# Patient Record
Sex: Male | Born: 1970 | Race: Black or African American | Hispanic: No | Marital: Married | Smoking: Current every day smoker
Health system: Southern US, Community
[De-identification: ages and names within clinical notes are randomized; demographics above are authoritative.]

## PROBLEM LIST (undated history)

## (undated) DIAGNOSIS — K289 Gastrojejunal ulcer, unspecified as acute or chronic, without hemorrhage or perforation: Secondary | ICD-10-CM

## (undated) DIAGNOSIS — M5430 Sciatica, unspecified side: Secondary | ICD-10-CM

## (undated) DIAGNOSIS — C801 Malignant (primary) neoplasm, unspecified: Secondary | ICD-10-CM

## (undated) DIAGNOSIS — F419 Anxiety disorder, unspecified: Secondary | ICD-10-CM

## (undated) HISTORY — PX: NO PAST SURGERIES: SHX2092

---

## 2016-03-27 ENCOUNTER — Encounter (HOSPITAL_COMMUNITY): Payer: Self-pay | Admitting: Emergency Medicine

## 2016-03-27 ENCOUNTER — Emergency Department (HOSPITAL_COMMUNITY)
Admission: EM | Admit: 2016-03-27 | Discharge: 2016-03-27 | Disposition: A | Discharge status: Home / Self Care | Payer: Self-pay | Attending: Emergency Medicine | Admitting: Emergency Medicine

## 2016-03-27 DIAGNOSIS — M5431 Sciatica, right side: Secondary | ICD-10-CM | POA: Insufficient documentation

## 2016-03-27 DIAGNOSIS — F17200 Nicotine dependence, unspecified, uncomplicated: Secondary | ICD-10-CM | POA: Insufficient documentation

## 2016-03-27 HISTORY — DX: Sciatica, unspecified side: M54.30

## 2016-03-27 HISTORY — DX: Gastrojejunal ulcer, unspecified as acute or chronic, without hemorrhage or perforation: K28.9

## 2016-03-27 MED ORDER — TRAMADOL HCL 50 MG PO TABS: 50.0000 mg | ORAL_TABLET | Freq: Four times a day (QID) | ORAL | 0 refills | Status: DC | PRN

## 2016-03-27 MED ORDER — PREDNISONE 20 MG PO TABS: 20.0000 mg | ORAL_TABLET | Freq: Every day | ORAL | 0 refills | Status: DC

## 2016-03-27 MED ORDER — PREDNISONE 20 MG PO TABS: 40.0000 mg | ORAL_TABLET | Freq: Every day | ORAL | 0 refills | Status: DC

## 2016-03-27 NOTE — ED Provider Notes (Signed)
[facility] DEPT Provider Note   CSN: [id number] Arrival date & time: 03/27/16  0930     History   Chief Complaint Chief Complaint  Patient presents with  . Back Pain    HPI Justin Randall is a 45 y.o. male.  HPI   45 year old male presents today with complaints of back pain. Patient reports a chronic history of lower back pain. Patient states he recently moved here from [place] approximately 3 weeks ago. He notes approximately one week ago started to have radiation of his low back pain into his right posterior leg. He reports symptoms radiate down into his posterior thigh just above his knee. He reports pain is significantly worse with flexing the hip, extending the leg. He notes this comes and goes, denies any focal neurological deficits, saddle anesthesia, changes in bowel or bladder habits, fever, IV drug use, weight loss, trauma, or any other back pain red flags. Patient reports he's been seen in the emergency room in [place], no follow-up with neurosurgery orthopedics or primary care for this. Patient has been trying ibuprofen and Tylenol with no significant relief symptoms.  Past Medical History:  Diagnosis Date  . Sciatica   . Ulcer of the stomach and intestine     There are no active problems to display for this patient.   History reviewed. No pertinent surgical history.    Home Medications    Prior to Admission medications   Medication Sig Start Date End Date Taking? Authorizing Provider  predniSONE (DELTASONE) 20 MG tablet Take 1 tablet (20 mg total) by mouth daily with breakfast. 03/27/16   Okey Regal, PA-C  predniSONE (DELTASONE) 20 MG tablet Take 2 tablets (40 mg total) by mouth daily with breakfast. 03/27/16   Okey Regal, PA-C  traMADol (ULTRAM) 50 MG tablet Take 1 tablet (50 mg total) by mouth every 6 (six) hours as needed. 03/27/16   Okey Regal, PA-C    Family History No family history on file.  Social History Social History  Substance Use Topics    . Smoking status: Current Every Day Smoker    Packs/day: 1.00  . Smokeless tobacco: Never Used  . Alcohol use No     Allergies   Review of patient's allergies indicates no known allergies.   Review of Systems Review of Systems  All other systems reviewed and are negative.  Physical Exam Updated Vital Signs BP 102/74 (BP Location: Left Arm)   Pulse 62   Temp 97.5 F (36.4 C) (Oral)   Resp 17   Ht '6\' 2"'$  (1.88 m)   Wt 79.4 kg   SpO2 97%   BMI 22.47 kg/m   Physical Exam  Constitutional: He is oriented to person, place, and time. He appears well-developed and well-nourished. No distress.  HENT:  Head: Normocephalic.  Neck: Normal range of motion. Neck supple.  Pulmonary/Chest: Effort normal.  Musculoskeletal: Normal range of motion. He exhibits tenderness. He exhibits no edema.  No C, T, or L spine tenderness to palpation. No obvious signs of trauma, deformity, infection, step-offs. Lung expansion normal. No scoliosis or kyphosis. Bilateral lower extremity strength 5 out of 5, sensation grossly intact, patellar reflexes 2+, pedal pulses 2+Straight leg positive  Ambulates with normal difficulty   Neurological: He is alert and oriented to person, place, and time.  Skin: Skin is warm and dry. He is not diaphoretic.  Psychiatric: He has a normal mood and affect. His behavior is normal. Judgment and thought content normal.  Nursing note and vitals reviewed.  ED Treatments / Results  Labs (all labs ordered are listed, but only abnormal results are displayed) Labs Reviewed - No data to display  EKG  EKG Interpretation None       Radiology No results found.  Procedures Procedures (including critical care time)  Medications Ordered in ED Medications - No data to display   Initial Impression / Assessment and Plan / ED Course  I have reviewed the triage vital signs and the nursing notes.  Pertinent labs & imaging results that were available during my care of  the patient were reviewed by me and considered in my medical decision making (see chart for details).  Clinical Course    Final Clinical Impressions(s) / ED Diagnoses   Final diagnoses:  Sciatica of right side   Labs:  Imaging:  Consults:  Therapeutics:  Discharge Meds:   Assessment/Plan:   45 year old male presents today with acute on chronic back pain; no red flags She does have sciatic symptoms, no neurological deficits. Patient will need neurosurgery follow-up. He'll be treated with above medications, he will be given strict return precautions. Patient verbalized understanding and agreement to today's plan had no further questions or concerns at time of discharge    New Prescriptions Discharge Medication List as of 03/27/2016 11:02 AM    START taking these medications   Details  !! predniSONE (DELTASONE) 20 MG tablet Take 1 tablet (20 mg total) by mouth daily with breakfast., Starting Wed 03/27/2016, Print    !! predniSONE (DELTASONE) 20 MG tablet Take 2 tablets (40 mg total) by mouth daily with breakfast., Starting Wed 03/27/2016, Print     !! - Potential duplicate medications found. Please discuss with provider.       Okey Regal, PA-C 03/27/16 1627    Tanna Furry, MD 04/02/16 [phone]

## 2016-03-27 NOTE — Discharge Instructions (Signed)
Please read attached information. If you experience any new or worsening signs or symptoms please return to the emergency room for evaluation. Please follow-up with your primary care provider or specialist as discussed. Please use medication prescribed only as directed and discontinue taking if you have any concerning signs or symptoms.   °

## 2016-03-27 NOTE — ED Triage Notes (Signed)
Pt c/o "sciatica pain" has hx of same-- radiates from right buttock to right knee. Pain with walking, and constant pain-- starts a new job Monday -- wants to be able to go to work.

## 2016-04-18 ENCOUNTER — Encounter (HOSPITAL_COMMUNITY): Payer: Self-pay | Admitting: *Deleted

## 2016-04-18 ENCOUNTER — Emergency Department (HOSPITAL_COMMUNITY)
Admission: EM | Admit: 2016-04-18 | Discharge: 2016-04-18 | Disposition: A | Discharge status: Home / Self Care | Payer: Self-pay | Attending: Emergency Medicine | Admitting: Emergency Medicine

## 2016-04-18 DIAGNOSIS — G8929 Other chronic pain: Secondary | ICD-10-CM | POA: Insufficient documentation

## 2016-04-18 DIAGNOSIS — F17200 Nicotine dependence, unspecified, uncomplicated: Secondary | ICD-10-CM | POA: Insufficient documentation

## 2016-04-18 DIAGNOSIS — M5431 Sciatica, right side: Secondary | ICD-10-CM | POA: Insufficient documentation

## 2016-04-18 MED ORDER — PREDNISONE 10 MG PO TABS: 20.0000 mg | ORAL_TABLET | Freq: Two times a day (BID) | ORAL | 0 refills | Status: DC

## 2016-04-18 MED ORDER — HYDROCODONE-ACETAMINOPHEN 5-325 MG PO TABS: 1.0000 | ORAL_TABLET | Freq: Four times a day (QID) | ORAL | 0 refills | Status: DC | PRN

## 2016-04-18 MED ORDER — METHOCARBAMOL 500 MG PO TABS: 500.0000 mg | ORAL_TABLET | Freq: Two times a day (BID) | ORAL | 0 refills | Status: DC

## 2016-04-18 MED ORDER — NAPROXEN 500 MG PO TABS: 500.0000 mg | ORAL_TABLET | Freq: Two times a day (BID) | ORAL | 0 refills | Status: DC

## 2016-04-18 MED ORDER — LIDOCAINE 5 % EX PTCH: 1.0000 | MEDICATED_PATCH | CUTANEOUS | 0 refills | Status: DC

## 2016-04-18 NOTE — ED Triage Notes (Signed)
Pt states R leg pain same as previous sciatica pain.

## 2016-04-18 NOTE — Discharge Instructions (Signed)
Take it easy, but do not lay around too much as this may make the stiffness worse. Take 500 mg of naproxen every 12 hours or 800 mg of ibuprofen every 8 hours for the next 3 days. Take these medications with food to avoid upset stomach. Robaxin is a muscle relaxer and may help loosen stiff muscles. Percocet for severe pain. Do not take the Robaxin or Percocet while driving or performing other dangerous activities. Follow-up with orthopedics as soon as possible.

## 2016-04-18 NOTE — ED Provider Notes (Signed)
[facility] DEPT Provider Note   CSN: [id number] Arrival date & time: 04/18/16  1112  By signing my name below, I, [name], attest that this documentation has been prepared under the direction and in the presence of Shawn C. Joy, PA-C. Electronically Signed: [name], ED Scribe. 04/18/16. 12:14 PM.   History   Chief Complaint Chief Complaint  Patient presents with  . Leg Pain    HPI HPI Comments: Justin Randall is a 45 y.o. male with a h/o sciatica who presents to the Emergency Department complaining of worsening, moderate, right hip pain onset a few days ago. He states that he has a h/o sciatica which typically presents similarly to his current pain, but is radiating into his right posterior leg more than usual. His pain worsens with movement and certain positions. He has taken Aleve w/o significant relief. Pt has not been evaluated by an orthopaedist for his chronic sciatica. Fever/chills, abdominal pain, neuro deficits, changes in bowel or bladder function, or any other associated symptoms at this time.    The history is provided by the patient. No language interpreter was used.    Past Medical History:  Diagnosis Date  . Sciatica   . Ulcer of the stomach and intestine     There are no active problems to display for this patient.   History reviewed. No pertinent surgical history.   Home Medications    Prior to Admission medications   Medication Sig Start Date End Date Taking? Authorizing Provider  HYDROcodone-acetaminophen (NORCO/VICODIN) 5-325 MG tablet Take 1 tablet by mouth every 6 (six) hours as needed. 04/18/16   Shawn C Joy, PA-C  lidocaine (LIDODERM) 5 % Place 1 patch onto the skin daily. Remove & Discard patch within 12 hours or as directed by MD 04/18/16   Lorayne Bender, PA-C  methocarbamol (ROBAXIN) 500 MG tablet Take 1 tablet (500 mg total) by mouth 2 (two) times daily. 04/18/16   Shawn C Joy, PA-C  naproxen (NAPROSYN) 500 MG tablet Take 1 tablet (500  mg total) by mouth 2 (two) times daily. 04/18/16   Shawn C Joy, PA-C  predniSONE (DELTASONE) 10 MG tablet Take 2 tablets (20 mg total) by mouth 2 (two) times daily with a meal. 04/18/16   Shawn C Joy, PA-C  traMADol (ULTRAM) 50 MG tablet Take 1 tablet (50 mg total) by mouth every 6 (six) hours as needed. 03/27/16   Okey Regal, PA-C    Family History No family history on file.  Social History Social History  Substance Use Topics  . Smoking status: Current Every Day Smoker    Packs/day: 1.00  . Smokeless tobacco: Never Used  . Alcohol use No     Allergies   Review of patient's allergies indicates no known allergies.   Review of Systems Review of Systems  Musculoskeletal: Positive for arthralgias and myalgias.  Skin: Negative for color change and wound.  Neurological: Negative for weakness and numbness.  All other systems reviewed and are negative.  Physical Exam Updated Vital Signs BP 123/78   Pulse 86   Temp 97.7 F (36.5 C) (Oral)   Resp 18   Ht '6\' 2"'$  (1.88 m)   Wt 79.4 kg   SpO2 98%   BMI 22.47 kg/m   Physical Exam  Constitutional: He appears well-developed and well-nourished. No distress.  HENT:  Head: Normocephalic and atraumatic.  Eyes: Conjunctivae are normal.  Neck: Neck supple.  Cardiovascular: Normal rate, regular rhythm and intact distal pulses.   Pulmonary/Chest:  Effort normal. No respiratory distress.  Abdominal: Soft. There is no tenderness. There is no guarding.  Musculoskeletal: Normal range of motion. He exhibits tenderness. He exhibits no edema.  TTP to posterior right leg and hip. Pain intensified with external rotation of the right hip. Full ROM in all extremities and spine. No midline spinal tenderness.   Lymphadenopathy:    He has no cervical adenopathy.  Neurological: He is alert. He has normal strength. No sensory deficit.  No sensory deficits. Strength 5/5 in all extremities. No gait disturbance. Coordination intact.   Skin: Skin is  warm and dry. He is not diaphoretic.  Psychiatric: He has a normal mood and affect. His behavior is normal.  Nursing note and vitals reviewed.   ED Treatments / Results  Labs (all labs ordered are listed, but only abnormal results are displayed) Labs Reviewed - No data to display  EKG  EKG Interpretation None       Radiology No results found.  Procedures Procedures  DIAGNOSTIC STUDIES: Oxygen Saturation is 98% on RA, normal by my interpretation.    COORDINATION OF CARE: 12:09 PM Discussed next steps with pt. Pt verbalized understanding and is agreeable with the plan.    Medications Ordered in ED Medications - No data to display   Initial Impression / Assessment and Plan / ED Course  I have reviewed the triage vital signs and the nursing notes.  Pertinent labs & imaging results that were available during my care of the patient were reviewed by me and considered in my medical decision making (see chart for details).  Clinical Course    Patient with chronic back pain.  No neuro or functional deficits. Patient is ambulatory.  No changes in bowel or bladder control.  No evidence of cauda equina.  No fever, night sweats, weight loss, h/o cancer, IVDA, no recent procedure to back. No urinary symptoms suggestive of UTI.  Supportive care and return precaution discussed. Appears safe for discharge at this time. Patient to follow-up with orthopedics. Resources given.   I personally performed the services described in this documentation, which was scribed in my presence. The recorded information has been reviewed and is accurate.  Final Clinical Impressions(s) / ED Diagnoses   Final diagnoses:  Sciatica of right side    New Prescriptions Discharge Medication List as of 04/18/2016 12:14 PM    START taking these medications   Details  HYDROcodone-acetaminophen (NORCO/VICODIN) 5-325 MG tablet Take 1 tablet by mouth every 6 (six) hours as needed., Starting Thu 04/18/2016, Print      lidocaine (LIDODERM) 5 % Place 1 patch onto the skin daily. Remove & Discard patch within 12 hours or as directed by MD, Starting Thu 04/18/2016, Print    methocarbamol (ROBAXIN) 500 MG tablet Take 1 tablet (500 mg total) by mouth 2 (two) times daily., Starting Thu 04/18/2016, Print    naproxen (NAPROSYN) 500 MG tablet Take 1 tablet (500 mg total) by mouth 2 (two) times daily., Starting Thu 04/18/2016, Print         Lorayne Bender, PA-C 04/20/16 [place], MD 04/21/16 1537

## 2016-05-30 ENCOUNTER — Emergency Department (HOSPITAL_COMMUNITY)
Admission: EM | Admit: 2016-05-30 | Discharge: 2016-05-30 | Disposition: A | Discharge status: Home / Self Care | Payer: Self-pay | Attending: Emergency Medicine | Admitting: Emergency Medicine

## 2016-05-30 ENCOUNTER — Emergency Department (HOSPITAL_COMMUNITY): Payer: Self-pay

## 2016-05-30 ENCOUNTER — Encounter (HOSPITAL_COMMUNITY): Payer: Self-pay

## 2016-05-30 DIAGNOSIS — F17200 Nicotine dependence, unspecified, uncomplicated: Secondary | ICD-10-CM | POA: Insufficient documentation

## 2016-05-30 DIAGNOSIS — M5416 Radiculopathy, lumbar region: Secondary | ICD-10-CM | POA: Insufficient documentation

## 2016-05-30 MED ORDER — HYDROCODONE-ACETAMINOPHEN 5-325 MG PO TABS: 1.0000 | ORAL_TABLET | Freq: Four times a day (QID) | ORAL | 0 refills | Status: DC | PRN

## 2016-05-30 MED ORDER — KETOROLAC TROMETHAMINE 60 MG/2ML IM SOLN
30.0000 mg | Freq: Once | INTRAMUSCULAR | Status: AC
  Administered 2016-05-30: 30 mg via INTRAMUSCULAR
  Filled 2016-05-30: qty 2

## 2016-05-30 MED ORDER — METHOCARBAMOL 500 MG PO TABS: 500.0000 mg | ORAL_TABLET | Freq: Two times a day (BID) | ORAL | 0 refills | Status: DC

## 2016-05-30 MED ORDER — NAPROXEN 500 MG PO TABS: 500.0000 mg | ORAL_TABLET | Freq: Two times a day (BID) | ORAL | 0 refills | Status: DC

## 2016-05-30 MED ORDER — PREDNISONE 10 MG (21) PO TBPK: 10.0000 mg | ORAL_TABLET | Freq: Every day | ORAL | 0 refills | Status: DC

## 2016-05-30 NOTE — ED Triage Notes (Signed)
Patient complains of ongoing lower back pain with radiation down right leg for months, has been seen in ED for same and taken prescriptions as prescribed. Some of the meds he couldn't afford so never purchased.
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
## 2016-05-30 NOTE — Discharge Instructions (Signed)
Naproxen for pain and inflammation. Norco for severe pain only. Take prednisone as prescribed until all gone. Take Robaxin for muscle spasms. Start doing back exercises as described below. Follow-up with a primary care doctor.

## 2016-05-30 NOTE — ED Notes (Signed)
Papers and prescriptions reviewed and pt. Verbalizes understanding.

## 2016-06-14 ENCOUNTER — Emergency Department (HOSPITAL_COMMUNITY)
Admission: EM | Admit: 2016-06-14 | Discharge: 2016-06-14 | Disposition: A | Discharge status: Home / Self Care | Payer: Self-pay | Attending: Emergency Medicine | Admitting: Emergency Medicine

## 2016-06-14 ENCOUNTER — Encounter (HOSPITAL_COMMUNITY): Payer: Self-pay | Admitting: *Deleted

## 2016-06-14 DIAGNOSIS — F17200 Nicotine dependence, unspecified, uncomplicated: Secondary | ICD-10-CM | POA: Insufficient documentation

## 2016-06-14 DIAGNOSIS — M79604 Pain in right leg: Secondary | ICD-10-CM | POA: Insufficient documentation

## 2016-06-14 DIAGNOSIS — M25551 Pain in right hip: Secondary | ICD-10-CM | POA: Insufficient documentation

## 2016-06-14 MED ORDER — PREDNISONE 10 MG (21) PO TBPK: 10.0000 mg | ORAL_TABLET | Freq: Every day | ORAL | 0 refills | Status: DC

## 2016-06-14 MED ORDER — METHYLPREDNISOLONE SODIUM SUCC 125 MG IJ SOLR: 125.0000 mg | Freq: Once | INTRAMUSCULAR | Status: DC

## 2016-06-14 MED ORDER — CYCLOBENZAPRINE HCL 10 MG PO TABS: 10.0000 mg | ORAL_TABLET | Freq: Three times a day (TID) | ORAL | 0 refills | Status: DC | PRN

## 2016-06-14 MED ORDER — IBUPROFEN 800 MG PO TABS: 800.0000 mg | ORAL_TABLET | Freq: Three times a day (TID) | ORAL | 0 refills | Status: DC

## 2016-06-14 MED ORDER — KETOROLAC TROMETHAMINE 60 MG/2ML IM SOLN
60.0000 mg | Freq: Once | INTRAMUSCULAR | Status: AC
  Administered 2016-06-14: 60 mg via INTRAMUSCULAR
  Filled 2016-06-14: qty 2

## 2016-06-14 MED ORDER — METHYLPREDNISOLONE SODIUM SUCC 125 MG IJ SOLR
125.0000 mg | Freq: Once | INTRAMUSCULAR | Status: AC
  Administered 2016-06-14: 125 mg via INTRAMUSCULAR
  Filled 2016-06-14: qty 2

## 2016-06-14 NOTE — ED Provider Notes (Signed)
[facility] DEPT Provider Note   CSN: [id number] Arrival date & time: 06/14/16  1022  By signing my name below, I, Justin Randall, attest that this documentation has been prepared under the direction and in the presence of [employer], PA-C.  Electronically Signed: Rayna Randall, ED Scribe. 06/14/16. 11:02 AM.   History   Chief Complaint Chief Complaint  Patient presents with  . Leg Pain  . Back Pain    HPI HPI Comments: Justin Randall is a 45 y.o. male who presents to the Emergency Department complaining of chronic, worsening, sharp, right lower back painAnd hip pain onset years ago. Pt was seen on 05/30/2016 for sciatic back pain and d/c with a course of prednisone which provided mild short term relief. He states his pain has progressively worsened and states it now radiates down his RLE and is a constant pain which is abnormal for his chronic symptoms. Pt reports associated sleep disturbance due to his pain. He has taken tylenol and ibuprofen w/o significant relief. Pt is currently unemployed and does not have insurance coverage and notes he cannot fill his prescriptions due to a lack of funds. He has taken Flexeril in the past which he states provides significant relief of his symptoms. Pt states he ambulates with a cane but did ambulate in the ED w/o a cane. Pt is a smoker. He denies a h/o IVDA or CA.  He denies bowel or bladder incontinence, urinary symptoms, CP, SOB, recent weight changes, abdominal pain, nausea, vomiting and saddle anaesthesia.   The history is provided by the patient. No language interpreter was used.    Past Medical History:  Diagnosis Date  . Sciatica   . Ulcer of the stomach and intestine     There are no active problems to display for this patient.   History reviewed. No pertinent surgical history.     Home Medications    Prior to Admission medications   Medication Sig Start Date End Date Taking? Authorizing Provider  cyclobenzaprine  (FLEXERIL) 10 MG tablet Take 1 tablet (10 mg total) by mouth 3 (three) times daily as needed for muscle spasms. 06/14/16   Frederica Kuster, PA-C  HYDROcodone-acetaminophen (NORCO) 5-325 MG tablet Take 1 tablet by mouth every 6 (six) hours as needed for moderate pain. 05/30/16   Tatyana Kirichenko, PA-C  ibuprofen (ADVIL,MOTRIN) 800 MG tablet Take 1 tablet (800 mg total) by mouth 3 (three) times daily. 06/14/16   Wanell Lorenzi M Colonel Krauser, PA-C  lidocaine (LIDODERM) 5 % Place 1 patch onto the skin daily. Remove & Discard patch within 12 hours or as directed by MD 04/18/16   Lorayne Bender, PA-C  methocarbamol (ROBAXIN) 500 MG tablet Take 1 tablet (500 mg total) by mouth 2 (two) times daily. 05/30/16   Tatyana Kirichenko, PA-C  naproxen (NAPROSYN) 500 MG tablet Take 1 tablet (500 mg total) by mouth 2 (two) times daily. 05/30/16   Tatyana Kirichenko, PA-C  predniSONE (STERAPRED UNI-PAK 21 TAB) 10 MG (21) TBPK tablet Take 1 tablet (10 mg total) by mouth daily. Take 6 tabs by mouth daily  for 2 days, then 5 tabs for 2 days, then 4 tabs for 2 days, then 3 tabs for 2 days, 2 tabs for 2 days, then 1 tab by mouth daily for 2 days 06/14/16   Bea Graff Daryll Spisak, PA-C  traMADol (ULTRAM) 50 MG tablet Take 1 tablet (50 mg total) by mouth every 6 (six) hours as needed. 03/27/16   Okey Regal, PA-C  Family History No family history on file.  Social History Social History  Substance Use Topics  . Smoking status: Current Every Day Smoker    Packs/day: 1.00  . Smokeless tobacco: Never Used  . Alcohol use No     Allergies   Patient has no known allergies.   Review of Systems Review of Systems  Constitutional: Negative for chills, fever and unexpected weight change.  Respiratory: Negative for shortness of breath.   Cardiovascular: Negative for chest pain.  Gastrointestinal: Negative for abdominal pain, nausea and vomiting.  Genitourinary: Negative for difficulty urinating, dysuria, frequency and hematuria.    Musculoskeletal: Positive for arthralgias, back pain and myalgias.  Neurological: Negative for numbness.  Psychiatric/Behavioral: Positive for sleep disturbance.    Physical Exam Updated Vital Signs BP 125/100 (BP Location: Right Arm)   Pulse 79   Temp 98.3 F (36.8 C) (Oral)   Resp 18   Ht '6\' 2"'$  (1.88 m)   Wt 79.4 kg   SpO2 99%   BMI 22.47 kg/m   Physical Exam  Constitutional: He appears well-developed and well-nourished. No distress.  HENT:  Head: Normocephalic and atraumatic.  Mouth/Throat: Oropharynx is clear and moist. No oropharyngeal exudate.  Eyes: Conjunctivae are normal. Pupils are equal, round, and reactive to light. Right eye exhibits no discharge. Left eye exhibits no discharge. No scleral icterus.  Neck: Normal range of motion. Neck supple. No thyromegaly present.  Cardiovascular: Normal rate, regular rhythm, normal heart sounds and intact distal pulses.  Exam reveals no gallop and no friction rub.   No murmur heard. Pulmonary/Chest: Effort normal and breath sounds normal. No stridor. No respiratory distress. He has no wheezes. He has no rales.  Abdominal: Soft. Bowel sounds are normal. He exhibits no distension. There is no tenderness. There is no rebound and no guarding.  Musculoskeletal: He exhibits tenderness. He exhibits no edema.       Right hip: He exhibits tenderness. He exhibits no bony tenderness.       Legs: R LE: Tenderness to right hip over gluteal muscle, no anterior hip tenderness; no tenderness to palpation to lower extremity; no edema, warmth, or varicosities present; lower extremities are symmetrical; normal sensation, 5/5 strength lower extremities; patient is ambulatory  Lymphadenopathy:    He has no cervical adenopathy.  Neurological: He is alert. Coordination normal.  Skin: Skin is warm and dry. No rash noted. He is not diaphoretic. No pallor.  Psychiatric: He has a normal mood and affect.  Nursing note and vitals reviewed.  ED Treatments  / Results  Labs (all labs ordered are listed, but only abnormal results are displayed) Labs Reviewed - No data to display  EKG  EKG Interpretation None       Radiology No results found.  Procedures Procedures  DIAGNOSTIC STUDIES: Oxygen Saturation is 99% on RA, normal by my interpretation.    COORDINATION OF CARE: 11:02 AM Discussed next steps with pt. Pt verbalized understanding and is agreeable with the plan.    Medications Ordered in ED Medications  ketorolac (TORADOL) injection 60 mg (60 mg Intramuscular Given 06/14/16 1116)  methylPREDNISolone sodium succinate (SOLU-MEDROL) 125 mg/2 mL injection 125 mg (125 mg Intramuscular Given 06/14/16 1117)     Initial Impression / Assessment and Plan / ED Course  I have reviewed the triage vital signs and the nursing notes.  Pertinent labs & imaging results that were available during my care of the patient were reviewed by me and considered in my medical decision making (see  chart for details).  Clinical Course     Well's Score -2. Doubt DVT as symptoms are chronic and unprovoked by palpation. Only present with ambulation and putting weight on the leg. No neurological deficits and normal neuro exam.  Patient is ambulatory.  No loss of bowel or bladder control.  No concern for cauda equina.  No fever, night sweats, weight loss, h/o cancer, IVDA, no recent procedure to back. No urinary symptoms suggestive of UTI.  Patient discharged home with Flexeril, ibuprofen, Sterapred taper. Patient advised not to begin ibuprofen and Sterapred until tomorrow, as patient received Toradol and Solu-Medrol in the ED. Supportive care and return precaution discussed. I consulted case management to arrange for patient to follow up at [facility] for primary care, pharmacy, and financial help with orange card. Patient also given follow-up to orthopedics when his orange card allows. Patient understands and agrees with plan. Appears safe for  discharge at this time.   I personally performed the services described in this documentation, which was scribed in my presence. The recorded information has been reviewed and is accurate.  Final Clinical Impressions(s) / ED Diagnoses   Final diagnoses:  Right hip pain  Right leg pain    New Prescriptions Discharge Medication List as of 06/14/2016 11:51 AM    START taking these medications   Details  cyclobenzaprine (FLEXERIL) 10 MG tablet Take 1 tablet (10 mg total) by mouth 3 (three) times daily as needed for muscle spasms., Starting Fri 06/14/2016, Print    ibuprofen (ADVIL,MOTRIN) 800 MG tablet Take 1 tablet (800 mg total) by mouth 3 (three) times daily., Starting Fri 06/14/2016, Print         Frederica Kuster, PA-C 06/14/16 [place], MD 06/15/16 [phone]

## 2016-06-14 NOTE — ED Triage Notes (Signed)
Pt states hx of sciatica and lower back pain.  States seen here 3 weeks ago and was tx with muscle relaxers.  This pain is different. Pt states that last time he was here he was accused of abusing opiates and he found it quite offensive.

## 2016-06-14 NOTE — Discharge Instructions (Signed)
Medications: Prednisone, ibuprofen, Flexeril  Treatment: Take prednisone as prescribed. Take ibuprofen every 8 hours as needed for pain. Do not begin taking prednisone or ibuprofen until tomorrow. Take Flexeril 3 times daily as needed for muscle pain and spasms. Use moist heat and ice alternating 20 minutes on, 20 minutes off.  Follow-up: Please follow-up with [facility] as outlined above to establish care with a primary care provider, help with pharmacy and your [facility] card. Please follow-up with the orthopedic doctor, Dr. Erlinda Hong, for further evaluation and treatment of your symptoms. Please return to the emergency department if you develop any new or worsening symptoms.

## 2016-06-17 MED FILL — IBUPROFEN 800 MG TABLET: 800 | 7 days supply | Qty: 21 | Fill #0

## 2016-06-17 MED FILL — ?CYCLOBENZAPRINE 10 MG TABL: 10 | 7 days supply | Qty: 21 | Fill #0

## 2016-06-17 MED FILL — ?PREDNISONE 10 MG TABLET: 10 | 12 days supply | Qty: 42 | Fill #0

## 2016-06-24 ENCOUNTER — Ambulatory Visit: Payer: Self-pay | Attending: Internal Medicine | Admitting: Physician Assistant

## 2016-06-24 ENCOUNTER — Encounter: Payer: Self-pay | Admitting: Physician Assistant

## 2016-06-24 VITALS — BP 131/87 | HR 77 | Temp 98.1°F | Resp 16 | Wt 190.0 lb

## 2016-06-24 DIAGNOSIS — G8929 Other chronic pain: Secondary | ICD-10-CM

## 2016-06-24 DIAGNOSIS — R202 Paresthesia of skin: Secondary | ICD-10-CM | POA: Insufficient documentation

## 2016-06-24 DIAGNOSIS — Z5189 Encounter for other specified aftercare: Secondary | ICD-10-CM | POA: Insufficient documentation

## 2016-06-24 DIAGNOSIS — M62838 Other muscle spasm: Secondary | ICD-10-CM | POA: Insufficient documentation

## 2016-06-24 DIAGNOSIS — M5440 Lumbago with sciatica, unspecified side: Secondary | ICD-10-CM

## 2016-06-24 DIAGNOSIS — M5441 Lumbago with sciatica, right side: Secondary | ICD-10-CM | POA: Insufficient documentation

## 2016-06-24 MED ORDER — METHOCARBAMOL 500 MG PO TABS: 1000.0000 mg | ORAL_TABLET | Freq: Four times a day (QID) | ORAL | 0 refills | Status: DC

## 2016-06-24 MED ORDER — KETOROLAC TROMETHAMINE 10 MG PO TABS: ORAL_TABLET | ORAL | 0 refills | Status: DC

## 2016-06-24 MED FILL — KETOROLAC 10 MG TABLET: 10 | 21 days supply | Qty: 21 | Fill #0

## 2016-06-24 MED FILL — METHOCARBAMOL 500 MG TABLET: 500 | 30 days supply | Qty: 180 | Fill #0

## 2016-06-24 NOTE — Progress Notes (Signed)
Justin Randall, is a 45 y.o. male  ELF:[id number]  HEN:[id number]  DOB - [birth year]  Subjective:  Chief Complaint and HPI: Justin Randall is a 45 y.o. male here today to establish care and for a follow up visit after being seen in the ED multiple times over the last couple of months for LBP with worsening R radiculopthy.  He has had chronic LBP for about 10 years.  Usually the pain will flare up and last a few days then resolve with medications.  Over the last 3 months, he has developed new symptoms of R leg pain and paresthesias posteriorly.  NKI that precipitated the pain 10 years ago and nothing precipitated the change in pain he is having now.  He doesn't want to have to take narcotics for the pain.  Pain has improved with prednisone and toradol.  He does not have any health problems that he knows of.  The newer symptoms and pain and paresthesias in his R leg are very concerning to him.  No FH diabetes/htn. Marland Kitchen   ED/Hospital notes reviewed.     ROS:   Constitutional:  No f/c, No night sweats, No unexplained weight loss. EENT:  No vision changes, No blurry vision, No hearing changes. No mouth, throat, or ear problems.  Respiratory: No cough, No SOB Cardiac: No CP, no palpitations GI:  No abd pain, No N/V/D. GU: No Urinary s/sx Musculoskeletal: + back and leg pain Neuro: No headache, no dizziness, no motor weakness.  Skin: No rash Endocrine:  No polydipsia. No polyuria.  Psych: Denies SI/HI  No problems updated.  ALLERGIES: No Known Allergies  PAST MEDICAL HISTORY: Past Medical History:  Diagnosis Date  . Sciatica   . Ulcer of the stomach and intestine     MEDICATIONS AT HOME: Prior to Admission medications   Medication Sig Start Date End Date Taking? Authorizing Provider  ketorolac (TORADOL) 10 MG tablet Take 3 X daily with food X 7 days 06/24/16   Argentina Donovan, PA-C  lidocaine (LIDODERM) 5 % Place 1 patch onto the skin daily. Remove & Discard patch within 12 hours or  as directed by MD Patient not taking: Reported on 06/24/2016 04/18/16   Shawn C Joy, PA-C  methocarbamol (ROBAXIN) 500 MG tablet Take 2 tablets (1,000 mg total) by mouth 4 (four) times daily. X 10 days then prn spasm 06/24/16   Argentina Donovan, PA-C     Objective:  EXAM:   Vitals:   06/24/16 1035  BP: 131/87  Pulse: 77  Resp: 16  Temp: 98.1 F (36.7 C)  TempSrc: Oral  SpO2: 99%  Weight: 190 lb (86.2 kg)    General appearance : A&OX3. NAD. Non-toxic-appearing HEENT: Atraumatic and Normocephalic.  PERRLA. EOM intact.  TM clear B. Mouth-MMM, post pharynx WNL w/o erythema, No PND. Neck: supple, no JVD. No cervical lymphadenopathy. No thyromegaly Chest/Lungs:  Breathing-non-labored, Good air entry bilaterally, breath sounds normal without rales, rhonchi, or wheezing  CVS: S1 S2 regular, no murmurs, gallops, rubs  Back:  80% normal S&ROM with flexion and extension.  No TTP over spinus processes.  Some TTP over R SI joint.  +SLR on R.  Ankle DTR on R < Ankle DTE on R.  Otherwise reflexes symmetrical and no Babinski.  Extremities: Bilateral Lower Ext shows no edema, both legs are warm to touch with = pulse throughout Neurology:  CN II-XII grossly intact, Non focal.   Psych:  TP linear. J/I WNL. Normal speech. Appropriate eye contact and  affect.  Skin:  No Rash  Data Review No results found for: HGBA1C   Assessment & Plan   1. Chronic right-sided low back pain with sciatica, sciatica laterality unspecified Xrays reviewed.  Concern for impingement. - MR Lumbar Spine Wo Contrast; Future - ketorolac (TORADOL) 10 MG tablet; Take 3 X daily with food X 7 days  Dispense: 21 tablet; Refill: 0  2. Muscle spasm - methocarbamol (ROBAXIN) 500 MG tablet; Take 2 tablets (1,000 mg total) by mouth 4 (four) times daily. X 10 days then prn spasm  Dispense: 180 tablet; Refill: 0  3. Paresthesia of right leg - MR Lumbar Spine Wo Contrast; Future  Patient have been counseled extensively about  nutrition and exercise  Return in about 1 week (around 07/01/2016) for establish care; f/up L-S MRI and fill out forms if needed.  The patient was given clear instructions to go to ER or return to medical center if symptoms don't improve, worsen or new problems develop. The patient verbalized understanding. The patient was told to call to get lab results if they haven't heard anything in the next week.     Freeman Caldron, PA-C [facility], [place]   06/24/2016, 11:15 AM

## 2016-06-24 NOTE — Progress Notes (Signed)
Pt is in the office today for a ED follow up Pt states his pain level is a 10 Pt states he is not able to work because of his condition Pt states he is going to be getting a call from disability to see what will be the next step

## 2016-07-03 ENCOUNTER — Ambulatory Visit (HOSPITAL_COMMUNITY)
Admission: RE | Admit: 2016-07-03 | Discharge: 2016-07-03 | Disposition: A | Discharge status: Home / Self Care | Payer: Self-pay | Source: Ambulatory Visit | Attending: Physician Assistant | Admitting: Physician Assistant

## 2016-07-03 ENCOUNTER — Encounter (HOSPITAL_COMMUNITY): Payer: Self-pay

## 2016-07-03 ENCOUNTER — Telehealth: Payer: Self-pay | Admitting: General Practice

## 2016-07-03 DIAGNOSIS — G8929 Other chronic pain: Secondary | ICD-10-CM

## 2016-07-03 DIAGNOSIS — M5440 Lumbago with sciatica, unspecified side: Principal | ICD-10-CM

## 2016-07-03 DIAGNOSIS — R202 Paresthesia of skin: Secondary | ICD-10-CM

## 2016-07-03 NOTE — Telephone Encounter (Signed)
Patient went for MRI but wasn't done because patient having to much pain. Could not be still.   MRI nurse advised patient might need RX for pain to perform MRI.  Patient also states that RX prescribed by Nurse Practitioner are not help.  Please follow up with patient.

## 2016-07-04 ENCOUNTER — Other Ambulatory Visit: Payer: Self-pay | Admitting: Physician Assistant

## 2016-07-04 MED ORDER — TRAMADOL HCL 50 MG PO TABS: 50.0000 mg | ORAL_TABLET | Freq: Three times a day (TID) | ORAL | 0 refills | Status: DC | PRN

## 2016-07-04 MED ORDER — ALPRAZOLAM 1 MG PO TABS: ORAL_TABLET | ORAL | 0 refills | Status: DC

## 2016-07-04 MED FILL — traMADol HCL 50 MG TABS: 50 | 10 days supply | Qty: 30 | Fill #0

## 2016-07-04 NOTE — Telephone Encounter (Signed)
I called and left a message for him.  I will prescribe him something for the MRI to help him relax for the test.  I will leave the prescription with Jay'a and the patient can pick it up at his convenience.

## 2016-07-11 ENCOUNTER — Ambulatory Visit: Payer: Self-pay | Attending: Internal Medicine | Admitting: Internal Medicine

## 2016-07-11 ENCOUNTER — Encounter: Payer: Self-pay | Admitting: Internal Medicine

## 2016-07-11 VITALS — BP 119/83 | HR 112 | Temp 98.4°F | Ht 74.0 in | Wt 174.0 lb

## 2016-07-11 DIAGNOSIS — F4321 Adjustment disorder with depressed mood: Secondary | ICD-10-CM | POA: Insufficient documentation

## 2016-07-11 DIAGNOSIS — Z79899 Other long term (current) drug therapy: Secondary | ICD-10-CM | POA: Insufficient documentation

## 2016-07-11 DIAGNOSIS — G8929 Other chronic pain: Secondary | ICD-10-CM

## 2016-07-11 DIAGNOSIS — M5440 Lumbago with sciatica, unspecified side: Secondary | ICD-10-CM | POA: Insufficient documentation

## 2016-07-11 DIAGNOSIS — K5900 Constipation, unspecified: Secondary | ICD-10-CM | POA: Insufficient documentation

## 2016-07-11 MED ORDER — HYDROXYZINE HCL 25 MG PO TABS
25.0000 mg | ORAL_TABLET | Freq: Three times a day (TID) | ORAL | 3 refills | Status: DC | PRN
Start: 2016-07-11 — End: 2017-01-15

## 2016-07-11 MED ORDER — AMITRIPTYLINE HCL 50 MG PO TABS: 50.0000 mg | ORAL_TABLET | Freq: Every day | ORAL | 3 refills | Status: DC

## 2016-07-11 MED FILL — AMITRIPTYLINE HCL 50 MG TAB: 50 | 30 days supply | Qty: 30 | Fill #0

## 2016-07-11 MED FILL — hydrOXYzine HCL 25 MG TABS: 25 | 30 days supply | Qty: 90 | Fill #0

## 2016-07-11 NOTE — Patient Instructions (Signed)
Sciatica Sciatica is pain, numbness, weakness, or tingling along the path of the sciatic nerve. The sciatic nerve starts in the lower back and runs down the back of each leg. The nerve controls the muscles in the lower leg and in the back of the knee. It also provides feeling (sensation) to the back of the thigh, the lower leg, and the sole of the foot. Sciatica is a symptom of another medical condition that pinches or puts pressure on the sciatic nerve. Generally, sciatica only affects one side of the body. Sciatica usually goes away on its own or with treatment. In some cases, sciatica may keep coming back (recur). What are the causes? This condition is caused by pressure on the sciatic nerve, or pinching of the sciatic nerve. This may be the result of:  A disk in between the bones of the spine (vertebrae) bulging out too far (herniated disk).  Age-related changes in the spinal disks (degenerative disk disease).  A pain disorder that affects a muscle in the buttock (piriformis syndrome).  Extra bone growth (bone spur) near the sciatic nerve.  An injury or break (fracture) of the pelvis.  Pregnancy.  Tumor (rare).  What increases the risk? The following factors may make you more likely to develop this condition:  Playing sports that place pressure or stress on the spine, such as football or weight lifting.  Having poor strength and flexibility.  A history of back injury.  A history of back surgery.  Sitting for long periods of time.  Doing activities that involve repetitive bending or lifting.  Obesity.  What are the signs or symptoms? Symptoms can vary from mild to very severe, and they may include:  Any of these problems in the lower back, leg, hip, or buttock: ? Mild tingling or dull aches. ? Burning sensations. ? Sharp pains.  Numbness in the back of the calf or the sole of the foot.  Leg weakness.  Severe back pain that makes movement difficult.  These  symptoms may get worse when you cough, sneeze, or laugh, or when you sit or stand for long periods of time. Being overweight may also make symptoms worse. In some cases, symptoms may recur over time. How is this diagnosed? This condition may be diagnosed based on:  Your symptoms.  A physical exam. Your health care provider may ask you to do certain movements to check whether those movements trigger your symptoms.  You may have tests, including: ? Blood tests. ? X-rays. ? MRI. ? CT scan.  How is this treated? In many cases, this condition improves on its own, without any treatment. However, treatment may include:  Reducing or modifying physical activity during periods of pain.  Exercising and stretching to strengthen your abdomen and improve the flexibility of your spine.  Icing and applying heat to the affected area.  Medicines that help: ? To relieve pain and swelling. ? To relax your muscles.  Injections of medicines that help to relieve pain, irritation, and inflammation around the sciatic nerve (steroids).  Surgery.  Follow these instructions at home: Medicines  Take over-the-counter and prescription medicines only as told by your health care provider.  Do not drive or operate heavy machinery while taking prescription pain medicine. Managing pain  If directed, apply ice to the affected area. ? Put ice in a plastic bag. ? Place a towel between your skin and the bag. ? Leave the ice on for 20 minutes, 2-3 times a day.  After icing, apply   heat to the affected area before you exercise or as often as told by your health care provider. Use the heat source that your health care provider recommends, such as a moist heat pack or a heating pad. ? Place a towel between your skin and the heat source. ? Leave the heat on for 20-30 minutes. ? Remove the heat if your skin turns bright red. This is especially important if you are unable to feel pain, heat, or cold. You may have a  greater risk of getting burned. Activity  Return to your normal activities as told by your health care provider. Ask your health care provider what activities are safe for you. ? Avoid activities that make your symptoms worse.  Take brief periods of rest throughout the day. Resting in a lying or standing position is usually better than sitting to rest. ? When you rest for longer periods, mix in some mild activity or stretching between periods of rest. This will help to prevent stiffness and pain. ? Avoid sitting for long periods of time without moving. Get up and move around at least one time each hour.  Exercise and stretch regularly, as told by your health care provider.  Do not lift anything that is heavier than 10 lb (4.5 kg) while you have symptoms of sciatica. When you do not have symptoms, you should still avoid heavy lifting, especially repetitive heavy lifting.  When you lift objects, always use proper lifting technique, which includes: ? Bending your knees. ? Keeping the load close to your body. ? Avoiding twisting. General instructions  Use good posture. ? Avoid leaning forward while sitting. ? Avoid hunching over while standing.  Maintain a healthy weight. Excess weight puts extra stress on your back and makes it difficult to maintain good posture.  Wear supportive, comfortable shoes. Avoid wearing high heels.  Avoid sleeping on a mattress that is too soft or too hard. A mattress that is firm enough to support your back when you sleep may help to reduce your pain.  Keep all follow-up visits as told by your health care provider. This is important. Contact a health care provider if:  You have pain that wakes you up when you are sleeping.  You have pain that gets worse when you lie down.  Your pain is worse than you have experienced in the past.  Your pain lasts longer than 4 weeks.  You experience unexplained weight loss. Get help right away if:  You lose control  of your bowel or bladder (incontinence).  You have: ? Weakness in your lower back, pelvis, buttocks, or legs that gets worse. ? Redness or swelling of your back. ? A burning sensation when you urinate. This information is not intended to replace advice given to you by your health care provider. Make sure you discuss any questions you have with your health care provider. Document Released: 07/16/2001 Document Revised: 12/26/2015 Document Reviewed: 03/31/2015 Elsevier Interactive Patient Education  2017 [employer] Back Pain, Adult Back pain is very common in adults.The cause of back pain is rarely dangerous and the pain often gets better over time.The cause of your back pain may not be known. Some common causes of back pain include:  Strain of the muscles or ligaments supporting the spine.  Wear and tear (degeneration) of the spinal disks.  Arthritis.  Direct injury to the back.  For many people, back pain may return. Since back pain is rarely dangerous, most people can learn to manage this condition   on their own. Follow these instructions at home: Watch your back pain for any changes. The following actions may help to lessen any discomfort you are feeling:  Remain active. It is stressful on your back to sit or stand in one place for long periods of time. Do not sit, drive, or stand in one place for more than 30 minutes at a time. Take short walks on even surfaces as soon as you are able.Try to increase the length of time you walk each day.  Exercise regularly as directed by your health care provider. Exercise helps your back heal faster. It also helps avoid future injury by keeping your muscles strong and flexible.  Do not stay in bed.Resting more than 1-2 days can delay your recovery.  Pay attention to your body when you bend and lift. The most comfortable positions are those that put less stress on your recovering back. Always use proper lifting techniques,  including: ? Bending your knees. ? Keeping the load close to your body. ? Avoiding twisting.  Find a comfortable position to sleep. Use a firm mattress and lie on your side with your knees slightly bent. If you lie on your back, put a pillow under your knees.  Avoid feeling anxious or stressed.Stress increases muscle tension and can worsen back pain.It is important to recognize when you are anxious or stressed and learn ways to manage it, such as with exercise.  Take medicines only as directed by your health care provider. Over-the-counter medicines to reduce pain and inflammation are often the most helpful.Your health care provider may prescribe muscle relaxant drugs.These medicines help dull your pain so you can more quickly return to your normal activities and healthy exercise.  Apply ice to the injured area: ? Put ice in a plastic bag. ? Place a towel between your skin and the bag. ? Leave the ice on for 20 minutes, 2-3 times a day for the first 2-3 days. After that, ice and heat may be alternated to reduce pain and spasms.  Maintain a healthy weight. Excess weight puts extra stress on your back and makes it difficult to maintain good posture.  Contact a health care provider if:  You have pain that is not relieved with rest or medicine.  You have increasing pain going down into the legs or buttocks.  You have pain that does not improve in one week.  You have night pain.  You lose weight.  You have a fever or chills. Get help right away if:  You develop new bowel or bladder control problems.  You have unusual weakness or numbness in your arms or legs.  You develop nausea or vomiting.  You develop abdominal pain.  You feel faint. This information is not intended to replace advice given to you by your health care provider. Make sure you discuss any questions you have with your health care provider. Document Released: 07/22/2005 Document Revised: 11/30/2015 Document  Reviewed: 11/23/2013 Elsevier Interactive Patient Education  2017 Else[employer] Inc.  

## 2016-07-11 NOTE — Progress Notes (Signed)
Justin Randall, is a 45 y.o. male  QVZ:[id number]  PIR:[id number]  DOB - 13-Oct-1970  Chief Complaint  Patient presents with  . Back Pain    sciatic nerve- right leg  . Constipation    since taking the tramadol      Subjective:   Justin Randall is a 45 y.o. male with history of chronic low back pain and sciatica here today for a follow up visit. He has had chronic LBP for about 10 years. Usually the pain will flare up and last a few days then resolve with medications. Over the last 3 months, he has developed new symptoms of R leg pain and paresthesias posteriorly. He denies bowel or bladder incontinence, urinary symptoms, CP, SOB, recent weight changes, abdominal pain, nausea, vomiting and saddle anaesthesia. Patient is finding it difficult to adjust to his medical condition especially because of so much pain and inability to be engaged in any gainful employment. Yet to do MRI that was ordered.   No problems updated.  ALLERGIES: No Known Allergies  PAST MEDICAL HISTORY: Past Medical History:  Diagnosis Date  . Sciatica   . Ulcer of the stomach and intestine     MEDICATIONS AT HOME: Prior to Admission medications   Medication Sig Start Date End Date Taking? Authorizing Provider  methocarbamol (ROBAXIN) 500 MG tablet Take 2 tablets (1,000 mg total) by mouth 4 (four) times daily. X 10 days then prn spasm 06/24/16  Yes Dionne Bucy McClung, PA-C  amitriptyline (ELAVIL) 50 MG tablet Take 1 tablet (50 mg total) by mouth at bedtime. 07/11/16   Tresa Garter, MD  hydrOXYzine (ATARAX/VISTARIL) 25 MG tablet Take 1 tablet (25 mg total) by mouth 3 (three) times daily as needed. 07/11/16   Justyn Langham Essie Christine, MD  lidocaine (LIDODERM) 5 % Place 1 patch onto the skin daily. Remove & Discard patch within 12 hours or as directed by MD Patient not taking: Reported on 07/11/2016 04/18/16   Shawn C Joy, PA-C   Objective:   Vitals:   07/11/16 1420  BP: 119/83  Pulse: (!) 112  Temp: 98.4 F (36.9  C)  TempSrc: Oral  SpO2: 99%  Weight: 174 lb (78.9 kg)  Height: '6\' 2"'$  (1.88 m)   Exam General appearance : Awake, alert, not in any distress. Speech Clear. Not toxic looking HEENT: Atraumatic and Normocephalic, pupils equally reactive to light and accomodation Neck: Supple, no JVD. No cervical lymphadenopathy.  Chest: Good air entry bilaterally, no added sounds  CVS: S1 S2 regular, no murmurs.  Abdomen: Bowel sounds present, Non tender and not distended with no gaurding, rigidity or rebound. Extremities: B/L Lower Ext shows no edema, both legs are warm to touch Neurology: Awake alert, and oriented X 3, CN II-XII intact, Non focal Skin: No Rash  Data Review No results found for: HGBA1C  Assessment & Plan   1. Chronic right-sided low back pain with sciatica, sciatica laterality unspecified Start - amitriptyline (ELAVIL) 50 MG tablet; Take 1 tablet (50 mg total) by mouth at bedtime.  Dispense: 30 tablet; Refill: 3  2. Adjustment disorder with depressed mood  - amitriptyline (ELAVIL) 50 MG tablet; Take 1 tablet (50 mg total) by mouth at bedtime.  Dispense: 30 tablet; Refill: 3 - hydrOXYzine (ATARAX/VISTARIL) 25 MG tablet; Take 1 tablet (25 mg total) by mouth 3 (three) times daily as needed.  Dispense: 90 tablet; Refill: 3  Patient have been counseled extensively about nutrition and exercise.   Return in about 3 months (around  10/09/2016) for Follow up Pain and comorbidities.  The patient was given clear instructions to go to ER or return to medical center if symptoms don't improve, worsen or new problems develop. The patient verbalized understanding. The patient was told to call to get lab results if they haven't heard anything in the next week.   This note has been created with Surveyor, quantity. Any transcriptional errors are unintentional.    Angelica Chessman, MD, [place], [place], [place], [place] and [place], [place]   07/11/2016, 3:01 PM

## 2016-08-09 ENCOUNTER — Ambulatory Visit (HOSPITAL_COMMUNITY): Admission: RE | Admit: 2016-08-09 | Payer: Self-pay | Source: Ambulatory Visit

## 2016-08-23 ENCOUNTER — Encounter (HOSPITAL_COMMUNITY): Payer: Self-pay

## 2016-08-23 ENCOUNTER — Emergency Department (HOSPITAL_COMMUNITY)
Admission: EM | Admit: 2016-08-23 | Discharge: 2016-08-23 | Disposition: A | Discharge status: Home / Self Care | Payer: Self-pay | Attending: Physician Assistant | Admitting: Physician Assistant

## 2016-08-23 DIAGNOSIS — Y999 Unspecified external cause status: Secondary | ICD-10-CM | POA: Insufficient documentation

## 2016-08-23 DIAGNOSIS — S39012A Strain of muscle, fascia and tendon of lower back, initial encounter: Secondary | ICD-10-CM | POA: Insufficient documentation

## 2016-08-23 DIAGNOSIS — F17200 Nicotine dependence, unspecified, uncomplicated: Secondary | ICD-10-CM | POA: Insufficient documentation

## 2016-08-23 DIAGNOSIS — M62838 Other muscle spasm: Secondary | ICD-10-CM

## 2016-08-23 DIAGNOSIS — X58XXXA Exposure to other specified factors, initial encounter: Secondary | ICD-10-CM | POA: Insufficient documentation

## 2016-08-23 DIAGNOSIS — Y929 Unspecified place or not applicable: Secondary | ICD-10-CM | POA: Insufficient documentation

## 2016-08-23 DIAGNOSIS — Y939 Activity, unspecified: Secondary | ICD-10-CM | POA: Insufficient documentation

## 2016-08-23 MED ORDER — KETOROLAC TROMETHAMINE 60 MG/2ML IM SOLN
60.0000 mg | Freq: Once | INTRAMUSCULAR | Status: AC
  Administered 2016-08-23: 60 mg via INTRAMUSCULAR
  Filled 2016-08-23: qty 2

## 2016-08-23 MED ORDER — METHOCARBAMOL 500 MG PO TABS: 1000.0000 mg | ORAL_TABLET | Freq: Three times a day (TID) | ORAL | 0 refills | Status: DC | PRN

## 2016-08-23 NOTE — ED Provider Notes (Signed)
[place] DEPT Provider Note   CSN: [id number] Arrival date & time: 08/23/16  0859     History   Chief Complaint Chief Complaint  Patient presents with  . Back Pain    HPI Justin Randall is a 46 y.o. male.  HPI   47 year old male with history of recurrent chronic back pain for more than 10 years presenting today with complaints of low back pain. Patient states for the past 4-5 days he has had persistent right low back pain that radiates down his right leg. He described pain as a tightness muscle spasm sensation with occasional sharp pain worsening with movement of his hip but with walking. Pain is moderate in severity nothing seems to make it better or worse. There is no associated fever, chills, lightheadedness, dizziness, abdominal pain, dysuria, hematuria, bowel bladder incontinence, saddle anesthesia, or rash. He denies any specific strenuous activities or recent injuries to causes pain. He felt that muscle relaxant usually helps in the past. He denies any history of IV drug use or active cancer. He does have a primary care provider who recently is treating his chronic back pain with Elavil.  Past Medical History:  Diagnosis Date  . Sciatica   . Ulcer of the stomach and intestine     There are no active problems to display for this patient.   History reviewed. No pertinent surgical history.     Home Medications    Prior to Admission medications   Medication Sig Start Date End Date Taking? Authorizing Provider  amitriptyline (ELAVIL) 50 MG tablet Take 1 tablet (50 mg total) by mouth at bedtime. 07/11/16   Tresa Garter, MD  hydrOXYzine (ATARAX/VISTARIL) 25 MG tablet Take 1 tablet (25 mg total) by mouth 3 (three) times daily as needed. 07/11/16   Olugbemiga Essie Christine, MD  lidocaine (LIDODERM) 5 % Place 1 patch onto the skin daily. Remove & Discard patch within 12 hours or as directed by MD Patient not taking: Reported on 07/11/2016 04/18/16   Shawn C Joy, PA-C    methocarbamol (ROBAXIN) 500 MG tablet Take 2 tablets (1,000 mg total) by mouth 4 (four) times daily. X 10 days then prn spasm 06/24/16   Argentina Donovan, PA-C    Family History No family history on file.  Social History Social History  Substance Use Topics  . Smoking status: Current Every Day Smoker    Packs/day: 1.00  . Smokeless tobacco: Never Used  . Alcohol use No     Allergies   Patient has no known allergies.   Review of Systems Review of Systems  Constitutional: Negative for fever.  Gastrointestinal: Negative for abdominal pain.  Musculoskeletal: Positive for back pain.  Skin: Negative for rash and wound.  Neurological: Negative for numbness.     Physical Exam Updated Vital Signs BP 107/93 (BP Location: Left Arm)   Pulse 74   Temp 98.5 F (36.9 C) (Oral)   Resp 18   Ht 6' 2"  (1.88 m)   Wt 78.9 kg   SpO2 98%   BMI 22.34 kg/m   Physical Exam  Constitutional: He appears well-developed and well-nourished. No distress.  HENT:  Head: Atraumatic.  Eyes: Conjunctivae are normal.  Neck: Neck supple.  Cardiovascular: Intact distal pulses.   Abdominal: Soft. There is no tenderness.  Musculoskeletal: He exhibits tenderness (Tenderness to right paralumbar spinal region with increasing pain with straight leg raise.).  Neurological: He is alert.  Patellar deep tendon reflex intact bilaterally. Equal strength to bilateral lower  extremities. No foot drops.  Skin: No rash noted.  Psychiatric: He has a normal mood and affect.  Nursing note and vitals reviewed.    ED Treatments / Results  Labs (all labs ordered are listed, but only abnormal results are displayed) Labs Reviewed - No data to display  EKG  EKG Interpretation None       Radiology No results found.  Procedures Procedures (including critical care time)  Medications Ordered in ED Medications - No data to display   Initial Impression / Assessment and Plan / ED Course  I have reviewed  the triage vital signs and the nursing notes.  Pertinent labs & imaging results that were available during my care of the patient were reviewed by me and considered in my medical decision making (see chart for details).     BP 107/93 (BP Location: Left Arm)   Pulse 74   Temp 98.5 F (36.9 C) (Oral)   Resp 18   Ht 6' 2"  (1.88 m)   Wt 78.9 kg   SpO2 98%   BMI 22.34 kg/m    Final Clinical Impressions(s) / ED Diagnoses   Final diagnoses:  Strain of lumbar region, initial encounter    New Prescriptions Current Discharge Medication List     10:37 AM Patient here with acute on chronic low back pain with radicular pain. No red flags. Able to ambulate. No signs of infection. No urinary symptoms to suggest UTI or kidney stone. Patient given Toradol IM shot while he is in the ER. We'll discharge with muscle relaxant as it has helped him in the past. Encouraged patient to follow-up with his provider for further management. Return precaution discussed.   Domenic Moras, PA-C 08/23/16 [place], MD 08/23/16 1543

## 2016-08-23 NOTE — ED Triage Notes (Signed)
Pt. Recently diagnosed with sciatica and is having a flare -up.  Rt. Lower back down into his calf.

## 2016-08-26 ENCOUNTER — Encounter (HOSPITAL_COMMUNITY): Payer: Self-pay | Admitting: *Deleted

## 2016-08-26 ENCOUNTER — Emergency Department (HOSPITAL_COMMUNITY)
Admission: EM | Admit: 2016-08-26 | Discharge: 2016-08-26 | Disposition: A | Discharge status: Home / Self Care | Payer: Self-pay | Attending: Emergency Medicine | Admitting: Emergency Medicine

## 2016-08-26 DIAGNOSIS — F17210 Nicotine dependence, cigarettes, uncomplicated: Secondary | ICD-10-CM | POA: Insufficient documentation

## 2016-08-26 DIAGNOSIS — X500XXA Overexertion from strenuous movement or load, initial encounter: Secondary | ICD-10-CM | POA: Insufficient documentation

## 2016-08-26 DIAGNOSIS — Y929 Unspecified place or not applicable: Secondary | ICD-10-CM | POA: Insufficient documentation

## 2016-08-26 DIAGNOSIS — Y999 Unspecified external cause status: Secondary | ICD-10-CM | POA: Insufficient documentation

## 2016-08-26 DIAGNOSIS — Y939 Activity, unspecified: Secondary | ICD-10-CM | POA: Insufficient documentation

## 2016-08-26 DIAGNOSIS — Z79899 Other long term (current) drug therapy: Secondary | ICD-10-CM | POA: Insufficient documentation

## 2016-08-26 DIAGNOSIS — S39012A Strain of muscle, fascia and tendon of lower back, initial encounter: Secondary | ICD-10-CM | POA: Insufficient documentation

## 2016-08-26 MED ORDER — KETOROLAC TROMETHAMINE 30 MG/ML IJ SOLN
30.0000 mg | Freq: Once | INTRAMUSCULAR | Status: AC
  Administered 2016-08-26: 30 mg via INTRAMUSCULAR
  Filled 2016-08-26: qty 1

## 2016-08-26 MED ORDER — METHYLPREDNISOLONE 4 MG PO TBPK: ORAL_TABLET | ORAL | 0 refills | Status: DC

## 2016-08-26 MED ORDER — LIDOCAINE 5 % EX PTCH
1.0000 | MEDICATED_PATCH | Freq: Once | CUTANEOUS | Status: DC
  Administered 2016-08-26: 1 via TRANSDERMAL
  Filled 2016-08-26: qty 1

## 2016-08-26 MED ORDER — METHOCARBAMOL 500 MG PO TABS: 500.0000 mg | ORAL_TABLET | Freq: Two times a day (BID) | ORAL | 0 refills | Status: DC | PRN

## 2016-08-26 NOTE — Discharge Instructions (Signed)
Ice affected area for pain relief. Take prednisone as directed on package. Robaxin as needed for pain. Please continue taking your usual home medications. Follow up with your primary care provider for further discussion of your back pain.   You will need to follow up with your primary healthcare provider in 1-2 weeks for reassessment.  Be aware that if you develop new symptoms, such as a fever, leg weakness, difficulty with or loss of control of your urine or bowels, abdominal pain, or more severe pain, you will need to seek medical attention and  / or return to the Emergency department.

## 2016-08-26 NOTE — ED Provider Notes (Signed)
[facility] DEPT Provider Note   CSN: [id number] Arrival date & time: 08/26/16  0704     History   Chief Complaint Chief Complaint  Patient presents with  . Back Pain    HPI Justin Randall is a 46 y.o. male.  The history is provided by the patient and medical records. No language interpreter was used.  Back Pain   Pertinent negatives include no numbness, no abdominal pain, no dysuria and no weakness.    Justin Randall is a 46 y.o. male  with a PMH of sciatica who presents to the Emergency Department complaining of acute on chronic right lower back pain since yesterday. Patient states that he was lifting his young granddaughter last night and believes he pulled a muscle. He was seen in ER on 1/19 for same. He has taken ibuprofen with little relief. He notes that muscle relaxers typically help him with pain, but he is now out. Denies upper back or neck pain, Denies fever, saddle anesthesia, weakness, numbness, urinary complaints including retention/incontinence. No history of cancer, IVDU, or recent spinal procedures.   Past Medical History:  Diagnosis Date  . Sciatica   . Ulcer of the stomach and intestine     There are no active problems to display for this patient.   History reviewed. No pertinent surgical history.     Home Medications    Prior to Admission medications   Medication Sig Start Date End Date Taking? Authorizing Provider  amitriptyline (ELAVIL) 50 MG tablet Take 1 tablet (50 mg total) by mouth at bedtime. 07/11/16   Tresa Garter, MD  hydrOXYzine (ATARAX/VISTARIL) 25 MG tablet Take 1 tablet (25 mg total) by mouth 3 (three) times daily as needed. 07/11/16   Tresa Garter, MD  methocarbamol (ROBAXIN) 500 MG tablet Take 1 tablet (500 mg total) by mouth 2 (two) times daily as needed for muscle spasms. 08/26/16   Ozella Almond Bianca Raneri, PA-C  methylPREDNISolone (MEDROL DOSEPAK) 4 MG TBPK tablet Take as directed on package. 08/26/16   Pavillion, PA-C     Family History No family history on file.  Social History Social History  Substance Use Topics  . Smoking status: Current Every Day Smoker    Packs/day: 1.00  . Smokeless tobacco: Never Used  . Alcohol use No     Allergies   Patient has no known allergies.   Review of Systems Review of Systems  Gastrointestinal: Negative for abdominal pain, nausea and vomiting.  Genitourinary: Negative for dysuria, penile pain, penile swelling and scrotal swelling.  Musculoskeletal: Positive for back pain.  Neurological: Negative for weakness and numbness.     Physical Exam Updated Vital Signs BP 124/82 (BP Location: Left Arm)   Pulse 62   Temp 98.9 F (37.2 C) (Oral)   Resp 20   Ht _0  (1.88 m)   Wt 79.4 kg   SpO2 99%   BMI 22.47 kg/m   Physical Exam  Constitutional: He is oriented to person, place, and time. He appears well-developed and well-nourished.  Cardiovascular: Normal rate, regular rhythm, normal heart sounds and intact distal pulses.  Exam reveals no gallop and no friction rub.   No murmur heard. Pulmonary/Chest: Effort normal and breath sounds normal. No respiratory distress. He has no wheezes. He has no rales.  Abdominal: Soft. Bowel sounds are normal. He exhibits no distension. There is no tenderness.  Musculoskeletal:  Patient is able to ambulate with cane without difficulty. No noted deformities or signs of inflammation. No  overlying skin changes. No midline tenderness; tenderness to palpation of right lumbar paraspinal musculature Straight leg raises negative bilaterally for radicular symptoms, however this motion does exacerbate pain. 5/5 muscle strength of bilateral LE's   Neurological: He is alert and oriented to person, place, and time. He has normal reflexes.  Bilateral lower extremities neurovascularly intact.  Skin: Skin is warm and dry. No rash noted. No erythema.  Nursing note and vitals reviewed.    ED Treatments / Results  Labs (all labs  ordered are listed, but only abnormal results are displayed) Labs Reviewed - No data to display  EKG  EKG Interpretation None       Radiology No results found.  Procedures Procedures (including critical care time)  Medications Ordered in ED Medications  lidocaine (LIDODERM) 5 % 1 patch (not administered)  ketorolac (TORADOL) 30 MG/ML injection 30 mg (30 mg Intramuscular Given 08/26/16 6945)     Initial Impression / Assessment and Plan / ED Course  I have reviewed the triage vital signs and the nursing notes.  Pertinent labs & imaging results that were available during my care of the patient were reviewed by me and considered in my medical decision making (see chart for details).    Justin Randall is a 46 y.o. male who presents to ED for acute on chronic back pain. Patient demonstrates no lower extremity weakness, saddle anesthesia, bowel or bladder incontinence, or neuro deficits. No concern for cauda equina. No fevers or other infectious symptoms to suggest that the patient's back pain is due to an infection. Lower extremities are neurovascularly intact and patient is ambulatory in the ED. Patient has history of stomach ulcer, therefore do not feel like NSAIDs would be best choice for pain/inflammation. RX for steroid dose pack given. Patient encouraged to follow up with PCP for management of back pain. I have reviewed return precautions, including the development of any of these signs or symptoms, and the patient has voiced understanding. All questions answered.    Final Clinical Impressions(s) / ED Diagnoses   Final diagnoses:  Strain of lumbar region, initial encounter    New Prescriptions New Prescriptions   METHOCARBAMOL (ROBAXIN) 500 MG TABLET    Take 1 tablet (500 mg total) by mouth 2 (two) times daily as needed for muscle spasms.   METHYLPREDNISOLONE (MEDROL DOSEPAK) 4 MG TBPK TABLET    Take as directed on package.     [facility] Hiedi Touchton, PA-C 08/26/16 0388      Sherwood Gambler, MD 08/30/16 2224

## 2016-08-26 NOTE — ED Triage Notes (Signed)
Pt in c/o R lower back pain, pt seen last wk for similar symptoms, pt reports no relief with prescription medications given at that time, pt ambulatory to triage with a cane, pt reports lifting something last night & reports thinking he further injured his back, pt A&O x4

## 2016-08-30 ENCOUNTER — Other Ambulatory Visit: Payer: Self-pay | Admitting: Family Medicine

## 2016-08-30 ENCOUNTER — Emergency Department (HOSPITAL_COMMUNITY)
Admission: EM | Admit: 2016-08-30 | Discharge: 2016-08-30 | Disposition: A | Discharge status: Home / Self Care | Payer: Self-pay | Attending: Emergency Medicine | Admitting: Emergency Medicine

## 2016-08-30 ENCOUNTER — Encounter (HOSPITAL_COMMUNITY): Payer: Self-pay

## 2016-08-30 ENCOUNTER — Encounter (HOSPITAL_COMMUNITY): Payer: Self-pay | Admitting: *Deleted

## 2016-08-30 ENCOUNTER — Ambulatory Visit (HOSPITAL_COMMUNITY): Admission: RE | Admit: 2016-08-30 | Payer: Self-pay | Source: Ambulatory Visit

## 2016-08-30 ENCOUNTER — Ambulatory Visit (HOSPITAL_COMMUNITY)
Admission: RE | Admit: 2016-08-30 | Discharge: 2016-08-30 | Disposition: A | Discharge status: Home / Self Care | Payer: Self-pay | Source: Ambulatory Visit | Attending: Family Medicine | Admitting: Family Medicine

## 2016-08-30 DIAGNOSIS — M47892 Other spondylosis, cervical region: Secondary | ICD-10-CM | POA: Insufficient documentation

## 2016-08-30 DIAGNOSIS — M5441 Lumbago with sciatica, right side: Secondary | ICD-10-CM | POA: Insufficient documentation

## 2016-08-30 DIAGNOSIS — G8929 Other chronic pain: Secondary | ICD-10-CM | POA: Insufficient documentation

## 2016-08-30 DIAGNOSIS — Z87828 Personal history of other (healed) physical injury and trauma: Secondary | ICD-10-CM | POA: Insufficient documentation

## 2016-08-30 DIAGNOSIS — F17200 Nicotine dependence, unspecified, uncomplicated: Secondary | ICD-10-CM | POA: Insufficient documentation

## 2016-08-30 DIAGNOSIS — Z1810 Retained metal fragments, unspecified: Secondary | ICD-10-CM | POA: Insufficient documentation

## 2016-08-30 DIAGNOSIS — M5416 Radiculopathy, lumbar region: Secondary | ICD-10-CM | POA: Insufficient documentation

## 2016-08-30 MED ORDER — NAPROXEN 500 MG PO TABS: 500.0000 mg | ORAL_TABLET | Freq: Two times a day (BID) | ORAL | 0 refills | Status: DC

## 2016-08-30 MED ORDER — ACETAMINOPHEN 500 MG PO TABS
1000.0000 mg | ORAL_TABLET | Freq: Once | ORAL | Status: AC
  Administered 2016-08-30: 1000 mg via ORAL
  Filled 2016-08-30: qty 2

## 2016-08-30 MED ORDER — KETOROLAC TROMETHAMINE 30 MG/ML IJ SOLN
30.0000 mg | Freq: Once | INTRAMUSCULAR | Status: AC
  Administered 2016-08-30: 30 mg via INTRAMUSCULAR
  Filled 2016-08-30: qty 1

## 2016-08-30 MED ORDER — GABAPENTIN 100 MG PO CAPS
100.0000 mg | ORAL_CAPSULE | Freq: Once | ORAL | Status: AC
  Administered 2016-08-30: 100 mg via ORAL
  Filled 2016-08-30: qty 1

## 2016-08-30 MED ORDER — GABAPENTIN 100 MG PO CAPS: 100.0000 mg | ORAL_CAPSULE | Freq: Two times a day (BID) | ORAL | 0 refills | Status: DC

## 2016-08-30 NOTE — ED Triage Notes (Signed)
Pt reports being seen in past for same back pain. Had appt today at 3pm for outpatient MRI but came to ED due to pain and no relief with meds given.

## 2016-08-30 NOTE — ED Provider Notes (Signed)
[facility] DEPT Provider Note  CSN: [id number] Arrival date & time: 08/30/16  1330  History   Chief Complaint Chief Complaint  Patient presents with  . Back Pain   HPI Justin Randall is a 46 y.o. male.  The history is provided by the patient and medical records. No language interpreter was used.  Illness  This is a chronic problem. The current episode started more than 1 week ago. The problem occurs constantly. The problem has not changed since onset.Pertinent negatives include no chest pain, no abdominal pain, no headaches and no shortness of breath. Nothing aggravates the symptoms. Nothing relieves the symptoms.    Past Medical History:  Diagnosis Date  . Sciatica   . Ulcer of the stomach and intestine    There are no active problems to display for this patient.  History reviewed. No pertinent surgical history.   Home Medications    Prior to Admission medications   Medication Sig Start Date End Date Taking? Authorizing Provider  amitriptyline (ELAVIL) 50 MG tablet Take 1 tablet (50 mg total) by mouth at bedtime. 07/11/16   Tresa Garter, MD  gabapentin (NEURONTIN) 100 MG capsule Take 1 capsule (100 mg total) by mouth 2 (two) times daily. 08/30/16   Mayer Camel, MD  hydrOXYzine (ATARAX/VISTARIL) 25 MG tablet Take 1 tablet (25 mg total) by mouth 3 (three) times daily as needed. 07/11/16   Tresa Garter, MD  methocarbamol (ROBAXIN) 500 MG tablet Take 1 tablet (500 mg total) by mouth 2 (two) times daily as needed for muscle spasms. 08/26/16   Ozella Almond Ward, PA-C  methylPREDNISolone (MEDROL DOSEPAK) 4 MG TBPK tablet Take as directed on package. 08/26/16   Ozella Almond Ward, PA-C  naproxen (NAPROSYN) 500 MG tablet Take 1 tablet (500 mg total) by mouth 2 (two) times daily with a meal. 08/30/16   Mayer Camel, MD   Family History History reviewed. No pertinent family history.  Social History Social History  Substance Use Topics  . Smoking status: Current  Every Day Smoker    Packs/day: 1.00  . Smokeless tobacco: Never Used  . Alcohol use No    Allergies   Patient has no known allergies.   Review of Systems Review of Systems  Constitutional: Negative for activity change, appetite change, chills, diaphoresis, fatigue, fever and unexpected weight change.  Respiratory: Negative for shortness of breath.   Cardiovascular: Negative for chest pain.  Gastrointestinal: Negative for abdominal pain and blood in stool.  Genitourinary: Negative for hematuria.  Musculoskeletal: Positive for back pain. Negative for arthralgias, gait problem, joint swelling and neck pain.  Neurological: Positive for numbness (tingling). Negative for seizures, syncope, facial asymmetry, speech difficulty, weakness, light-headedness and headaches.  All other systems reviewed and are negative.   Physical Exam Updated Vital Signs BP 115/82 (BP Location: Left Arm)   Pulse 88   Temp 99.3 F (37.4 C) (Oral)   Resp 18   SpO2 98%   Physical Exam  Constitutional: He is oriented to person, place, and time. No distress.  Middle aged [place] male  HENT:  Head: Normocephalic and atraumatic.  Eyes: EOM are normal. Pupils are equal, round, and reactive to light.  Neck: Normal range of motion. Neck supple.  Cardiovascular: Normal rate, regular rhythm and normal heart sounds.   Pulmonary/Chest: Effort normal and breath sounds normal.  Abdominal: Soft. Bowel sounds are normal. He exhibits no distension. There is no tenderness.  Musculoskeletal: Normal range of motion. He exhibits tenderness (R buttock). He exhibits  no edema or deformity.  No midline C/T/L spine TTP, (+) straight leg raise test on the right, Pt able to ambulate with cane  Neurological: He is alert and oriented to person, place, and time.  CN II-XII intact, coordination normal, sensation grossly normal, DTR's 2+ throughout, able to ambulate but gait difficult to assess 2/2 use of cane  Skin: Skin is warm and dry.  Capillary refill takes less than 2 seconds. He is not diaphoretic.  Nursing note and vitals reviewed.   ED Treatments / Results  Labs (all labs ordered are listed, but only abnormal results are displayed) Labs Reviewed - No data to display  EKG  EKG Interpretation None      Radiology No results found.  Procedures Procedures (including critical care time)  Medications Ordered in ED Medications  gabapentin (NEURONTIN) capsule 100 mg (not administered)  ketorolac (TORADOL) 30 MG/ML injection 30 mg (not administered)  acetaminophen (TYLENOL) tablet 1,000 mg (not administered)    Initial Impression / Assessment and Plan / ED Course  I have reviewed the triage vital signs and the nursing notes.  46 y.o. male with above stated PMHx, HPI, and physical.  Pt has known history of low back pain over past several years which has been intermittent in nature. Negative for Hx of known CA, night sweats, sudden unexpected weight loss, fever - CA unlikely. Negative for chronic steroid use or osteoporosis or significant trauma - acute fracture unlikely. Negative for abdominal bruit or palpable pulsating mass, age <50 & non-smoker w/o Hx of HLD, vitals are WNL, pulses & BP equal bilaterally - AAA/dissection unlikely. Negative for persistent fever, no Hx if IVDU or previous back injections or DM/immunodeficieny - abscess or discitis unlikely. Negative for saddle anesthesia or bowel/bladder incontinence, rectal tone normal, neuro exam reveals no localized deficits - cauda equina unlikely.  Patient currently has chronic low back pain of unclear etiology but likely benign given above. Given exam - it is radicular in nature and most likely localized to his right sciatic nerve. Given Gabapentin, Toradol, & Tylenol. Patient advised to continue conservative treatment with OTC Tylenol, OTC Advil, and warm compresses. They may pursue gentle exercise/stretching regimen as tolerated. Pt's PCP ordered an MRI for  today. Called MRI and they will take patient for imaging this afternoon despite Pt missing appointment.  Pt discharged home in stable condition. Strict ED return precautions dicussed. Pt understands and agrees with the plan and has no further questions or concerns.   Pt care discussed with and followed by my attending, Dr. Dola Argyle, MD Pager [phone]  Final Clinical Impressions(s) / ED Diagnoses   Final diagnoses:  Lumbar radiculopathy  Chronic right-sided low back pain with right-sided sciatica   New Prescriptions New Prescriptions   GABAPENTIN (NEURONTIN) 100 MG CAPSULE    Take 1 capsule (100 mg total) by mouth 2 (two) times daily.   NAPROXEN (NAPROSYN) 500 MG TABLET    Take 1 tablet (500 mg total) by mouth 2 (two) times daily with a meal.     Mayer Camel, MD 08/30/16 [place], MD 09/07/16 2022
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
## 2016-08-30 NOTE — ED Notes (Signed)
See Nurse Note on Musculoskeletal Assessment.

## 2016-09-04 MED FILL — ?AMITRIPTYLINE HCL 50MG TAB: 50 | 30 days supply | Qty: 30 | Fill #1

## 2016-09-04 MED FILL — hydrOXYzine HCL 25 MG TABS: 25 | 30 days supply | Qty: 90 | Fill #1

## 2016-09-11 ENCOUNTER — Ambulatory Visit: Payer: Self-pay | Attending: Internal Medicine | Admitting: Internal Medicine

## 2016-09-11 VITALS — BP 103/66 | HR 76 | Temp 98.0°F | Wt 186.6 lb

## 2016-09-11 DIAGNOSIS — M5441 Lumbago with sciatica, right side: Secondary | ICD-10-CM | POA: Insufficient documentation

## 2016-09-11 DIAGNOSIS — M5440 Lumbago with sciatica, unspecified side: Secondary | ICD-10-CM

## 2016-09-11 DIAGNOSIS — G8929 Other chronic pain: Secondary | ICD-10-CM

## 2016-09-11 DIAGNOSIS — M62838 Other muscle spasm: Secondary | ICD-10-CM

## 2016-09-11 DIAGNOSIS — F4321 Adjustment disorder with depressed mood: Secondary | ICD-10-CM

## 2016-09-11 MED ORDER — AMITRIPTYLINE HCL 50 MG PO TABS: 50.0000 mg | ORAL_TABLET | Freq: Every day | ORAL | 3 refills | Status: DC

## 2016-09-11 MED ORDER — NAPROXEN 500 MG PO TABS: 500.0000 mg | ORAL_TABLET | Freq: Two times a day (BID) | ORAL | 3 refills | Status: DC

## 2016-09-11 MED ORDER — GABAPENTIN 100 MG PO CAPS: 100.0000 mg | ORAL_CAPSULE | Freq: Three times a day (TID) | ORAL | 3 refills | Status: DC

## 2016-09-11 MED ORDER — CYCLOBENZAPRINE HCL 10 MG PO TABS: 10.0000 mg | ORAL_TABLET | Freq: Three times a day (TID) | ORAL | 3 refills | Status: DC | PRN

## 2016-09-11 MED FILL — CYCLOBENZAPRINE 10 MG TAB: 10 | 20 days supply | Qty: 60 | Fill #0

## 2016-09-11 MED FILL — ?GABAPENTIN 100 MG CAP: 30 days supply | Qty: 90 | Fill #0

## 2016-09-11 MED FILL — NAPROXEN 500 MG TABLET: 500 | 30 days supply | Qty: 60 | Fill #0
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
## 2016-09-11 NOTE — Patient Instructions (Signed)
Adjustment Disorder Adjustment disorder is an unusually severe reaction to a stressful life event, such as the loss of a job or physical illness. The event may be any stressful event other than the loss of a loved one. Adjustment disorder may affect your feelings, your thinking, how you act, or a combination of these. It may interfere with personal relationships or with the way you are at work, school, or home. People with this disorder are at risk for suicide and substance abuse. They may develop a more serious mental disorder, such as major depressive disorder or post-traumatic stress disorder. SIGNS AND SYMPTOMS  Symptoms may include:  Sadness, depressed mood, or crying spells.  Loss of enjoyment.  Change in appetite or weight.  Sense of loss or hopelessness.  Thoughts of suicide.  Anxiety, worry, or nervousness.  Trouble sleeping.  Avoiding family and friends.  Poor school performance.  Fighting or vandalism.  Reckless driving.  Skipping school.  Poor work Systems analyst.  Ignoring bills. Symptoms of adjustment disorder start within 3 months of the stressful life event. They do not last more than 6 months after the event has ended. DIAGNOSIS  To make a diagnosis, your health care provider will ask about what has happened in your life and how it has affected you. He or she may also ask about your medical history and use of medicines, alcohol, and other substances. Your health care provider may do a physical exam and order lab tests or other studies. You may be referred to a mental health specialist for evaluation. TREATMENT  Treatment options include:  Counseling or talk therapy. Talk therapy is usually provided by mental health specialists.  Medicine. Certain medicines may help with depression, anxiety, and sleep.  Support groups. Support groups offer emotional support, advice, and guidance. They are made up of people who have had similar experiences. HOME CARE  INSTRUCTIONS  Keep all follow-up visits as directed by your health care provider. This is important.  Take medicines only as directed by your health care provider. SEEK MEDICAL CARE IF:  Your symptoms get worse.  SEEK IMMEDIATE MEDICAL CARE IF: You have serious thoughts about hurting yourself or someone else. MAKE SURE YOU:  Understand these instructions.  Will watch your condition.  Will get help right away if you are not doing well or get worse. This information is not intended to replace advice given to you by your health care provider. Make sure you discuss any questions you have with your health care provider. Document Released: 03/26/2006 Document Revised: 11/13/2015 Document Reviewed: 12/14/2013 Elsevier Interactive Patient Education  2017 [employer] Back Pain, Adult Back pain is very common in adults.The cause of back pain is rarely dangerous and the pain often gets better over time.The cause of your back pain may not be known. Some common causes of back pain include:  Strain of the muscles or ligaments supporting the spine.  Wear and tear (degeneration) of the spinal disks.  Arthritis.  Direct injury to the back. For many people, back pain may return. Since back pain is rarely dangerous, most people can learn to manage this condition on their own. Follow these instructions at home: Watch your back pain for any changes. The following actions may help to lessen any discomfort you are feeling:  Remain active. It is stressful on your back to sit or stand in one place for long periods of time. Do not sit, drive, or stand in one place for more than 30 minutes at a time. Take  short walks on even surfaces as soon as you are able.Try to increase the length of time you walk each day.  Exercise regularly as directed by your health care provider. Exercise helps your back heal faster. It also helps avoid future injury by keeping your muscles strong and flexible.  Do not stay  in bed.Resting more than 1-2 days can delay your recovery.  Pay attention to your body when you bend and lift. The most comfortable positions are those that put less stress on your recovering back. Always use proper lifting techniques, including:  Bending your knees.  Keeping the load close to your body.  Avoiding twisting.  Find a comfortable position to sleep. Use a firm mattress and lie on your side with your knees slightly bent. If you lie on your back, put a pillow under your knees.  Avoid feeling anxious or stressed.Stress increases muscle tension and can worsen back pain.It is important to recognize when you are anxious or stressed and learn ways to manage it, such as with exercise.  Take medicines only as directed by your health care provider. Over-the-counter medicines to reduce pain and inflammation are often the most helpful.Your health care provider may prescribe muscle relaxant drugs.These medicines help dull your pain so you can more quickly return to your normal activities and healthy exercise.  Apply ice to the injured area:  Put ice in a plastic bag.  Place a towel between your skin and the bag.  Leave the ice on for 20 minutes, 2-3 times a day for the first 2-3 days. After that, ice and heat may be alternated to reduce pain and spasms.  Maintain a healthy weight. Excess weight puts extra stress on your back and makes it difficult to maintain good posture. Contact a health care provider if:  You have pain that is not relieved with rest or medicine.  You have increasing pain going down into the legs or buttocks.  You have pain that does not improve in one week.  You have night pain.  You lose weight.  You have a fever or chills. Get help right away if:  You develop new bowel or bladder control problems.  You have unusual weakness or numbness in your arms or legs.  You develop nausea or vomiting.  You develop abdominal pain.  You feel faint. This  information is not intended to replace advice given to you by your health care provider. Make sure you discuss any questions you have with your health care provider. Document Released: 07/22/2005 Document Revised: 11/30/2015 Document Reviewed: 11/23/2013 Elsevier Interactive Patient Education  2017 [employer].

## 2016-09-11 NOTE — Progress Notes (Signed)
Patient is here for Sciatic Pain  Patient complains of lower back pain radiating down the right calf. Pain is scaled at a 10 currently.  Patient has taken medication today. Patient has eaten today.  Patient declined the flu vaccine today.

## 2016-10-16 MED FILL — CYCLOBENZAPRINE 10 MG TAB: 10 | 20 days supply | Qty: 60 | Fill #1

## 2016-10-16 MED FILL — NAPROXEN 500 MG TABLET: 500 | 30 days supply | Qty: 60 | Fill #1

## 2016-11-07 MED FILL — CYCLOBENZAPRINE 10 MG TAB: 10 | 20 days supply | Qty: 60 | Fill #2

## 2016-11-08 MED FILL — NAPROXEN 500 MG TABLET: 500 | 30 days supply | Qty: 60 | Fill #2

## 2016-12-05 MED FILL — NAPROXEN 500 MG TABLET: 500 | 30 days supply | Qty: 60 | Fill #3

## 2016-12-05 MED FILL — CYCLOBENZAPRINE 10 MG TAB: 10 | 20 days supply | Qty: 60 | Fill #3

## 2016-12-17 ENCOUNTER — Emergency Department (HOSPITAL_COMMUNITY): Payer: Self-pay

## 2016-12-17 ENCOUNTER — Emergency Department (HOSPITAL_COMMUNITY)
Admission: EM | Admit: 2016-12-17 | Discharge: 2016-12-17 | Disposition: A | Discharge status: Home / Self Care | Payer: Self-pay | Attending: Emergency Medicine | Admitting: Emergency Medicine

## 2016-12-17 ENCOUNTER — Encounter (HOSPITAL_COMMUNITY): Payer: Self-pay | Admitting: Emergency Medicine

## 2016-12-17 DIAGNOSIS — M25551 Pain in right hip: Secondary | ICD-10-CM | POA: Insufficient documentation

## 2016-12-17 DIAGNOSIS — M25559 Pain in unspecified hip: Secondary | ICD-10-CM

## 2016-12-17 DIAGNOSIS — F17200 Nicotine dependence, unspecified, uncomplicated: Secondary | ICD-10-CM | POA: Insufficient documentation

## 2016-12-17 DIAGNOSIS — Z79899 Other long term (current) drug therapy: Secondary | ICD-10-CM | POA: Insufficient documentation

## 2016-12-17 MED ORDER — METHYLPREDNISOLONE 4 MG PO TBPK: ORAL_TABLET | ORAL | 0 refills | Status: DC

## 2016-12-17 MED FILL — METHYLPREDNISOLONE 4 MG TAB: 4 | 6 days supply | Qty: 21 | Fill #0

## 2016-12-17 NOTE — ED Triage Notes (Signed)
Pt sts right sided sciatica pain that is chronic in nature

## 2016-12-17 NOTE — ED Notes (Signed)
Patient c/o lower back pain states he has had a problem for years was suppose to get MRI last year couldn't lay flat, so he never got it . Was referred to a pain clinic however lost the paper so he is back for referral.

## 2016-12-17 NOTE — Discharge Instructions (Signed)
Please read and follow all provided instructions.  Your diagnoses today include:  1. Right hip pain   2. Hip pain     Tests performed today include: Vital signs. See below for your results today.   Medications prescribed:  Take as prescribed   Home care instructions:  Follow any educational materials contained in this packet.  Follow-up instructions: Please follow-up with an Orthopedic provider for further evaluation of symptoms and treatment   Return instructions:  Please return to the Emergency Department if you do not get better, if you get worse, or new symptoms OR  - Fever (temperature greater than 101.30F)  - Bleeding that does not stop with holding pressure to the area    -Severe pain (please note that you may be more sore the day after your accident)  - Chest Pain  - Difficulty breathing  - Severe nausea or vomiting  - Inability to tolerate food and liquids  - Passing out  - Skin becoming red around your wounds  - Change in mental status (confusion or lethargy)  - New numbness or weakness    Please return if you have any other emergent concerns.  Additional Information:  Your vital signs today were: BP 125/86 (BP Location: Right Arm)    Pulse 84    Temp 98.1 F (36.7 C) (Oral)    Resp 18    SpO2 98%  If your blood pressure (BP) was elevated above 135/85 this visit, please have this repeated by your doctor within one month. ---------------
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
## 2017-01-07 ENCOUNTER — Other Ambulatory Visit: Payer: Self-pay | Admitting: Internal Medicine

## 2017-01-07 DIAGNOSIS — G8929 Other chronic pain: Secondary | ICD-10-CM

## 2017-01-07 DIAGNOSIS — M5440 Lumbago with sciatica, unspecified side: Secondary | ICD-10-CM

## 2017-01-07 DIAGNOSIS — M62838 Other muscle spasm: Secondary | ICD-10-CM

## 2017-01-08 MED FILL — NAPROXEN 500 MG TABLET: 500 | 30 days supply | Qty: 60 | Fill #0

## 2017-01-08 MED FILL — CYCLOBENZAPRINE 10 MG TAB: 10 | 20 days supply | Qty: 60 | Fill #0

## 2017-01-11 ENCOUNTER — Encounter (HOSPITAL_COMMUNITY): Payer: Self-pay | Admitting: *Deleted

## 2017-01-11 ENCOUNTER — Emergency Department (HOSPITAL_COMMUNITY)
Admission: EM | Admit: 2017-01-11 | Discharge: 2017-01-11 | Disposition: A | Discharge status: Home / Self Care | Payer: Self-pay | Attending: Emergency Medicine | Admitting: Emergency Medicine

## 2017-01-11 DIAGNOSIS — G8929 Other chronic pain: Secondary | ICD-10-CM | POA: Insufficient documentation

## 2017-01-11 DIAGNOSIS — F17200 Nicotine dependence, unspecified, uncomplicated: Secondary | ICD-10-CM | POA: Insufficient documentation

## 2017-01-11 DIAGNOSIS — Z79899 Other long term (current) drug therapy: Secondary | ICD-10-CM | POA: Insufficient documentation

## 2017-01-11 DIAGNOSIS — M5441 Lumbago with sciatica, right side: Secondary | ICD-10-CM | POA: Insufficient documentation

## 2017-01-11 NOTE — ED Triage Notes (Signed)
Pt reports hx of lower back pain/sciatica for over a year. Has pain that radiates into his right posterior leg. Has been referred to ortho but unable to get appt.

## 2017-01-11 NOTE — ED Provider Notes (Signed)
[name] Provider Note   CSN: [id number] Arrival date & time: 01/11/17  1146  By signing my name below, I, Justin Randall, attest that this documentation has been prepared under the direction and in the presence of [name], PA-C. Electronically Signed: Margit Randall, ED Scribe. 01/11/17. 1:41 PM.  History   Chief Complaint Chief Complaint  Patient presents with  . Back Pain   HPI Justin Randall is a 46 y.o. male with a PMHx of chronic back pain and sciatica who presents to the Emergency Department complaining of moderate chronic lower back pain/sciatica for the last year. Pt reports his pain radiates to his right posterior leg. He was referred to ortho but is unable to get an appointment because he can't afford it. He has tried medication with no relief. He doesn't want to go to physical therapy. Pt denies fever, syncope, trauma, unexplained weight loss, hx of cancer, loss of bowel/bladder function, saddle anesthesia, urinary retention, IVDU. He states the pain is no different than it has been and the main reason he is here is to see if he can get any resources to help him because he can't afford the care he needs. He states he does not want any medicines. He's been to his PCP who has not been able to help him and filed for disability and was denies. He states that if he can't get help he's going to have "sell drugs to make money" cause he doesn't know what else to do.   The history is provided by the patient. No language interpreter was used.   Past Medical History:  Diagnosis Date  . Sciatica   . Ulcer of the stomach and intestine     Patient Active Problem List   Diagnosis Date Noted  . Chronic right-sided low back pain with sciatica 09/11/2016  . Adjustment disorder with depressed mood 09/11/2016  . Muscle spasm 09/11/2016    History reviewed. No pertinent surgical history.   Home Medications    Prior to Admission medications   Medication Sig Start Date End  Date Taking? Authorizing Provider  amitriptyline (ELAVIL) 50 MG tablet Take 1 tablet (50 mg total) by mouth at bedtime. 09/11/16   Tresa Garter, MD  cyclobenzaprine (FLEXERIL) 10 MG tablet TAKE 1 TABLET BY MOUTH 3 TIMES DAILY AS NEEDED FOR MUSCLE SPASMS. 01/07/17   Tresa Garter, MD  gabapentin (NEURONTIN) 100 MG capsule Take 1 capsule (100 mg total) by mouth 3 (three) times daily. 09/11/16   Tresa Garter, MD  hydrOXYzine (ATARAX/VISTARIL) 25 MG tablet Take 1 tablet (25 mg total) by mouth 3 (three) times daily as needed. 07/11/16   Tresa Garter, MD  methylPREDNISolone (MEDROL DOSEPAK) 4 MG TBPK tablet Take as written 12/17/16   Shary Decamp, PA-C  naproxen (NAPROSYN) 500 MG tablet TAKE 1 TABLET BY MOUTH 2 TIMES DAILY WITH A MEAL. 01/07/17   Tresa Garter, MD    Family History History reviewed. No pertinent family history.  Social History Social History  Substance Use Topics  . Smoking status: Current Every Day Smoker    Packs/day: 1.00  . Smokeless tobacco: Never Used  . Alcohol use No    Allergies   Patient has no known allergies.   Review of Systems Review of Systems  Constitutional: Negative for fever.  Genitourinary: Negative for difficulty urinating.  Musculoskeletal: Positive for back pain and gait problem.  Neurological: Positive for numbness. Negative for weakness.    Physical Exam Updated Vital Signs BP  114/79 (BP Location: Right Arm)   Pulse 86   Temp 98.4 F (36.9 C) (Oral)   Resp 18   Ht _0  (1.88 m)   Wt 180 lb (81.6 kg)   SpO2 98%   BMI 23.11 kg/m   Physical Exam  Constitutional: He is oriented to person, place, and time. He appears well-developed and well-nourished. No distress.  HENT:  Head: Normocephalic and atraumatic.  Eyes: Conjunctivae and EOM are normal. Pupils are equal, round, and reactive to light. Right eye exhibits no discharge. Left eye exhibits no discharge. No scleral icterus.  Neck: Normal range of motion.    Cardiovascular: Normal rate.   Pulmonary/Chest: Effort normal. No respiratory distress.  Abdominal: He exhibits no distension.  Musculoskeletal: Normal range of motion.  Neurological: He is alert and oriented to person, place, and time.  Skin: Skin is warm and dry.  Psychiatric: His speech is normal and behavior is normal. His affect is angry (frustrated).  Nursing note and vitals reviewed.    ED Treatments / Results  DIAGNOSTIC STUDIES: Oxygen Saturation is 98% on RA, normal by my interpretation.   COORDINATION OF CARE: 1:41 PM-Discussed next steps with pt. Pt verbalized understanding and is agreeable with the plan.   Labs (all labs ordered are listed, but only abnormal results are displayed) Labs Reviewed - No data to display  EKG  EKG Interpretation None       Radiology No results found.  Procedures Procedures (including critical care time)  Medications Ordered in ED Medications - No data to display   Initial Impression / Assessment and Plan / ED Course  I have reviewed the triage vital signs and the nursing notes.  Pertinent labs & imaging results that were available during my care of the patient were reviewed by me and considered in my medical decision making (see chart for details).  46 year old male who is here for mostly social work issues for his chronic back pain. He is clearly frustrated and wants to discuss what options he has because he doesn't know where to turn. I spoke with CM on call Jeannie Crutchfield who recommends he follows up with his PCP and applies for Medicaid. I also gave him information for [facility] and different access points where he can talk about different insurance options. I think if he is able to get insurance he will be able to get the care he needs. He verbalized appreciation for these resources. Return precautions given.  Final Clinical Impressions(s) / ED Diagnoses   Final diagnoses:  Chronic right-sided low back pain with  right-sided sciatica    New Prescriptions New Prescriptions   No medications on file   I personally performed the services described in this documentation, which was scribed in my presence. The recorded information has been reviewed and is accurate.     Recardo Evangelist, PA-C 01/12/17 0945    Orlie Dakin, MD 01/12/17 [phone]

## 2017-01-11 NOTE — Discharge Instructions (Signed)
Please follow up with Dr. Doreene Burke as soon as possible

## 2017-01-11 NOTE — ED Notes (Signed)
Declined W/C at D/C and was escorted to lobby by RN. 

## 2017-01-11 NOTE — Care Management Note (Signed)
Case Management Note  Patient Details  Name: Alcus Bradly MRN: [id number] Date of Birth: [birth year]  Subjective/Objective:   CM received call from ED provider requesting assistance for this pt who has had  Multiple EDVs for back pain and is requesting MRI and possible surgical intervention. Pt is active with [facility] and is directed to return for PCP follow up and referral to specialist instead of EDV. No other CM needs at this time.                  Action/Plan: Directed PA to refer pt to [facility]. CM will sign off for now but will be available should additional discharge needs arise or disposition change.    Expected Discharge Date:                  Expected Discharge Plan:     In-House Referral:     Discharge planning Services  CM Consult  Post Acute Care Choice:    Choice offered to:     DME Arranged:    DME Agency:     HH Arranged:    [place] Agency:     Status of Service:  Completed, signed off  If discussed at [employer] of Stay Meetings, dates discussed:    Additional Comments:  Delrae Sawyers, RN 01/11/2017, 1:56 PM

## 2017-01-15 ENCOUNTER — Ambulatory Visit: Payer: Self-pay | Attending: Internal Medicine | Admitting: Internal Medicine

## 2017-01-15 ENCOUNTER — Encounter: Payer: Self-pay | Admitting: Internal Medicine

## 2017-01-15 DIAGNOSIS — F4321 Adjustment disorder with depressed mood: Secondary | ICD-10-CM | POA: Insufficient documentation

## 2017-01-15 DIAGNOSIS — M5440 Lumbago with sciatica, unspecified side: Secondary | ICD-10-CM | POA: Insufficient documentation

## 2017-01-15 DIAGNOSIS — M62838 Other muscle spasm: Secondary | ICD-10-CM | POA: Insufficient documentation

## 2017-01-15 DIAGNOSIS — Z8711 Personal history of peptic ulcer disease: Secondary | ICD-10-CM | POA: Insufficient documentation

## 2017-01-15 DIAGNOSIS — G8929 Other chronic pain: Secondary | ICD-10-CM

## 2017-01-15 DIAGNOSIS — Z87828 Personal history of other (healed) physical injury and trauma: Secondary | ICD-10-CM | POA: Insufficient documentation

## 2017-01-15 MED ORDER — AMITRIPTYLINE HCL 50 MG PO TABS: 50.0000 mg | ORAL_TABLET | Freq: Every day | ORAL | 3 refills | Status: DC

## 2017-01-15 MED ORDER — CYCLOBENZAPRINE HCL 10 MG PO TABS: ORAL_TABLET | ORAL | 3 refills | Status: DC

## 2017-01-15 MED ORDER — GABAPENTIN 300 MG PO CAPS: 300.0000 mg | ORAL_CAPSULE | Freq: Three times a day (TID) | ORAL | 3 refills | Status: DC

## 2017-01-15 MED ORDER — ACETAMINOPHEN-CODEINE #3 300-30 MG PO TABS: 1.0000 | ORAL_TABLET | ORAL | 0 refills | Status: DC | PRN

## 2017-01-15 MED FILL — GABAPENTIN 300 MG CAPSULE: 300 | 30 days supply | Qty: 90 | Fill #0

## 2017-01-15 MED FILL — AMITRIPTYLINE HCL 50 MG TAB: 50 | 30 days supply | Qty: 30 | Fill #0

## 2017-01-15 NOTE — Patient Instructions (Signed)
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
## 2017-01-15 NOTE — Progress Notes (Signed)
Patient is here for back pain  Patient complains of disc pain increasing and needing a referral. Pain is scaled at a 10 currently.  Patient has taken medication today. Patient has eaten today.

## 2017-01-16 MED FILL — ACETAMINOPHEN/COD #3 TABLET: 300-30 | 10 days supply | Qty: 60 | Fill #0

## 2017-01-28 ENCOUNTER — Ambulatory Visit: Payer: Self-pay

## 2017-05-09 ENCOUNTER — Other Ambulatory Visit: Payer: Self-pay | Admitting: Internal Medicine

## 2017-05-09 DIAGNOSIS — G8929 Other chronic pain: Secondary | ICD-10-CM

## 2017-05-09 DIAGNOSIS — M5440 Lumbago with sciatica, unspecified side: Principal | ICD-10-CM

## 2017-05-09 MED FILL — CYCLOBENZAPRINE 10 MG TAB: 10 | 20 days supply | Qty: 60 | Fill #0

## 2017-05-15 ENCOUNTER — Encounter: Payer: Self-pay | Admitting: Physician Assistant

## 2017-05-15 ENCOUNTER — Ambulatory Visit: Payer: Self-pay | Attending: Internal Medicine | Admitting: Physician Assistant

## 2017-05-15 VITALS — BP 103/72 | HR 79 | Temp 98.4°F | Resp 18 | Ht 74.0 in | Wt 192.8 lb

## 2017-05-15 DIAGNOSIS — M62838 Other muscle spasm: Secondary | ICD-10-CM

## 2017-05-15 DIAGNOSIS — Z791 Long term (current) use of non-steroidal anti-inflammatories (NSAID): Secondary | ICD-10-CM | POA: Insufficient documentation

## 2017-05-15 DIAGNOSIS — Z79899 Other long term (current) drug therapy: Secondary | ICD-10-CM | POA: Insufficient documentation

## 2017-05-15 DIAGNOSIS — Z79891 Long term (current) use of opiate analgesic: Secondary | ICD-10-CM | POA: Insufficient documentation

## 2017-05-15 DIAGNOSIS — M5441 Lumbago with sciatica, right side: Secondary | ICD-10-CM

## 2017-05-15 MED ORDER — CYCLOBENZAPRINE HCL 10 MG PO TABS: ORAL_TABLET | ORAL | 1 refills | Status: DC

## 2017-05-15 MED ORDER — MELOXICAM 15 MG PO TABS
15.0000 mg | ORAL_TABLET | Freq: Every day | ORAL | 3 refills | Status: DC
Start: 2017-05-15 — End: 2017-12-05

## 2017-05-15 MED FILL — ?MELOXICAM 15MG TABLET: 15 | 30 days supply | Qty: 30 | Fill #0

## 2017-05-15 NOTE — Progress Notes (Signed)
Patient ID: Justin Randall, male   DOB: 04/10/1971, 46 y.o.   MRN: [id number]   Justin Randall, is a 46 y.o. male  DDU:[id number]  [id number]:[id number]  DOB - [birth year]  Subjective:  Chief Complaint and HPI: Justin Randall is a 46 y.o. male here today for continued LBP.  He has had this pain for years. It worsened over the last year and he now has daily R leg pain and paresthesias associated with the pain.  He has not found a good medication regimen to alleviate the pain.  NKI. Has to walk with a cane.    He is filing for disability because he is unable to sit or stand for long periods of time.  He feels discouraged because of financial barriers of not being able to work which cause him to feel useless.  Having no income and not qualifying for disability is a burden.  His family is supportive.  He circles 1 on #9 of his PHQ-9 but denies SI/HI.  He says sometimes he wishes he "wasn't here." but no plan or intent to harm himself.  These are fleeting and rare thoughts.  PHQ-9 scores similar in the past.  Declines counseling or additional support.  Depression screen [facility] 2/9 05/15/2017 01/15/2017 09/11/2016 07/12/2016 06/24/2016  Decreased Interest 0 0 1 0 3  Down, Depressed, Hopeless 3 0 _0 PHQ - 2 Score 3 0 _1 Altered sleeping 2 0 _2 Tired, decreased energy _3 Change in appetite 1 0 _4 Feeling bad or failure about yourself  _5 Trouble concentrating _6 0  Moving slowly or fidgety/restless 0 0 1 0 0  Suicidal thoughts 1 0 2 1 0  PHQ-9 Score _7 ROS:   Constitutional:  No f/c, No night sweats, No unexplained weight loss. EENT:  No vision changes, No blurry vision, No hearing changes. No mouth, throat, or ear problems.  Respiratory: No cough, No SOB Cardiac: No CP, no palpitations GI:  No abd pain, No N/V/D. GU: No Urinary s/sx Musculoskeletal: + R sided LBP with R leg paresthesias and occasional weakness.   Neuro: No headache, no dizziness, no motor  weakness.  Skin: No rash Endocrine:  No polydipsia. No polyuria.  Psych: Denies SI/HI  No problems updated.  ALLERGIES: No Known Allergies  PAST MEDICAL HISTORY: Past Medical History:  Diagnosis Date  . Sciatica   . Ulcer of the stomach and intestine     MEDICATIONS AT HOME: Prior to Admission medications   Medication Sig Start Date End Date Taking? Authorizing Provider  acetaminophen-codeine (TYLENOL #3) 300-30 MG tablet TAKE 1 TABLET BY MOUTH EVERY 4 HOURS AS NEEDED. 05/12/17   Tresa Garter, MD  cyclobenzaprine (FLEXERIL) 10 MG tablet TAKE 1/2-1 TABLET BY MOUTH 3 TIMES DAILY AS NEEDED FOR MUSCLE SPASMS. 05/15/17   Argentina Donovan, PA-C  meloxicam (MOBIC) 15 MG tablet Take 1 tablet (15 mg total) by mouth daily. 05/15/17   Argentina Donovan, PA-C     Objective:  EXAM:   Vitals:   05/15/17 1150  BP: 103/72  Pulse: 79  Resp: 18  Temp: 98.4 F (36.9 C)  TempSrc: Oral  SpO2: 97%  Weight: 192 lb 12.8 oz (87.5 kg)  Height: _8  (1.88 m)    General appearance : A&OX3. NAD. Non-toxic-appearing HEENT: Atraumatic and Normocephalic.  PERRLA.  EOM intact.  Neck: supple, no JVD. No cervical lymphadenopathy. No thyromegaly Chest/Lungs:  Breathing-non-labored, Good air entry bilaterally, breath sounds normal without rales, rhonchi, or wheezing  CVS: S1 S2 regular, no murmurs, gallops, rubs  Back:  ROM ~80% of normal.  ++paraspinus spasm in RLB.  No vertebral tenderness.  Neg SLR B.  DTR 2=B.  No extremity atrophy.   Extremities: Bilateral Lower Ext shows no edema, both legs are warm to touch with = pulse throughout Neurology:  CN II-XII grossly intact, Non focal.   Psych:  TP linear. J/I WNL. Normal speech. Appropriate eye contact and affect.  Skin:  No Rash  Data Review No results found for: HGBA1C   Assessment & Plan   1. Acute on chronic right-sided low back pain with right-sided sciatica - MR Lumbar Spine Wo Contrast; Future - cyclobenzaprine (FLEXERIL) 10 MG  tablet; TAKE 1/2-1 TABLET BY MOUTH 3 TIMES DAILY AS NEEDED FOR MUSCLE SPASMS.  Dispense: 60 tablet; Refill: 1 - meloxicam (MOBIC) 15 MG tablet; Take 1 tablet (15 mg total) by mouth daily.  Dispense: 30 tablet; Refill: 3  2. Muscle spasm - cyclobenzaprine (FLEXERIL) 10 MG tablet; TAKE 1/2-1 TABLET BY MOUTH 3 TIMES DAILY AS NEEDED FOR MUSCLE SPASMS.  Dispense: 60 tablet; Refill: 1  Will refer once MRI is done to ortho vs neuro vs pain management  Patient have been counseled extensively about nutrition and exercise  Return in about 3 months (around 08/15/2017) for Dr Elease Hashimoto.  The patient was given clear instructions to go to ER or return to medical center if symptoms don't improve, worsen or new problems develop. The patient verbalized understanding. The patient was told to call to get lab results if they haven't heard anything in the next week.     Freeman Caldron, PA-C [facility] [place], [place]   05/15/2017, 12:58 PM

## 2017-05-27 ENCOUNTER — Ambulatory Visit (HOSPITAL_COMMUNITY)
Admission: RE | Admit: 2017-05-27 | Discharge: 2017-05-27 | Disposition: A | Discharge status: Home / Self Care | Payer: Self-pay | Source: Ambulatory Visit | Attending: Physician Assistant | Admitting: Physician Assistant

## 2017-05-27 DIAGNOSIS — M48061 Spinal stenosis, lumbar region without neurogenic claudication: Secondary | ICD-10-CM | POA: Insufficient documentation

## 2017-05-27 DIAGNOSIS — M5441 Lumbago with sciatica, right side: Secondary | ICD-10-CM | POA: Insufficient documentation

## 2017-05-27 DIAGNOSIS — M47816 Spondylosis without myelopathy or radiculopathy, lumbar region: Secondary | ICD-10-CM | POA: Insufficient documentation

## 2017-05-27 DIAGNOSIS — M4317 Spondylolisthesis, lumbosacral region: Secondary | ICD-10-CM | POA: Insufficient documentation

## 2017-05-27 DIAGNOSIS — D1809 Hemangioma of other sites: Secondary | ICD-10-CM | POA: Insufficient documentation

## 2017-05-27 DIAGNOSIS — M5127 Other intervertebral disc displacement, lumbosacral region: Secondary | ICD-10-CM | POA: Insufficient documentation

## 2017-05-28 ENCOUNTER — Other Ambulatory Visit: Payer: Self-pay | Admitting: Physician Assistant

## 2017-05-28 DIAGNOSIS — R9389 Abnormal findings on diagnostic imaging of other specified body structures: Secondary | ICD-10-CM

## 2017-05-30 ENCOUNTER — Telehealth: Payer: Self-pay | Admitting: Internal Medicine

## 2017-05-30 ENCOUNTER — Telehealth: Payer: Self-pay | Admitting: *Deleted

## 2017-05-30 NOTE — Telephone Encounter (Signed)
Medical Assistant left message on patient's home and cell voicemail. Voicemail states to give a call back to [place] with [facility] at [phone]. Patient is aware of nerve compression and disc bulging being noted and a need for a referral to neurosurgery for treatment and further evaluation.

## 2017-05-30 NOTE — Telephone Encounter (Signed)
Pt called back and reviewed results, pt understood and is aware that he has pending referral.

## 2017-05-30 NOTE — Telephone Encounter (Signed)
-----   Message from Argentina Donovan, [place] sent at 05/28/2017  7:39 AM EDT ----- Your MRI shows some nerve compression and disc bulging in your spine.  I am referring you to a neurosurgeon for assessment and treatment options. Thanks, Freeman Caldron, PA-C

## 2017-06-05 ENCOUNTER — Other Ambulatory Visit: Payer: Self-pay | Admitting: *Deleted

## 2017-06-05 DIAGNOSIS — M5440 Lumbago with sciatica, unspecified side: Principal | ICD-10-CM

## 2017-06-05 DIAGNOSIS — G8929 Other chronic pain: Secondary | ICD-10-CM

## 2017-06-05 MED ORDER — ACETAMINOPHEN-CODEINE #3 300-30 MG PO TABS: 1.0000 | ORAL_TABLET | ORAL | 0 refills | Status: DC | PRN

## 2017-06-05 NOTE — Telephone Encounter (Signed)
Last Filled 05/12/17.

## 2017-06-06 ENCOUNTER — Telehealth: Payer: Self-pay | Admitting: Internal Medicine

## 2017-06-06 MED FILL — ACETAMINOPHEN/COD #3 TABLET: 300-30 | 10 days supply | Qty: 60 | Fill #0

## 2017-06-06 NOTE — Telephone Encounter (Signed)
Patients MRI was completed on 05/27/17 and patient is aware of the results. Please clarify the actual need of the patient. Patient is also aware of being referred to a neurosurgeon which is still awaiting the initial appointment. Patients Tylenol 3 is at my desk for pick up.

## 2017-06-06 NOTE — Telephone Encounter (Signed)
Patient called to review his MRI status. Please follow up. Preferred phone number:[phone] Thanks

## 2017-06-06 NOTE — Telephone Encounter (Signed)
Patient called to inform that he has medication concerns(Tylenol #3),  Preferred phone [phone]

## 2017-06-09 ENCOUNTER — Other Ambulatory Visit: Payer: Self-pay | Admitting: Internal Medicine

## 2017-06-09 NOTE — Telephone Encounter (Signed)
This was prescribed on 06/05/2017

## 2017-07-20 ENCOUNTER — Encounter (HOSPITAL_COMMUNITY): Payer: Self-pay | Admitting: Emergency Medicine

## 2017-07-20 ENCOUNTER — Emergency Department (HOSPITAL_COMMUNITY)
Admission: EM | Admit: 2017-07-20 | Discharge: 2017-07-20 | Disposition: A | Discharge status: Home / Self Care | Payer: Self-pay | Attending: Emergency Medicine | Admitting: Emergency Medicine

## 2017-07-20 DIAGNOSIS — M5441 Lumbago with sciatica, right side: Secondary | ICD-10-CM | POA: Insufficient documentation

## 2017-07-20 DIAGNOSIS — Z79899 Other long term (current) drug therapy: Secondary | ICD-10-CM | POA: Insufficient documentation

## 2017-07-20 DIAGNOSIS — F17200 Nicotine dependence, unspecified, uncomplicated: Secondary | ICD-10-CM | POA: Insufficient documentation

## 2017-07-20 DIAGNOSIS — G8929 Other chronic pain: Secondary | ICD-10-CM | POA: Insufficient documentation

## 2017-07-20 DIAGNOSIS — M62838 Other muscle spasm: Secondary | ICD-10-CM

## 2017-07-20 MED ORDER — PREDNISONE 10 MG PO TABS: 10.0000 mg | ORAL_TABLET | Freq: Every day | ORAL | 0 refills | Status: DC

## 2017-07-20 MED ORDER — PREDNISONE 20 MG PO TABS
60.0000 mg | ORAL_TABLET | Freq: Once | ORAL | Status: AC
  Administered 2017-07-20: 60 mg via ORAL
  Filled 2017-07-20: qty 3

## 2017-07-20 MED ORDER — KETOROLAC TROMETHAMINE 30 MG/ML IJ SOLN
30.0000 mg | Freq: Once | INTRAMUSCULAR | Status: AC
  Administered 2017-07-20: 30 mg via INTRAMUSCULAR
  Filled 2017-07-20: qty 1

## 2017-07-20 MED ORDER — CYCLOBENZAPRINE HCL 10 MG PO TABS: ORAL_TABLET | ORAL | 1 refills | Status: DC

## 2017-07-20 MED ORDER — PREDNISONE 10 MG (21) PO TBPK: ORAL_TABLET | Freq: Every day | ORAL | 0 refills | Status: DC

## 2017-07-20 NOTE — ED Triage Notes (Addendum)
Pt states sciatic nerve pain to right hip radiaint down the right leg. Was given a consult and was sent a letter saying it was going to be 200$ to be seen and cannot afford to see them. Pt taking "medications for pain like tylenol but not on any nerve medication" Pt states he is unable to work with this pain and is waiting on disability.

## 2017-07-20 NOTE — Discharge Instructions (Addendum)
Please see the information and instructions below regarding your visit.  Your diagnoses today include:  1. Chronic right-sided low back pain with right-sided sciatica   2. Muscle spasm   3. Acute right-sided low back pain with right-sided sciatica    About diagnosis. Most episodes of acute low back pain are self-limited. Your exam was reassuring today that the source of your pain is not affecting the spinal cord and nerves that originate in the spinal cord.   If you have a history of disc herniation or arthritis in your spine, the nerves exiting the spine on one side get inflamed. This can cause severe pain. We call this radiculopathy. We do not always know what causes the sudden inflammation.  Tests performed today include: See side panel of your discharge paperwork for testing performed today. Vital signs are listed at the bottom of these instructions.   Medications prescribed:    Take any prescribed medications only as prescribed, and any over the counter medications only as directed on the packaging.  You are prescribed Flexeril, a muscle relaxant. Some common side effects of this medication include:  Feeling sleepy.  Dizziness. Take care upon going from a seated to a standing position.  Dry mouth.  Feeling tired or weak.  Hard stools (constipation).  Upset stomach. These are not all of the side effects that may occur. If you have questions about side effects, call your doctor. Call your primary care provider for medical advice about side effects.  This medication can be sedating. Only take this medication as needed. Please do not combine with alcohol. Do not drive or operate machinery while taking this medication.   This medication can interact with some other medications. Make sure to tell any provider you are taking this medication before they prescribe you a new medication.   You are prescribed prednisone, a steroid in the ED for @Diagnosis @. This is a medication to help reduce  inflammation in the spine and discs of the spine.  Common side effects include upset stomach/nausea. You may take this medicine with food if this occurs. Other side effects include restlessness, difficulty sleeping, and increased sweating. Call your healthcare provider if these do not resolve after finishing the medication.  This medicine may increase your blood sugar so additional careful monitoring is needed of blood sugar if you have diabetes. Call your healthcare provider for any signs/symtpoms of high blood sugar such as confusion, feeling sleepy, more thirst, more hunger, passing urine more often, flushing, fast breathing, or breath that smells like fruit.   Home care instructions:   Low back pain gets worse the longer you stay stationary. Please keep moving and walking as tolerated. There are exercises included in this packet to perform as tolerated for your low back pain.   Apply heat to the areas that are painful. Avoid twisting or bending your trunk to lift something. Do not lift anything above 25 lbs while recovering from this flare of low back pain.  Please follow any educational materials contained in this packet.   I also recommended salon PAS patches.  These are over-the-counter.  Follow-up instructions: Please follow-up with your primary care provider in as soon as possible for further evaluation of your symptoms if they are not completely improved.  Please also discussed physical therapy options.  I also provided some exercises in this packet.   Return instructions:  Please return to the Emergency Department if you experience worsening symptoms.  Please return for any fever or chills in  the setting of your back pain, weakness in the muscles of the legs, numbness in your legs and feet that is new or changing, numbness in the area where you wipe, retention of your urine, loss of bowel or bladder control, or problems with walking. Please return if you have any other emergent  concerns.  Additional Information:   Your vital signs today were: BP (!) 134/102 (BP Location: Right Arm)    Pulse 85    Temp 98.2 F (36.8 C) (Oral)    Resp 20    SpO2 100%  If your blood pressure (BP) was elevated on multiple readings during this visit above 130 for the top number or above 80 for the bottom number, please have this repeated by your primary care provider within one month. --------------  Thank you for allowing [place] to participate in your care today.

## 2017-07-20 NOTE — ED Provider Notes (Signed)
[name] EMERGENCY DEPARTMENT Provider Note   CSN: [id number] Arrival date & time: 07/20/17  4098     History   Chief Complaint Chief Complaint  Patient presents with  . Sciatica  . neuropathy    HPI Justin Randall is a 46 y.o. male.  HPI   Patient is a 46 year old male with a history of low back pain and MRI confirmed disc herniation at the L5-S1 level presenting for worsening lower back pain.  Patient reports that the pain is unchanged from his symptoms that he has had for 1.5 years.  Patient does report that it is worsening.  Patient has been treated multiple times in the emergency department with muscle relaxants, anti-inflammatories, and patches.  Patient has also been treated by his primary care provider.  Patient is reporting that he is unable to afford the co-pay for neurosurgery consultation.  Additionally, patient is unsure if he is eligible for physical therapy, but it has been discussed with his PCP.  Patient denies any recent trauma exacerbating his symptoms.  Patient has never had numbness or weakness with his symptoms.  No saddle anesthesia.  No loss of bowel or bladder control.  No IVDU or history of cancer.  No recent fever or chills.  Patient reports that he has never been on steroids for his symptoms.  Past Medical History:  Diagnosis Date  . Sciatica   . Ulcer of the stomach and intestine     Patient Active Problem List   Diagnosis Date Noted  . Chronic right-sided low back pain with sciatica 09/11/2016  . Adjustment disorder with depressed mood 09/11/2016  . Muscle spasm 09/11/2016    No past surgical history on file.     Home Medications    Prior to Admission medications   Medication Sig Start Date End Date Taking? Authorizing Provider  acetaminophen-codeine (TYLENOL #3) 300-30 MG tablet Take 1 tablet by mouth every 4 (four) hours as needed. 06/05/17   Tresa Garter, MD  cyclobenzaprine (FLEXERIL) 10 MG tablet TAKE 1/2-1  TABLET BY MOUTH 3 TIMES DAILY AS NEEDED FOR MUSCLE SPASMS. 07/20/17   Langston Masker B, PA-C  meloxicam (MOBIC) 15 MG tablet Take 1 tablet (15 mg total) by mouth daily. 05/15/17   Argentina Donovan, PA-C  predniSONE (DELTASONE) 10 MG tablet Take 1 tablet (10 mg total) by mouth daily. Take 6 tabs by mouth daily  for 1 days, then 5 tabs for 2 days, then 4 tabs for 2 days, then 3 tabs for 2 days, 2 tabs for 2 days, then 1 tab by mouth daily for 2 days 07/20/17   Albesa Seen, PA-C    Family History No family history on file.  Social History Social History   Tobacco Use  . Smoking status: Current Every Day Smoker    Packs/day: 1.00  . Smokeless tobacco: Never Used  Substance Use Topics  . Alcohol use: No  . Drug use: No     Allergies   Patient has no known allergies.   Review of Systems Review of Systems  Constitutional: Negative for chills and fever.  Gastrointestinal: Negative for abdominal pain.  Genitourinary: Negative for dysuria and flank pain.  Musculoskeletal: Positive for arthralgias and back pain. Negative for neck pain and neck stiffness.  Neurological: Negative for weakness and numbness.     Physical Exam Updated Vital Signs BP (!) 135/95 (BP Location: Right Arm)   Pulse 74   Temp 98 F (36.7 C) (Oral)  Resp 16   SpO2 100%   Physical Exam  Constitutional: He appears well-developed and well-nourished. No distress.  Sitting comfortably in bed.  HENT:  Head: Normocephalic and atraumatic.  Eyes: Conjunctivae are normal. Right eye exhibits no discharge. Left eye exhibits no discharge.  EOMs normal to gross examination.  Neck: Normal range of motion.  Cardiovascular: Normal rate, regular rhythm and intact distal pulses.  Intact, 2+ radial pulse.  Pulmonary/Chest:  Normal respiratory effort. Patient converses comfortably. No audible wheeze or stridor.  Abdominal: He exhibits no distension.  Musculoskeletal: Normal range of motion.  Spine  Exam: Inspection/Palpation: No tenderness to palpation of cervical, thoracic, or lumbar spine in the midline.  No paraspinal muscular tenderness of cervical, thoracic, or lumbar spine.  Straight leg raise reproduces symptoms radiating down the right leg. Strength: 5/5 throughout LE bilaterally (hip flexion/extension, adduction/abduction; knee flexion/extension; foot dorsiflexion/plantarflexion, inversion/eversion; great toe inversion) Sensation: Intact to light touch in proximal and distal LE bilaterally Reflexes: 2+ quadriceps and achilles reflexes Gait antalgic favoring left leg. Patient ambulates with cane. No evidence of footdrop.  Neurological: He is alert.  Cranial nerves intact to gross observation. Patient moves extremities without difficulty.  Skin: Skin is warm and dry. He is not diaphoretic.  Psychiatric: He has a normal mood and affect. His behavior is normal. Judgment and thought content normal.  Nursing note and vitals reviewed.    ED Treatments / Results  Labs (all labs ordered are listed, but only abnormal results are displayed) Labs Reviewed - No data to display  EKG  EKG Interpretation None       Radiology No results found.  Procedures Procedures (including critical care time)  Medications Ordered in ED Medications  ketorolac (TORADOL) 30 MG/ML injection 30 mg (30 mg Intramuscular Given 07/20/17 1105)  predniSONE (DELTASONE) tablet 60 mg (60 mg Oral Given 07/20/17 1105)     Initial Impression / Assessment and Plan / ED Course  I have reviewed the triage vital signs and the nursing notes.  Pertinent labs & imaging results that were available during my care of the patient were reviewed by me and considered in my medical decision making (see chart for details).     Final Clinical Impressions(s) / ED Diagnoses   Final diagnoses:  Chronic right-sided low back pain with right-sided sciatica   Patient denies any concerning symptoms suggestive of cauda  equina requiring urgent imaging at this time such as loss of sensation in the lower extremities, lower extremity weakness, loss of bowel or bladder control, saddle anesthesia, urinary retention, fever/chills, IVDU. Exam demonstrated no  weakness on exam today and is unchanged from prior presentations of acute on chronic back pain. No new flank or abdominal pain to suggest intraabdominal pathologies. No preceding injury or trauma to suggest acute fracture. MRI from 05/2017 reviewed and demonstrates impingement of right S1 nerve root. Likely contributory to patient's symptoms, but patient intact in S1 distribution. Patient given strict return precautions for any symptoms indicating worsening neurologic function in the lower extremities. Rx for tapering steroid course and refilled MSK relaxant.   I gave patient financial resources.  I also discussed the utility of steroids and provided a coupon.  I discussed with patient that long-term, medications are not the most effective therapy for her low back pain and that he will need to continue to be persistent with referrals, and using the financial resources presented to attempt to get into specialist consultation and/or physical therapy. Packet of home PT exercises provided for patient  Tamala Julian 07/21/17 1227    Carmin Muskrat, MD 07/25/17 2118

## 2017-07-20 NOTE — ED Notes (Signed)
Declined W/C at D/C and was escorted to lobby by RN. 

## 2017-07-21 ENCOUNTER — Encounter: Payer: Self-pay | Admitting: Internal Medicine

## 2017-07-24 MED FILL — CYCLOBENZAPRINE 10 MG TAB: 10 | 20 days supply | Qty: 60 | Fill #0

## 2017-07-24 MED FILL — ?PREDNISONE 10 MG TABLET: 10 | 11 days supply | Qty: 36 | Fill #0

## 2017-08-03 ENCOUNTER — Emergency Department (HOSPITAL_COMMUNITY)
Admission: EM | Admit: 2017-08-03 | Discharge: 2017-08-03 | Disposition: A | Discharge status: Home / Self Care | Payer: Self-pay | Attending: Emergency Medicine | Admitting: Emergency Medicine

## 2017-08-03 ENCOUNTER — Other Ambulatory Visit: Payer: Self-pay

## 2017-08-03 ENCOUNTER — Encounter (HOSPITAL_COMMUNITY): Payer: Self-pay

## 2017-08-03 DIAGNOSIS — M5441 Lumbago with sciatica, right side: Secondary | ICD-10-CM | POA: Insufficient documentation

## 2017-08-03 DIAGNOSIS — G8929 Other chronic pain: Secondary | ICD-10-CM | POA: Insufficient documentation

## 2017-08-03 DIAGNOSIS — Z79899 Other long term (current) drug therapy: Secondary | ICD-10-CM | POA: Insufficient documentation

## 2017-08-03 DIAGNOSIS — F17210 Nicotine dependence, cigarettes, uncomplicated: Secondary | ICD-10-CM | POA: Insufficient documentation

## 2017-08-03 MED ORDER — METHOCARBAMOL 500 MG PO TABS: 500.0000 mg | ORAL_TABLET | Freq: Four times a day (QID) | ORAL | 0 refills | Status: DC | PRN

## 2017-08-03 MED ORDER — LIDOCAINE 5 % EX PTCH: 1.0000 | MEDICATED_PATCH | CUTANEOUS | 0 refills | Status: DC

## 2017-08-03 NOTE — ED Triage Notes (Signed)
Patient complains of ongoing lower back pain with radiation down right leg. States that he has had the same in past and was sciatica

## 2017-08-03 NOTE — ED Notes (Signed)
Pt reports on-going lower back pain that radiates to his leg.  Reports hx of sciatica.  Denies any tingling or numbness.  He ambulates with a cane without difficulty.  CMS intact.

## 2017-08-03 NOTE — ED Provider Notes (Signed)
[facility]ERG[facility] DEPARTMENT Provider Note   CSN: [id number] Arrival date & time: 08/03/17  1025     History   Chief Complaint Chief Complaint  Patient presents with  . Back Pain    HPI Justin Randall is a 46 y.o. 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  Patient states that his symptoms character/location are fairly unchanged over the past 2 years, feels pain is worsening and just not getting better.  States he is having pain in his right lower back radiating down the right lower extremity, states pain feels somewhat like tingling in RLE, but never numb. Pain is worse with movement.  He has been prescribed multiple different types of medications without relief of his pain.  Has had muscle relaxers, steroids (PO and IM), and nonsteroidal anti-inflammatories all without relief. Most recently completed a steroid taper today. Patient is reporting that he is unable to afford the co-pay for neurosurgery consultation.  No recent injury or change in activity.  No saddle anesthesia.  Denies numbness, weakness, incontinence to bowel/bladder, fever, chills, IV drug use, or hx of cancer. Patient walks with a cane at baseline.   HPI  Past Medical History:  Diagnosis Date  . Sciatica   . Ulcer of the stomach and intestine     Patient Active Problem List   Diagnosis Date Noted  . Chronic right-sided low back pain with sciatica 09/11/2016  . Adjustment disorder with depressed mood 09/11/2016  . Muscle spasm 09/11/2016    History reviewed. No pertinent surgical history.     Home Medications    Prior to Admission medications   Medication Sig Start Date End Date Taking? Authorizing Provider  acetaminophen-codeine (TYLENOL #3) 300-30 MG tablet Take 1 tablet by mouth every 4 (four) hours as needed. 06/05/17   Tresa Garter, MD    cyclobenzaprine (FLEXERIL) 10 MG tablet TAKE 1/2-1 TABLET BY MOUTH 3 TIMES DAILY AS NEEDED FOR MUSCLE SPASMS. 07/20/17   Langston Masker B, PA-C  meloxicam (MOBIC) 15 MG tablet Take 1 tablet (15 mg total) by mouth daily. 05/15/17   Argentina Donovan, PA-C  predniSONE (DELTASONE) 10 MG tablet Take 1 tablet (10 mg total) by mouth daily. Take 6 tabs by mouth daily  for 1 days, then 5 tabs for 2 days, then 4 tabs for 2 days, then 3 tabs for 2 days, 2 tabs for 2 days, then 1 tab by mouth daily for 2 days 07/20/17   Albesa Seen, PA-C    Family History History reviewed. No pertinent family history.  Social History Social History   Tobacco Use  . Smoking status: Current Every Day Smoker    Packs/day: 1.00  . Smokeless tobacco: Never Used  Substance Use Topics  . Alcohol use: No  . Drug use: No     Allergies   Patient has no known allergies.   Review of Systems Review of Systems  Constitutional: Negative for chills and fever.  Gastrointestinal: Negative for nausea and vomiting.  Musculoskeletal: Positive for back pain (radiating down RLE). Negative for neck pain.  Neurological: Negative for weakness and numbness.       Negative for incontinence. Negative for saddle anesthesia.     Physical Exam Updated Vital Signs BP (!) 126/92   Pulse 91   Temp 98.3 F (36.8 C) (Oral)   Resp 18  SpO2 97%   Physical Exam  Constitutional: He appears well-developed and well-nourished. No distress.  HENT:  Head: Normocephalic and atraumatic.  Eyes: Conjunctivae are normal. Right eye exhibits no discharge. Left eye exhibits no discharge.  Cardiovascular:  Pulses:      Posterior tibial pulses are 2+ on the right side, and 2+ on the left side.  Musculoskeletal:  Back: No vertebral tenderness. Patient with R lumbar paraspinal muscle tenderness to palpation with palpable muscle spasm. No overlying erythema, ecchymosis, fluctuance, or warmth. No obvious deformity or appreciable swelling.    Neurological: He is alert.  Bilateral upper and lower extremities' sensation grossly intact. 5/5 grip strength bilaterally. 5/5 plantar and dorsi flexion bilaterally. Patient is able to ambulate, he uses a cane at baseline. Straight leg raise reproduces symptoms radiating down the right leg with RLE straight leg raise.   Psychiatric: He has a normal mood and affect. His behavior is normal. Thought content normal.  Nursing note and vitals reviewed.   ED Treatments / Results  Labs (all labs ordered are listed, but only abnormal results are displayed) Labs Reviewed - No data to display  EKG  EKG Interpretation None      Radiology No results found.  Procedures Procedures (including critical care time)  Medications Ordered in ED Medications - No data to display   Initial Impression / Assessment and Plan / ED Course  I have reviewed the triage vital signs and the nursing notes.  Pertinent labs & imaging results that were available during my care of the patient were reviewed by me and considered in my medical decision making (see chart for details).  Patient presents with worsening chronic back pain radiating down RLE.  He is nontoxic-appearing with stable vital signs. Patient is without midline tenderness, no neurologic deficits on exam, he is able to ambulate, but states it is painful. Patient denies any concerning symptoms that are suggestive of cauda equina such as LE weakness, loss of LE sensation, loss of bowel or bladder control, saddle anesthesia, urinary retention, fever/chills, IVDU. No preceding trauma/injury. Reviewed MRI from October 2018- there is impingement of right S1 nerve root, suspect patient's symptoms to be related to this, patient with intact strength and sensation.  Given patient has tried a variety of therapeutic modalities without success will provide a prescription for Robaxin as this is a muscle relaxer he has not tired and Lidoderm patches, will also provide  contact information for pain management given patient's chronic pain and difficulty with affording neurosurgery consultation. Discussed need for PCP and pain management follow up. I discussed treatment plan, need for follow-up, and strict return precautions with the patient. Provided opportunity for questions, patient confirmed understanding and is in agreement with plan.   Final Clinical Impressions(s) / ED Diagnoses   Final diagnoses:  Chronic right-sided low back pain with right-sided sciatica    ED Discharge Orders        Ordered    methocarbamol (ROBAXIN) 500 MG tablet  Every 6 hours PRN     08/03/17 1045    lidocaine (LIDODERM) 5 %  Every 24 hours     08/03/17 [address], [place] R, PA-C 08/03/17 1733    Pattricia Boss, MD 08/04/17 1724

## 2017-08-03 NOTE — Discharge Instructions (Signed)
You were seen in the emergency department for your right lower back pain that goes down your right leg.  This seems consistent with your pain that you have been having for the last 2 years.  You have tried multiple medications without relief, I have prescribed you Robaxin which is a muscle relaxer as well as Lidoderm patches which are patches that you can apply directly to your areas of pain.  If at the pharmacy the Lidoderm patches are too expensive, ask the pharmacist with the over-the-counter patches are, these are very similar.  I have provided you with information for a pain management clinic in the area.  Call them tomorrow in order to schedule an appointment for management of your pain until able to afford a consultation with a neurosurgeon. You may also follow up with your primary care provider for a referral to pain management if necessary. However if you develop severe or worsening pain, low back pain with fever, numbness, weakness, loss of bowel or bladder control, or inability to walk or urinate, you should return to the ER immediately.  Please follow up with your doctor this week for a recheck if still having symptoms.

## 2017-08-07 ENCOUNTER — Encounter (HOSPITAL_COMMUNITY): Payer: Self-pay | Admitting: Emergency Medicine

## 2017-08-07 ENCOUNTER — Other Ambulatory Visit: Payer: Self-pay

## 2017-08-07 ENCOUNTER — Emergency Department (HOSPITAL_COMMUNITY)
Admission: EM | Admit: 2017-08-07 | Discharge: 2017-08-07 | Disposition: A | Discharge status: Home / Self Care | Payer: Self-pay | Attending: Emergency Medicine | Admitting: Emergency Medicine

## 2017-08-07 DIAGNOSIS — M5441 Lumbago with sciatica, right side: Secondary | ICD-10-CM | POA: Insufficient documentation

## 2017-08-07 DIAGNOSIS — Z79899 Other long term (current) drug therapy: Secondary | ICD-10-CM | POA: Insufficient documentation

## 2017-08-07 DIAGNOSIS — F17210 Nicotine dependence, cigarettes, uncomplicated: Secondary | ICD-10-CM | POA: Insufficient documentation

## 2017-08-07 DIAGNOSIS — G8929 Other chronic pain: Secondary | ICD-10-CM | POA: Insufficient documentation

## 2017-08-07 MED ORDER — TRAMADOL HCL 50 MG PO TABS: 50.0000 mg | ORAL_TABLET | Freq: Four times a day (QID) | ORAL | 0 refills | Status: DC | PRN

## 2017-08-07 MED FILL — traMADol HCL 50 MG TABS: 50 | 2 days supply | Qty: 8 | Fill #0

## 2017-08-07 NOTE — Discharge Instructions (Signed)
Workup has been normal. Please take medications as prescribed and instructed.  Is very important that you follow-up with your primary care doctor and neurosurgeon.  Perform the back stretches that were given to you earlier.  Have given you a short course of pain medication called tramadol.  This medication will make you drowsy so do not drive with it.  SEEK IMMEDIATE MEDICAL ATTENTION IF: New numbness, tingling, weakness, or problem with the use of your arms or legs.  Severe back pain not relieved with medications.  Change in bowel or bladder control.  Urinary retention.  Numbness in your groin.  Increasing pain in any areas of the body (such as chest or abdominal pain).  Shortness of breath, dizziness or fainting.  Nausea (feeling sick to your stomach), vomiting, fever, or sweats.

## 2017-08-07 NOTE — ED Provider Notes (Signed)
[facility] EMERGENCY DEPARTMENT Provider Note   CSN: [id number] Arrival date & time: 08/07/17  0825     History   Chief Complaint Chief Complaint  Patient presents with  . Hip Pain    HPI Justin Randall is a 47 y.o. male.  HPI 47 year old African-American male past medical history significant for chronic back pain and sciatic nerve pain that presents to the ED for continued acute on chronic low back pain.  Patient states that his pain has been worse over the past few months.  Patient has been seen several times in the ED for same pain.  Specifically patient has been seen 3 times in the past 6 months for back pain.  Patient states that his symptoms are fairly unchanged over the past 2 years.  However he does feel the pain is worsening and is becoming constant and not getting any better.  The patient reports pain in his right lower back that radiates down his right leg.  He reports intermittent paresthesias.  Denies any associated weakness.  Patient reports pain with ambulation, bending, palpation.  Patient has tried multiple different types of medications including steroid tapers, lidocaine patches, anti-inflammatories, muscle relaxer without any relief.  Patient states that he was going to follow-up with a neurosurgeon however they were aware asking for $200 co-pay that he cannot afford.  The patient reports that he is currently trying to receive disability for this because he is able to work due to the pain.  He denies any recent injury or change in activity. Pt denies any ha, night sweats, hx of ivdu/cancer, loss or bowel or bladder, urinary retention, saddle paresthesias, lower extremity paresthesias.  Denies any associated urinary symptoms.  Patient was seen on 12/30 in the ED for same.  At that time he was given a different type of steroid and muscle relaxer that he states he did not get filled because "those pills are like candy and do not do anything for me".  Patient  states that he attempted to call his PCP today but was able to see them.  Of note patient did have an MRI in the ED in October 2018 that I reviewed myself and demonstrates impingement of right S1 nerve root along with some left nerve root impingement however patient has no pain on the left side.   Past Medical History:  Diagnosis Date  . Sciatica   . Ulcer of the stomach and intestine     Patient Active Problem List   Diagnosis Date Noted  . Chronic right-sided low back pain with sciatica 09/11/2016  . Adjustment disorder with depressed mood 09/11/2016  . Muscle spasm 09/11/2016    History reviewed. No pertinent surgical history.     Home Medications    Prior to Admission medications   Medication Sig Start Date End Date Taking? Authorizing Provider  acetaminophen-codeine (TYLENOL #3) 300-30 MG tablet Take 1 tablet by mouth every 4 (four) hours as needed. 06/05/17   Tresa Garter, MD  cyclobenzaprine (FLEXERIL) 10 MG tablet TAKE 1/2-1 TABLET BY MOUTH 3 TIMES DAILY AS NEEDED FOR MUSCLE SPASMS. 07/20/17   Langston Masker B, PA-C  lidocaine (LIDODERM) 5 % Place 1 patch onto the skin daily. Remove & Discard patch within 12 hours or as directed by MD 08/03/17   Petrucelli, Glynda Jaeger, PA-C  meloxicam (MOBIC) 15 MG tablet Take 1 tablet (15 mg total) by mouth daily. 05/15/17   Argentina Donovan, PA-C  methocarbamol (ROBAXIN) 500 MG tablet Take  1 tablet (500 mg total) by mouth every 6 (six) hours as needed for muscle spasms. 08/03/17   Petrucelli, Samantha R, PA-C  predniSONE (DELTASONE) 10 MG tablet Take 1 tablet (10 mg total) by mouth daily. Take 6 tabs by mouth daily  for 1 days, then 5 tabs for 2 days, then 4 tabs for 2 days, then 3 tabs for 2 days, 2 tabs for 2 days, then 1 tab by mouth daily for 2 days 07/20/17   Albesa Seen, PA-C    Family History No family history on file.  Social History Social History   Tobacco Use  . Smoking status: Current Every Day Smoker     Packs/day: 1.00  . Smokeless tobacco: Never Used  Substance Use Topics  . Alcohol use: No  . Drug use: No     Allergies   Patient has no known allergies.   Review of Systems Review of Systems Constitutional: Negative for chills and fever.  Gastrointestinal: Negative for nausea and vomiting.  Musculoskeletal: Positive for back pain (radiating down RLE). Negative for neck pain.  Neurological: Negative for weakness and numbness.       Negative for incontinence. Negative for saddle anesthesia.       Physical Exam Updated Vital Signs BP (!) 135/96 (BP Location: Right Arm)   Pulse 92   Temp 98.4 F (36.9 C) (Oral)   Resp 18   SpO2 99%   Physical Exam  Constitutional: He is oriented to person, place, and time. He appears well-developed and well-nourished. No distress.  HENT:  Head: Normocephalic and atraumatic.  Eyes: Right eye exhibits no discharge. Left eye exhibits no discharge. No scleral icterus.  Neck: Normal range of motion.  Cardiovascular: Intact distal pulses.  Pulmonary/Chest: No respiratory distress.  Musculoskeletal: Normal range of motion.  No midline T spine or L spine tenderness. No deformities or step offs noted. Full ROM. Pelvis is stable.  The patient does have right lumbar paraspinal tenderness that radiates down his right buttocks into his right leg.  Positive straight leg raise test on the right that reproduces his pain.  DP pulses are 2+ bilaterally. Sensation intact.  Neurological: He is alert and oriented to person, place, and time.  5/5 plantar and dorsi flexion bilaterally.  5 out of 5 patellar reflexes.  Patient is able to ambulate, he uses a cane at baseline. Straight leg raise reproduces symptoms radiating down the right leg with RLE straight leg raise.    Sensation is intact in all dermatomes especially in the S1 nerve root.  She has no appreciable weakness of the right lower leg.  Ambulates normally.  Skin: Skin is warm and dry. No pallor.    Psychiatric: His behavior is normal. Judgment and thought content normal.  Nursing note and vitals reviewed.    ED Treatments / Results  Labs (all labs ordered are listed, but only abnormal results are displayed) Labs Reviewed - No data to display  EKG  EKG Interpretation None       Radiology No results found.  Procedures Procedures (including critical care time)  Medications Ordered in ED Medications - No data to display   Initial Impression / Assessment and Plan / ED Course  I have reviewed the triage vital signs and the nursing notes.  Pertinent labs & imaging results that were available during my care of the patient were reviewed by me and considered in my medical decision making (see chart for details).     Patient presents to  the ED with complaint of ongoing acute on chronic low back pain with sciatic nerve pain.  Patient is overall well-appearing and nontoxic.  Vital signs are very reassuring.  Patient is afebrile in the ED.  He has no red flag symptoms that is concerning for cauda equina.  He is neurovascularly intact without any focal neuro deficits on exam.  Patient symptoms seem consistent with acute on chronic low back pain with sciatic nerve pain.  I did review patient's MRI from October 2018 that shows impingement of the right S1 nerve root.  However patient is neurovascularly intact and has sensation in this dermatome.  There is no appreciable weakness in the lower extremities.  However given this nerve impingement likely causing patient's symptoms.  Patient has tried several therapeutic medications without any relief.  Had a long discussion with patient concerning pain medications.  Discussed with patient that pain medication is only a temporary fix to the problem.  He is to follow-up with a neurosurgeon.  Patient discussed in length about not having enough money to follow-up with a neurosurgeon.  Patient states she was also given a referral to pain management.   This appears to be a chronic issue.  Given overall well appearance with normal neurological exam have low suspicion for any acute findings.  I do not feel that patient needs emergent MRI or surgical intervention at this time.  I do not feel that any further steroids, muscle relaxers or anti-inflammatories are going to help patient.  I discussed the importance of stretching.  Patient has no lower extremity edema or calf tenderness no be concerning for DVT.  I discussed with patient that I would talk to case manager to see they can help not find a primary care and neurosurgery follow-up.  I instructed patient that I will give him a short course of tramadol for pain however I cannot prescribe any other narcotic pain medications.  Discussed with patient that further providers will likely not provide any pain medications for his chronic back pain.  I spoke with case manager who will come talk to patient.  States that he has an appointment with [facility] 27 January.  States that he can fill out application for orange card to go see neurosurgery.  I have reviewed the patient's information in the [facility] for the past 12 months and found them to have 2 Tylenol 3 prescriptions and muscle relaxers.  Opiates were prescribed for an acute on chronic, painful condition. The patient was given information on side effects and encouraged to use other, non-opiate pain medication primary, only using opiate medicine sparingly for severe pain.   Pt is hemodynamically stable, in NAD, & able to ambulate in the ED. Evaluation does not show pathology that would require ongoing emergent intervention or inpatient treatment. I explained the diagnosis to the patient. Pain has been managed & has no complaints prior to dc. Pt is comfortable with above plan and is stable for discharge at this time. All questions were answered prior to disposition. Strict return precautions for f/u to  the ED were discussed. Encouraged follow up with PCP.  Final Clinical Impressions(s) / ED Diagnoses   Final diagnoses:  Chronic right-sided low back pain with right-sided sciatica    ED Discharge Orders        Ordered    traMADol (ULTRAM) 50 MG tablet  Every 6 hours PRN     08/07/17 1111       Ocie Cornfield  T, PA-C 08/07/17 1112    Doristine Devoid, PA-C 08/07/17 1118    Nat Christen, MD 08/10/17 1355

## 2017-08-07 NOTE — ED Triage Notes (Signed)
Pt reports history of sciatica with right hip and leg pain, Pt states has been seen for this same pain with no relief. Pt reports being on muscle relaxants and feels he need stronger pain medication.

## 2017-08-07 NOTE — Discharge Planning (Signed)
[facility] consulted regarding getting follow-up appointment with PCP.  Pt is active with [facility]; he has upcoming appointment 08/20/17 @ 0930.  [facility] will remind him that he can apply for medical assistance programs at this appointment also. Justin Randall J. Clydene Laming, [place], [place], [place]

## 2017-08-07 NOTE — ED Notes (Signed)
Pt reports sciatic pain down right leg stating that the medications he has been given are not working for him. Pt also reports he did get his increased prescription from Saturday when he was last seen because "those pills are like candy and don't do nothing". Pt states he attempted to call his follow up provider but was unable to see the specialist r/t cost and not having insurance.

## 2017-08-07 NOTE — ED Notes (Signed)
Case management spoke with patient about filling out orange card information to see neurosurgeon.

## 2017-08-19 ENCOUNTER — Ambulatory Visit: Payer: Self-pay | Attending: Internal Medicine | Admitting: Licensed Clinical Social Worker

## 2017-08-19 DIAGNOSIS — F331 Major depressive disorder, recurrent, moderate: Secondary | ICD-10-CM

## 2017-08-19 MED FILL — CYCLOBENZAPRINE 10 MG TAB: 10 | 20 days supply | Qty: 60 | Fill #1

## 2017-08-19 MED FILL — MELOXICAM 15 MG TABLET: 15 | 30 days supply | Qty: 30 | Fill #1

## 2017-08-19 NOTE — BH Specialist Note (Addendum)
Integrated Behavioral Health Initial Visit  MRN: [id number] Name: Justin Randall  Number of [facility] Clinician visits:: 1/6 Session Start time: 10:10 AM  Session End time: 11:10 AM Total time: 1 hour  Type of Service: [place] Interpretor:No. Interpretor Name and Language: N/A   Warm Hand Off Completed.       SUBJECTIVE: Justin Randall is a 47 y.o. male accompanied by Justin Randall, Fundamental Services Coordinator at [facility] Patient was referred by self for chronic pain and assistance with [place] [facility] application for [employer]. Patient reports the following symptoms/concerns: pt reports frustration with having to complete [employer] application. States that he has been at [facility] for 1.5 yrs and was never informed about process. Pt experiences chronic pain and is in need of seeing a specialist as soon as possible.  Duration of problem: Ongoing; Severity of problem: severe  OBJECTIVE: Mood: Angry, Anxious and Depressed and Affect: Depressed and Tearful Risk of harm to self or others: No plan to harm self or others Pt endorsed hx of suicidal ideations; however, denied current SI/HI/AVH. He has no intent or plan to harm self or others. Crisis resources provided.  LIFE CONTEXT: Family and Social: Pt receives emotional and financial support from family. He resides with wife and daughter in law School/Work: Pt is unemployed. He has a Chief Executive Officer to assist with disability claim Self-Care: Pt states that he would like to spend more time with his grandchildren. He smokes marijuana to assist with coping with stressors and ongoing pain Life Changes: Pt is experiencing chronic pain that prevents him from obtaining employment. As a result, pt is experiencing financial strain  GOALS ADDRESSED: Patient will: 1. Reduce symptoms of: anxiety and stress 2. Increase knowledge and/or ability of: coping skills  3. Demonstrate ability to: Increase healthy  adjustment to current life circumstances and Increase adequate support systems for patient/family  INTERVENTIONS: Interventions utilized: Solution-Focused Strategies, Supportive Counseling and Psychoeducation and/or Health Education  Standardized Assessments completed: Pt was not provided screening  ASSESSMENT: Patient currently experiencing anxiety and depression triggered by ongoing medical conditions. He reports chronic pain and current frustration with the referral process to see a specialist. Pt endorsed hx of suicidal ideations; however, denied current SI/HI/AVH. He has no intent or plan to harm self or others. Crisis resources provided. Pt presented to visit with Justin Randall, Fundamental Services Coordinator with the [facility].    Patient may benefit from psychoeducation and psychotherapy. He is participating in medication management through PCP. LCSWA educated pt on the correlation between one's physical and mental health, in addition, to how stress can negatively impact health. Pt's feelings were validated and encouragement was provided. LCSWA discussed therapeutic interventions to promote relaxation and stress management.  Diane Luciana Axe, Financial Counselor spoke with pt and informed him of documentation needed to complete orange card application. Pt was appreciative for the assistance and was scheduled to see a Financial Counselor on 08/20/16  PLAN: 1. Follow up with behavioral health clinician on : Pt was encouraged to contact [facility] if symptoms worsen or fail to improve to schedule behavioral appointments at [facility]. 2. Behavioral recommendations: LCSWA recommends that pt make his scheduled appointment with [facility] on 08/20/16. He was encouraged to contact [place] or PCP with any additional concerns or questions 3. Referral(s): Integrated Services Public house manager) 4. "From scale of 1-10, how likely are you to follow plan?": 10/10  Rebekah [place], LCSW 08/21/16 11:42 AM

## 2017-08-20 ENCOUNTER — Ambulatory Visit: Payer: Self-pay

## 2017-08-20 ENCOUNTER — Other Ambulatory Visit: Payer: Self-pay

## 2017-08-20 ENCOUNTER — Encounter: Payer: Self-pay | Admitting: Internal Medicine

## 2017-08-20 ENCOUNTER — Ambulatory Visit: Payer: Self-pay | Attending: Internal Medicine | Admitting: Internal Medicine

## 2017-08-20 VITALS — BP 126/87 | HR 94 | Temp 98.3°F | Ht 74.0 in | Wt 200.0 lb

## 2017-08-20 DIAGNOSIS — M5440 Lumbago with sciatica, unspecified side: Secondary | ICD-10-CM

## 2017-08-20 DIAGNOSIS — M25551 Pain in right hip: Secondary | ICD-10-CM | POA: Insufficient documentation

## 2017-08-20 DIAGNOSIS — G8929 Other chronic pain: Secondary | ICD-10-CM | POA: Insufficient documentation

## 2017-08-20 DIAGNOSIS — M5126 Other intervertebral disc displacement, lumbar region: Secondary | ICD-10-CM | POA: Insufficient documentation

## 2017-08-20 DIAGNOSIS — R45851 Suicidal ideations: Secondary | ICD-10-CM | POA: Insufficient documentation

## 2017-08-20 DIAGNOSIS — M62838 Other muscle spasm: Secondary | ICD-10-CM | POA: Insufficient documentation

## 2017-08-20 DIAGNOSIS — K259 Gastric ulcer, unspecified as acute or chronic, without hemorrhage or perforation: Secondary | ICD-10-CM | POA: Insufficient documentation

## 2017-08-20 DIAGNOSIS — F4321 Adjustment disorder with depressed mood: Secondary | ICD-10-CM | POA: Insufficient documentation

## 2017-08-20 DIAGNOSIS — M5441 Lumbago with sciatica, right side: Secondary | ICD-10-CM | POA: Insufficient documentation

## 2017-08-20 DIAGNOSIS — Z79899 Other long term (current) drug therapy: Secondary | ICD-10-CM | POA: Insufficient documentation

## 2017-08-20 MED ORDER — CYCLOBENZAPRINE HCL 10 MG PO TABS: ORAL_TABLET | ORAL | 1 refills | Status: DC

## 2017-08-20 MED ORDER — DULOXETINE HCL 30 MG PO CPEP: 30.0000 mg | ORAL_CAPSULE | Freq: Every day | ORAL | 3 refills | Status: DC

## 2017-08-20 MED ORDER — ACETAMINOPHEN-CODEINE #3 300-30 MG PO TABS: 1.0000 | ORAL_TABLET | ORAL | 0 refills | Status: DC | PRN

## 2017-08-20 MED FILL — DULoxetine HCL 30 MG CPEP: 30 | 30 days supply | Qty: 30 | Fill #0

## 2017-08-20 MED FILL — ACETAMINOPHEN/COD #3 TABLET: 300-30 | 15 days supply | Qty: 90 | Fill #0

## 2017-08-20 NOTE — Patient Instructions (Signed)
Chronic Pain, Adult Chronic pain is a type of pain that lasts or keeps coming back (recurs) for at least six months. You may have chronic headaches, abdominal pain, or body pain. Chronic pain may be related to an illness, such as fibromyalgia or complex regional pain syndrome. Sometimes the cause of chronic pain is not known. Chronic pain can make it hard for you to do daily activities. If not treated, chronic pain can lead to other health problems, including anxiety and depression. Treatment depends on the cause and severity of your pain. You may need to work with a pain specialist to come up with a treatment plan. The plan may include medicine, counseling, and physical therapy. Many people benefit from a combination of two or more types of treatment to control their pain. Follow these instructions at home: Lifestyle  Consider keeping a pain diary to share with your health care providers.  Consider talking with a mental health care provider (psychologist) about how to cope with chronic pain.  Consider joining a chronic pain support group.  Try to control or lower your stress levels. Talk to your health care provider about strategies to do this. General instructions   Take over-the-counter and prescription medicines only as told by your health care provider.  Follow your treatment plan as told by your health care provider. This may include: ? Gentle, regular exercise. ? Eating a healthy diet that includes foods such as vegetables, fruits, fish, and lean meats. ? Cognitive or behavioral therapy. ? Working with a Community education officer. ? Meditation or yoga. ? Acupuncture or massage therapy. ? Aroma, color, light, or sound therapy. ? Local electrical stimulation. ? Shots (injections) of numbing or pain-relieving medicines into the spine or the area of pain.  Check your pain level as told by your health care provider. Ask your health care provider if you should use a pain scale.  Learn as  much as you can about how to manage your chronic pain. Ask your health care provider if an intensive pain rehabilitation program or a chronic pain specialist would be helpful.  Keep all follow-up visits as told by your health care provider. This is important. Contact a health care provider if:  Your pain gets worse.  You have new pain.  You have trouble sleeping.  You have trouble doing your normal activities.  Your pain is not controlled with treatment.  Your have side effects from pain medicine.  You feel weak. Get help right away if:  You lose feeling or have numbness in your body.  You lose control of bowel or bladder function.  Your pain suddenly gets much worse.  You develop shaking or chills.  You develop confusion.  You develop chest pain.  You have trouble breathing or shortness of breath.  You pass out.  You have thoughts about hurting yourself or others. This information is not intended to replace advice given to you by your health care provider. Make sure you discuss any questions you have with your health care provider. Document Released: 04/13/2002 Document Revised: 03/21/2016 Document Reviewed: 01/09/2016 Elsevier Interactive Patient Education  2018 [employer]. Adjustment Disorder, Adult Adjustment disorder is a group of symptoms that can develop after a stressful life event, such as the loss of a job or serious physical illness. The symptoms can affect how you feel, think, and act. They may interfere with your relationships. Adjustment disorder increases your risk of suicide and substance abuse. If this disorder is not managed early, it can develop  into a more serious condition, such as major depressive disorder or post-traumatic stress disorder. What are the causes? This condition happens when you have trouble recovering from or coping with a stressful life event. What increases the risk? You are more likely to develop this condition if:  You have  had depression or anxiety.  You are being treated for a long-term (chronic) illness.  You are being treated for an illness that cannot be cured (terminal illness).  You have a family history of mental illness.  What are the signs or symptoms? Symptoms of this condition include:  Extreme trouble doing daily tasks, such as going to work.  Sadness, depression, or crying spells.  Worrying a lot.  Loss of enjoyment.  Change in appetite or weight.  Feelings of loss or hopelessness.  Thoughts of suicide.  Anxiety, worry, or nervousness.  Trouble sleeping.  Avoiding family and friends.  Fighting or vandalism.  Complaining of feeling sick without being ill.  Feeling dazed or disconnected.  Nightmares.  Trouble sleeping.  Irritability.  Reckless driving.  Poor work Systems analyst.  Ignoring bills.  Symptoms of this condition start within three months of the stressful event. They do not last more than six months, unless the stressful circumstances last longer. Normal grieving after the death of a loved one is not a symptom of this condition. How is this diagnosed? To diagnose this condition, your health care provider will ask about what has happened in your life and how it has affected you. He or she may also ask about your medical history and your use of medicines, alcohol, and other substances. Your health care provider may do a physical exam and order lab tests or other studies. You may be referred to a mental health specialist. How is this treated? Treatment options for this condition include:  Counseling or talk therapy. Talk therapy is usually provided by mental health specialists.  Medicines. Certain medicines may help with depression, anxiety, and sleep.  Support groups. These offer emotional support, advice, and guidance. They are made up of people who have had similar experiences.  Observation and time. This is sometimes called "watchful waiting." In this  treatment, health care providers monitor your health and behavior without other treatment. Adjustment disorder sometimes gets better on its own with time.  Follow these instructions at home:  Take over-the-counter and prescription medicines only as told by your health care provider.  Keep all follow-up visits as told by your health care provider. This is important. Contact a health care provider if:  Your symptoms do not improve in six months.  Your symptoms get worse. Get help right away if:  You have serious thoughts about hurting yourself or someone else. If you ever feel like you may hurt yourself or others, or have thoughts about taking your own life, get help right away. You can go to your nearest emergency department or call:  Your local emergency services (911 in the [place]  A suicide crisis helpline, such as the [place] at 614 784 8843. This is open 24 hours a day.  Summary  Adjustment disorder is a group of symptoms that can develop after a stressful life event, such as the loss of a job or serious physical illness. The symptoms can affect how you feel, think, and act. They may interfere with your relationships.  Symptoms of this condition start within three months of the stressful event. They do not last more than six months, unless the stressful circumstances last longer.  Treatment may include talk therapy, medicines, participation in a support group, or observation to see if symptoms improve.  Contact your health care provider if your symptoms get worse or do not improve in six months.  If you ever feel like you may hurt yourself or others, or have thoughts about taking your own life, get help right away. This information is not intended to replace advice given to you by your health care provider. Make sure you discuss any questions you have with your health care provider. Document Released: 03/26/2006 Document Revised: 09/20/2016 Document  Reviewed: 09/20/2016 Elsevier Interactive Patient Education  [employer].

## 2017-08-20 NOTE — Progress Notes (Signed)
Right hip pain

## 2017-08-20 NOTE — Progress Notes (Signed)
Subjective:  Patient ID: Justin Randall, male    DOB: Dec 09, 1970  Age: 47 y.o. MRN: [id number]  CC: Hip Pain  HPI Tabias Swayze is 47 y/o [place] American male with medical history of chronic low back pain with sciatica and muscle spasms; adjustment disorder with depression.   Patient presents today with c/o worsening right hip pain radiating down to his ankle. Reports sharp pain (20/10) that is constant with paresthesia present. Denies loss of bowel/bladder, recent injury, or falls. He uses an unprescribed wooden cane as an assistance device. Last MRI (05/27/17) impression: Right subarticular disc protrusion at L5-S1, impinging upon the descending S1 nerve root. Broad left subarticular/foraminal disc protrusion at L4-5,contacting and slightly displacing the descending left L5 nerve root in the left lateral recess.   Reports minimal relief with previously prescribed pain medications and antispasmodics. Last seen at local emergency department (08/07/17); prescribed Tramadol. Per patient, he is unable to sleep (2-3 hrs per night) and reduced daily activity due to chronic pain. Patient reports increased depression with intermittment suicidal ideations with no verbalized plan. Denies SI/HI at this time.    Past Medical History:  Diagnosis Date  . Sciatica   . Ulcer of the stomach and intestine    No past surgical history on file.  Outpatient Medications Prior to Visit  Medication Sig Dispense Refill  . acetaminophen-codeine (TYLENOL #3) 300-30 MG tablet Take 1 tablet by mouth every 4 (four) hours as needed. 60 tablet 0  . cyclobenzaprine (FLEXERIL) 10 MG tablet TAKE 1/2-1 TABLET BY MOUTH 3 TIMES DAILY AS NEEDED FOR MUSCLE SPASMS. 30 tablet 1  . traMADol (ULTRAM) 50 MG tablet Take 1 tablet (50 mg total) by mouth every 6 (six) hours as needed. 8 tablet 0  . lidocaine (LIDODERM) 5 % Place 1 patch onto the skin daily. Remove & Discard patch within 12 hours or as directed by MD (Patient not taking:  Reported on 08/20/2017) 30 patch 0  . meloxicam (MOBIC) 15 MG tablet Take 1 tablet (15 mg total) by mouth daily. 30 tablet 3  . methocarbamol (ROBAXIN) 500 MG tablet Take 1 tablet (500 mg total) by mouth every 6 (six) hours as needed for muscle spasms. (Patient not taking: Reported on 08/20/2017) 30 tablet 0  . predniSONE (DELTASONE) 10 MG tablet Take 1 tablet (10 mg total) by mouth daily. Take 6 tabs by mouth daily  for 1 days, then 5 tabs for 2 days, then 4 tabs for 2 days, then 3 tabs for 2 days, 2 tabs for 2 days, then 1 tab by mouth daily for 2 days (Patient not taking: Reported on 08/20/2017) 36 tablet 0   No facility-administered medications prior to visit.    No Known Allergies  ROS Review of Systems  Constitutional: Positive for activity change. Negative for appetite change, fatigue and unexpected weight change.  Musculoskeletal: Positive for gait problem and myalgias. Negative for neck pain and neck stiffness.  Neurological: Positive for numbness. Negative for tremors and weakness.  Psychiatric/Behavioral: Positive for dysphoric mood, sleep disturbance and suicidal ideas (intermittent/passive).  All other systems reviewed and are negative.   Objective:  BP 126/87 (BP Location: Right Arm, Patient Position: Sitting, Cuff Size: Normal)   Pulse 94   Temp 98.3 F (36.8 C) (Oral)   Ht 6\' 2"  (1.88 m)   Wt 200 lb (90.7 kg)   SpO2 98%   BMI 25.68 kg/m   BP/Weight 08/20/2017 08/07/2017 84/69/6295  Systolic BP 284 132 440  Diastolic BP 87  96 70  Wt. (Lbs) 200 - -  BMI 25.68 - -    Physical Exam  Constitutional: He is oriented to person, place, and time. He appears well-developed and well-nourished.  HENT:  Head: Normocephalic and atraumatic.  Eyes: Conjunctivae and EOM are normal.  Neck: Normal range of motion and full passive range of motion without pain. Neck supple.  Cardiovascular: Normal rate, S1 normal and S2 normal.  Pulses:      Radial pulses are 2+ on the right side, and  2+ on the left side.  Pulmonary/Chest: Effort normal and breath sounds normal.  Abdominal: Soft. There is no tenderness.  Musculoskeletal:       Lumbar back: He exhibits decreased range of motion and tenderness. He exhibits no deformity.  Negative straight leg raised test  Neurological: He is alert and oriented to person, place, and time. He has normal strength. Gait normal.  Skin: Skin is warm and dry.  Psychiatric: His speech is normal and behavior is normal. Judgment and thought content normal. Cognition and memory are normal. He exhibits a depressed mood.  Nursing note and vitals reviewed.   Assessment & Plan:   1. Acute right-sided low back pain with right-sided sciatica - cyclobenzaprine (FLEXERIL) 10 MG tablet; TAKE 1/2-1 TABLET BY MOUTH 3 TIMES DAILY AS NEEDED FOR MUSCLE SPASMS.  Dispense: 30 tablet; Refill: 1  2. Chronic right-sided low back pain with sciatica, sciatica laterality unspecified, Uncontrolled - Education provided about increased physical activity with back exercises and slow moderate      movements to increased back muscular strength.  - Referral to PT/OT for back exercises and further teaching.  - acetaminophen-codeine (TYLENOL #3) 300-30 MG tablet; Take 1 tablet by mouth every 4 (four) hours as needed.  Dispense: 90 tablet; Refill: 0  3. Adjustment disorder with depressed mood, Uncontrolled  - Start antidepressant for increased depression r/t uncontrolled pain. Follow-up in 6 months.  - Education provided about Depression and to seek immediate medical attention regarding SI. - DULoxetine (CYMBALTA) 30 MG capsule; Take 1 capsule (30 mg total) by mouth daily.  Dispense: 30 capsule; Refill: 3  4. Muscle spasm, Controlled  - Continue medication regimen and follow-up in 6 months.  - cyclobenzaprine (FLEXERIL) 10 MG tablet; TAKE 1/2-1 TABLET BY MOUTH 3 TIMES DAILY AS NEEDED FOR MUSCLE SPASMS.  Dispense: 30 tablet; Refill: 1  Meds ordered this encounter  Medications    . acetaminophen-codeine (TYLENOL #3) 300-30 MG tablet    Sig: Take 1 tablet by mouth every 4 (four) hours as needed.    Dispense:  90 tablet    Refill:  0  . cyclobenzaprine (FLEXERIL) 10 MG tablet    Sig: TAKE 1/2-1 TABLET BY MOUTH 3 TIMES DAILY AS NEEDED FOR MUSCLE SPASMS.    Dispense:  30 tablet    Refill:  1  . DULoxetine (CYMBALTA) 30 MG capsule    Sig: Take 1 capsule (30 mg total) by mouth daily.    Dispense:  30 capsule    Refill:  3    Follow-up: Return in about 6 months (around 02/17/2018) for Follow up Pain and comorbidities.   Krystle H. Hulan Fray, Student DNP  Evaluation and management procedures were performed by me with DNP Student in attendance, note written by DNP student under my supervision and collaboration. I have reviewed the note and I agree with the management and plan.   Angelica Chessman, MD, [place], [place], Karilyn Cota [facility] and [facility] [place], [place]  08/23/2017, 5:40 PM

## 2017-09-16 ENCOUNTER — Other Ambulatory Visit: Payer: Self-pay | Admitting: Internal Medicine

## 2017-09-16 DIAGNOSIS — G8929 Other chronic pain: Secondary | ICD-10-CM

## 2017-09-16 DIAGNOSIS — M5440 Lumbago with sciatica, unspecified side: Principal | ICD-10-CM

## 2017-09-16 MED FILL — ACETAMINOPHEN/COD #3 TABLET: 300-30 | 15 days supply | Qty: 90 | Fill #0

## 2017-09-16 MED FILL — DULoxetine HCL 30 MG CPEP: 30 | 30 days supply | Qty: 30 | Fill #1

## 2017-09-16 MED FILL — ?CYCLOBENZAPRINE 10MG TB: 10 | 10 days supply | Qty: 30 | Fill #0

## 2017-09-17 ENCOUNTER — Telehealth: Payer: Self-pay | Admitting: Internal Medicine

## 2017-09-17 NOTE — Telephone Encounter (Signed)
Pt called to request the status on his OC and wants more details on the [facility] card. Please follow up

## 2017-09-24 ENCOUNTER — Telehealth: Payer: Self-pay | Admitting: Internal Medicine

## 2017-09-24 NOTE — Telephone Encounter (Signed)
PT came by to drop off paperwork for Justin Randall it was put on PCP box.  once completed please call pt to pick up.

## 2017-09-29 ENCOUNTER — Telehealth: Payer: Self-pay | Admitting: Internal Medicine

## 2017-09-29 NOTE — Telephone Encounter (Signed)
Pt called to request the status of his paperwork, he would also like a call back from his nurse because he has some concerns about his new provider.

## 2017-10-03 ENCOUNTER — Telehealth: Payer: Self-pay | Admitting: Internal Medicine

## 2017-10-03 NOTE — Telephone Encounter (Signed)
Paperwork That was received on 09/24/2017 please have it faxed back to  [phone] And leave a copy for pt pickup Please follow up

## 2017-10-06 NOTE — Telephone Encounter (Signed)
Paperwork was placed in the covering providers mailbox for review and completion.

## 2017-10-07 NOTE — Telephone Encounter (Signed)
Patient verified DOB Patient is aware of covering provider reviewing paperwork during admin time and completing the form to the best of her clinical ability with not being the patients PCP.  Patient is aware of all paperwork having a completed time frame of 7-14 business days. Patient also received clarity on the documents still needed to process is CAFA. No further questions at this time.

## 2017-10-21 ENCOUNTER — Other Ambulatory Visit: Payer: Self-pay | Admitting: Internal Medicine

## 2017-10-21 DIAGNOSIS — G8929 Other chronic pain: Secondary | ICD-10-CM

## 2017-10-21 DIAGNOSIS — M5440 Lumbago with sciatica, unspecified side: Principal | ICD-10-CM

## 2017-10-21 MED FILL — CYCLOBENZAPRINE 10 MG TAB: 10 | 10 days supply | Qty: 30 | Fill #1

## 2017-10-21 MED FILL — DULoxetine HCL 30 MG CPEP: 30 | 30 days supply | Qty: 30 | Fill #2

## 2017-10-22 ENCOUNTER — Ambulatory Visit: Payer: Self-pay | Attending: Internal Medicine | Admitting: Internal Medicine

## 2017-10-22 ENCOUNTER — Encounter: Payer: Self-pay | Admitting: Internal Medicine

## 2017-10-22 VITALS — BP 119/83 | HR 87 | Temp 98.4°F | Resp 18 | Ht 74.0 in | Wt 208.8 lb

## 2017-10-22 DIAGNOSIS — M5440 Lumbago with sciatica, unspecified side: Secondary | ICD-10-CM

## 2017-10-22 DIAGNOSIS — M5441 Lumbago with sciatica, right side: Secondary | ICD-10-CM | POA: Insufficient documentation

## 2017-10-22 DIAGNOSIS — F331 Major depressive disorder, recurrent, moderate: Secondary | ICD-10-CM

## 2017-10-22 DIAGNOSIS — K259 Gastric ulcer, unspecified as acute or chronic, without hemorrhage or perforation: Secondary | ICD-10-CM | POA: Insufficient documentation

## 2017-10-22 DIAGNOSIS — G8929 Other chronic pain: Secondary | ICD-10-CM

## 2017-10-22 DIAGNOSIS — F4321 Adjustment disorder with depressed mood: Secondary | ICD-10-CM | POA: Insufficient documentation

## 2017-10-22 NOTE — Progress Notes (Signed)
Justin Randall, is a 47 y.o. male  PYP:[id number]  IWP:[id number]  DOB - Oct 08, 1970  Chief Complaint  Patient presents with  . Other    paperwork      Subjective:   Justin Randall is a 47 y.o. male male with medical history of chronic low back pain with sciatica and muscle spasms; adjustment disorder with depression who presents here today for a follow up visit and for disability examination with paperwork to be filled out. He continues to complain to worsening pain in his lower back radiating to his legs and hip joints. He denies any bowel or bladder incontinence. He is on Tylenol #3 for pain. Patient has No headache, No chest pain, No abdominal pain - No Nausea, No new weakness tingling or numbness, No Cough - SOB.  ALLERGIES: No Known Allergies  PAST MEDICAL HISTORY: Past Medical History:  Diagnosis Date  . Sciatica   . Ulcer of the stomach and intestine     MEDICATIONS AT HOME: Prior to Admission medications   Medication Sig Start Date End Date Taking? Authorizing Provider  acetaminophen-codeine (TYLENOL #3) 300-30 MG tablet TAKE 1 TABLET BY MOUTH EVERY 4 (FOUR) HOURS AS NEEDED. 09/16/17  Yes Cadience Bradfield E, MD  cyclobenzaprine (FLEXERIL) 10 MG tablet TAKE 1/2-1 TABLET BY MOUTH 3 TIMES DAILY AS NEEDED FOR MUSCLE SPASMS. 08/20/17  Yes Tresa Garter, MD  DULoxetine (CYMBALTA) 30 MG capsule Take 1 capsule (30 mg total) by mouth daily. 08/20/17  Yes Tresa Garter, MD  meloxicam (MOBIC) 15 MG tablet Take 1 tablet (15 mg total) by mouth daily. 05/15/17  Yes Argentina Donovan, PA-C  lidocaine (LIDODERM) 5 % Place 1 patch onto the skin daily. Remove & Discard patch within 12 hours or as directed by MD Patient not taking: Reported on 08/20/2017 08/03/17   Petrucelli, Aldona Bar R, PA-C    Objective:   Vitals:   10/22/17 0952  BP: 119/83  Pulse: 87  Resp: 18  Temp: 98.4 F (36.9 C)  TempSrc: Oral  SpO2: 99%  Weight: 208 lb 12.8 oz (94.7 kg)  Height: 6\' 2"  (1.88  m)   Exam General appearance : Awake, alert, not in any distress. Speech Clear. Not toxic looking HEENT: Atraumatic and Normocephalic, pupils equally reactive to light and accomodation Neck: Supple, no JVD. No cervical lymphadenopathy.  Chest: Good air entry bilaterally, no added sounds  CVS: S1 S2 regular, no murmurs.  Abdomen: Bowel sounds present, Non tender and not distended with no gaurding, rigidity or rebound. Extremities: B/L Lower Ext shows no edema, both legs are warm to touch Neurology: Awake alert, and oriented X 3, CN II-XII intact, Non focal Skin: No Rash  Data Review No results found for: HGBA1C  Assessment & Plan   1. Chronic right-sided low back pain with sciatica, sciatica laterality unspecified  Needs paperwork filled out for disability Continue pain medication and PT  2. Moderate episode of recurrent major depressive disorder (Grandfather)  Needs paperwork filled out for disability Continue pain medication and PT  Patient have been counseled extensively about nutrition and exercise. Other issues discussed during this visit include: low cholesterol diet, weight control and daily exercise.Justin Randall   Return in about 6 months (around 04/24/2018) for Follow up Pain and comorbidities.  The patient was given clear instructions to go to ER or return to medical center if symptoms don't improve, worsen or new problems develop. The patient verbalized understanding. The patient was told to call to get lab results if they  haven't heard anything in the next week.   This note has been created with Surveyor, quantity. Any transcriptional errors are unintentional.    Angelica Chessman, MD, MHA, Karilyn Cota, [place] and [facility] [place], [place]   10/22/2017, 10:47 AM

## 2017-10-22 NOTE — Patient Instructions (Signed)

## 2017-10-27 ENCOUNTER — Telehealth: Payer: Self-pay | Admitting: Internal Medicine

## 2017-10-27 NOTE — Telephone Encounter (Signed)
Pt called since he said that he put a request for acetaminophen-codeine (TYLENOL #3) 300-30 MG tablet And was denied the refill, can you find out why an call him back

## 2017-10-27 NOTE — Telephone Encounter (Signed)
Tylenol 3 refill request. Last filled 09/16/17

## 2017-11-26 ENCOUNTER — Telehealth: Payer: Self-pay | Admitting: Internal Medicine

## 2017-11-26 NOTE — Telephone Encounter (Signed)
Pt called to try to speak with the nurse or the pcp about his med, please follow up

## 2017-11-27 NOTE — Telephone Encounter (Signed)
Contacted pt to see what his concern was. Pt stated that the pharmacy sent a refill request for his Tylenol #3 and it was denied. I informed pt that Dr. Doreene Burke is no longer here and he does have an appointment scheduled on 5/3 with Dr. Wynetta Emery to re-establish.

## 2017-12-05 ENCOUNTER — Encounter: Payer: Self-pay | Admitting: Internal Medicine

## 2017-12-05 ENCOUNTER — Ambulatory Visit: Payer: Self-pay | Attending: Internal Medicine | Admitting: Internal Medicine

## 2017-12-05 VITALS — BP 117/81 | HR 88 | Temp 98.5°F | Resp 16 | Ht 74.0 in | Wt 202.0 lb

## 2017-12-05 DIAGNOSIS — F4320 Adjustment disorder, unspecified: Secondary | ICD-10-CM | POA: Insufficient documentation

## 2017-12-05 DIAGNOSIS — F1290 Cannabis use, unspecified, uncomplicated: Secondary | ICD-10-CM | POA: Insufficient documentation

## 2017-12-05 DIAGNOSIS — F17210 Nicotine dependence, cigarettes, uncomplicated: Secondary | ICD-10-CM | POA: Insufficient documentation

## 2017-12-05 DIAGNOSIS — M5440 Lumbago with sciatica, unspecified side: Secondary | ICD-10-CM

## 2017-12-05 DIAGNOSIS — F32A Depression, unspecified: Secondary | ICD-10-CM

## 2017-12-05 DIAGNOSIS — F17200 Nicotine dependence, unspecified, uncomplicated: Secondary | ICD-10-CM | POA: Insufficient documentation

## 2017-12-05 DIAGNOSIS — M5126 Other intervertebral disc displacement, lumbar region: Secondary | ICD-10-CM | POA: Insufficient documentation

## 2017-12-05 DIAGNOSIS — F329 Major depressive disorder, single episode, unspecified: Secondary | ICD-10-CM | POA: Insufficient documentation

## 2017-12-05 DIAGNOSIS — G8929 Other chronic pain: Secondary | ICD-10-CM | POA: Insufficient documentation

## 2017-12-05 DIAGNOSIS — M5127 Other intervertebral disc displacement, lumbosacral region: Secondary | ICD-10-CM | POA: Insufficient documentation

## 2017-12-05 DIAGNOSIS — M5441 Lumbago with sciatica, right side: Secondary | ICD-10-CM | POA: Insufficient documentation

## 2017-12-05 MED ORDER — MELOXICAM 15 MG PO TABS: 15.0000 mg | ORAL_TABLET | Freq: Every day | ORAL | 3 refills | Status: DC

## 2017-12-05 MED ORDER — CYCLOBENZAPRINE HCL 5 MG PO TABS: ORAL_TABLET | ORAL | 2 refills | Status: DC

## 2017-12-05 MED ORDER — ACETAMINOPHEN-CODEINE #3 300-30 MG PO TABS
1.0000 | ORAL_TABLET | Freq: Three times a day (TID) | ORAL | 0 refills | Status: DC | PRN
Start: 2018-01-05 — End: 2018-08-11

## 2017-12-05 MED ORDER — ACETAMINOPHEN-CODEINE #3 300-30 MG PO TABS: 1.0000 | ORAL_TABLET | Freq: Three times a day (TID) | ORAL | 0 refills | Status: DC | PRN

## 2017-12-05 MED ORDER — DULOXETINE HCL 30 MG PO CPEP: 30.0000 mg | ORAL_CAPSULE | Freq: Every day | ORAL | 3 refills | Status: DC

## 2017-12-05 MED FILL — DULoxetine HCL 30 MG CPEP: 30 | 30 days supply | Qty: 30 | Fill #0

## 2017-12-05 MED FILL — CYCLOBENZAPRINE 5 MG TABLET: 5 | 10 days supply | Qty: 30 | Fill #0

## 2017-12-05 MED FILL — MELOXICAM 15 MG TABLET: 15 | 30 days supply | Qty: 30 | Fill #0

## 2017-12-05 MED FILL — ACETAMINOPHEN/COD #3 TABLET: 300-30 | 30 days supply | Qty: 90 | Fill #0

## 2017-12-05 NOTE — Progress Notes (Signed)
Patient ID: Justin Randall, male    DOB: 07-18-1971  MRN: [id number]  CC: re-establish   Subjective: Justin Randall is a 47 y.o. male who presents for chronic ds management and to est care with me.   His concerns today include:  Hx of chronic LBP with sciatica, adjustment disorder  Chronic LBP:  Has not worked in 2 yrs.  He was a Training and development officer.  Use to have intermittent LBP for yrs; would take muscle relaxant and Vicodin for 3 -4 days with relief for several mths.Trying to get the OC so he can see specialist.  He has applied but told that additional documentation was needed which he has with him today.  Has had problems getting through. He has applied for disability. -his pain is getting worse.  Pain more so in lower back.  If he moves the wrong way, pain goes into RT leg.  -little tingling in back.  No incontinence -pain better laying down.  Worse with sitting and walking.  Can walk about 200 feet before he has to stop.  Walks with cane x 2 yrs.  Feels the pain has really restricted his life - sports, walking around the mall, going to the park.  "I'm just missing out on life.  Depressed about going from a well able man to not being able to do what I use to do for my family"  Married and has 1 grown child -Tylenol #3, Flexeril and Cymbalta made pain manageable.  Not sure about Mobic.  Never could afford the lido patch.  -endorse marijuana use.  Few times a mth. Last used 2 wks ago Smokes 1 pk/2 days x 20 yrs.  Never quit.  Does not want to quit.  Does not drink at all    Patient Active Problem List   Diagnosis Date Noted  . Chronic right-sided low back pain with sciatica 09/11/2016     No current outpatient medications on file prior to visit.   No current facility-administered medications on file prior to visit.     No Known Allergies  Social History   Socioeconomic History  . Marital status: Married    Spouse name: Not on file  . Number of children: Not on file  . Years of education:  Not on file  . Highest education level: Not on file  Occupational History  . Not on file  Social Needs  . Financial resource strain: Not on file  . Food insecurity:    Worry: Not on file    Inability: Not on file  . Transportation needs:    Medical: Not on file    Non-medical: Not on file  Tobacco Use  . Smoking status: Current Every Day Smoker    Packs/day: 1.00  . Smokeless tobacco: Never Used  Substance and Sexual Activity  . Alcohol use: No  . Drug use: No  . Sexual activity: Not on file  Lifestyle  . Physical activity:    Days per week: Not on file    Minutes per session: Not on file  . Stress: Not on file  Relationships  . Social connections:    Talks on phone: Not on file    Gets together: Not on file    Attends religious service: Not on file    Active member of club or organization: Not on file    Attends meetings of clubs or organizations: Not on file    Relationship status: Not on file  . Intimate partner violence:  Fear of current or ex partner: Not on file    Emotionally abused: Not on file    Physically abused: Not on file    Forced sexual activity: Not on file  Other Topics Concern  . Not on file  Social History Narrative  . Not on file    No family history on file.  No past surgical history on file.  ROS: Review of Systems As above PHYSICAL EXAM: BP 117/81   Pulse 88   Temp 98.5 F (36.9 C) (Oral)   Resp 16   Ht 6\' 2"  (1.88 m)   Wt 202 lb (91.6 kg)   SpO2 96%   BMI 25.94 kg/m   Physical Exam   General appearance - alert, well appearing, middle-aged African-American male and in no distress Mental status - alert, oriented to person, place, and time Chest - clear to auscultation, no wheezes, rales or rhonchi, symmetric air entry Heart - normal rate, regular rhythm, normal S1, S2, no murmurs, rubs, clicks or gallops Musculoskeletal -no tenderness on palpation of LS spine.  Straight leg raise on both sides causes pain in the lower back.   Power left lower extremity 5 out of 5 proximally and distally.  Right lower extremity 5/5 distally, 4+/5 proximally.  Patient ambulates with a cane Extremities - no LE edema  Depression screen [facility] 2/9 12/05/2017  Decreased Interest 3  Down, Depressed, Hopeless 3  PHQ - 2 Score 6  Altered sleeping 3  Tired, decreased energy 3  Change in appetite 1  Feeling bad or failure about yourself  3  Trouble concentrating 3  Moving slowly or fidgety/restless 0  Suicidal thoughts 0  PHQ-9 Score 19  MRI done 05/2017 IMPRESSION: 1. Right subarticular disc protrusion at L5-S1, impinging upon the descending S1 nerve root in the right lateral recess. 2. Broad left subarticular/foraminal disc protrusion at L4-5, contacting and slightly displacing the descending left L5 nerve root in the left lateral recess. Associated mild left foraminal stenosis without frank left L4 neural impingement.  ASSESSMENT AND PLAN: 1. Chronic right-sided low back pain with sciatica, sciatica laterality unspecified -Patient needs to see neurosurgeon or orthopedics.  He will return in the rest of the requested information to complete his application for the orange card.  Once approved I will submit the referral. -He was on Tylenol 3 from previous PCP.  I think it is reasonable to keep him on this including Flexeril and Cymbalta.  However I have gone over the possible side effects of being on narcotic medications long-term.  I have also gone over our Controlled substance prescribing agreement policy and advised that he will need to discontinue smoking the marijuana.  Urine drug screen will be done today.  If on future random urine drug screen he is positive again for marijuana we may have to discontinue prescribing the narcotics.  He expressed understanding of this.  [place] controlled substance database reviewed.   - acetaminophen-codeine (TYLENOL #3) 300-30 MG tablet; Take 1 tablet by mouth every 8 (eight) hours as needed.   Dispense: 90 tablet; Refill: 0 - acetaminophen-codeine (TYLENOL #3) 300-30 MG tablet; Take 1 tablet by mouth every 8 (eight) hours as needed for moderate pain.  Dispense: 90 tablet; Refill: 0 - cyclobenzaprine (FLEXERIL) 5 MG tablet; TAKE 1 TABLET BY MOUTH 3 TIMES DAILY AS NEEDED FOR MUSCLE SPASMS.  Dispense: 30 tablet; Refill: 2 - DULoxetine (CYMBALTA) 30 MG capsule; Take 1 capsule (30 mg total) by mouth daily.  Dispense: 30 capsule; Refill:  3 - meloxicam (MOBIC) 15 MG tablet; Take 1 tablet (15 mg total) by mouth daily.  Dispense: 30 tablet; Refill: 3 - [id number] 11+Oxyco+Alc+Crt-Bund  2. Tobacco dependence Patient advised to quit smoking. Discussed health risks associated with smoking including lung and other types of cancers, chronic lung diseases and CV risks.. Pt not ready to give trail of quitting.   3. Depression, unspecified depression type - DULoxetine (CYMBALTA) 30 MG capsule; Take 1 capsule (30 mg total) by mouth daily.  Dispense: 30 capsule; Refill: 3  4. Marijuana use, episodic See #1 above  Patient was given the opportunity to ask questions.  Patient verbalized understanding of the plan and was able to repeat key elements of the plan.   Orders Placed This Encounter  Procedures  . [id number] 11+Oxyco+Alc+Crt-Bund     Requested Prescriptions   Signed Prescriptions Disp Refills  . acetaminophen-codeine (TYLENOL #3) 300-30 MG tablet 90 tablet 0    Sig: Take 1 tablet by mouth every 8 (eight) hours as needed.  Marland Kitchen acetaminophen-codeine (TYLENOL #3) 300-30 MG tablet 90 tablet 0    Sig: Take 1 tablet by mouth every 8 (eight) hours as needed for moderate pain.  . cyclobenzaprine (FLEXERIL) 5 MG tablet 30 tablet 2    Sig: TAKE 1 TABLET BY MOUTH 3 TIMES DAILY AS NEEDED FOR MUSCLE SPASMS.  . DULoxetine (CYMBALTA) 30 MG capsule 30 capsule 3    Sig: Take 1 capsule (30 mg total) by mouth daily.  . meloxicam (MOBIC) 15 MG tablet 30 tablet 3    Sig: Take 1 tablet (15 mg total) by mouth  daily.    Return in about 2 months (around 02/04/2018).  Karle Plumber, MD, FACP

## 2017-12-05 NOTE — Patient Instructions (Signed)
Please have patient see financial specialist today.  He has documents to supplement his application for [facility] card

## 2017-12-10 LAB — CANNABINOID CONFIRMATION, UR
CANNABINOIDS, UR: POSITIVE — AB
CARBOXY THC GC/MS CONF: 323 ng/mL

## 2017-12-10 LAB — DRUG SCREEN 764883 11+OXYCO+ALC+CRT-BUND
AMPHETAMINES, URINE: NEGATIVE ng/mL
BARBITURATE: NEGATIVE ng/mL
BENZODIAZ UR QL: NEGATIVE ng/mL
CREATININE: 209.8 mg/dL (ref 20.0–300.0)
Cocaine (Metabolite): NEGATIVE ng/mL
Ethanol: NEGATIVE %
MEPERIDINE: NEGATIVE ng/mL
Methadone Screen, Urine: NEGATIVE ng/mL
OPIATE SCREEN URINE: NEGATIVE ng/mL
Oxycodone/Oxymorphone, Urine: NEGATIVE ng/mL
PHENCYCLIDINE: NEGATIVE ng/mL
Propoxyphene: NEGATIVE ng/mL
TRAMADOL: NEGATIVE ng/mL
pH, Urine: 5.5 (ref 4.5–8.9)

## 2018-08-11 ENCOUNTER — Ambulatory Visit (INDEPENDENT_AMBULATORY_CARE_PROVIDER_SITE_OTHER): Payer: Self-pay

## 2018-08-11 ENCOUNTER — Ambulatory Visit (HOSPITAL_COMMUNITY)
Admission: EM | Admit: 2018-08-11 | Discharge: 2018-08-11 | Disposition: A | Discharge status: Home / Self Care | Payer: Self-pay | Attending: Emergency Medicine | Admitting: Emergency Medicine

## 2018-08-11 ENCOUNTER — Encounter (HOSPITAL_COMMUNITY): Payer: Self-pay

## 2018-08-11 DIAGNOSIS — M5441 Lumbago with sciatica, right side: Secondary | ICD-10-CM

## 2018-08-11 DIAGNOSIS — G8929 Other chronic pain: Secondary | ICD-10-CM | POA: Insufficient documentation

## 2018-08-11 DIAGNOSIS — M545 Low back pain: Secondary | ICD-10-CM

## 2018-08-11 MED ORDER — HYDROCODONE-ACETAMINOPHEN 5-325 MG PO TABS: 1.0000 | ORAL_TABLET | Freq: Four times a day (QID) | ORAL | 0 refills | Status: DC | PRN

## 2018-08-11 MED ORDER — KETOROLAC TROMETHAMINE 30 MG/ML IJ SOLN
30.0000 mg | Freq: Once | INTRAMUSCULAR | Status: AC
  Administered 2018-08-11: 30 mg via INTRAMUSCULAR

## 2018-08-11 MED ORDER — TIZANIDINE HCL 4 MG PO TABS: 4.0000 mg | ORAL_TABLET | Freq: Three times a day (TID) | ORAL | 0 refills | Status: DC | PRN

## 2018-08-11 MED ORDER — KETOROLAC TROMETHAMINE 30 MG/ML IJ SOLN
INTRAMUSCULAR | Status: AC
  Filled 2018-08-11: qty 1

## 2018-08-11 MED ORDER — IBUPROFEN 600 MG PO TABS: 600.0000 mg | ORAL_TABLET | Freq: Four times a day (QID) | ORAL | 0 refills | Status: DC | PRN

## 2018-08-11 MED ORDER — ACETAMINOPHEN 325 MG PO TABS
ORAL_TABLET | ORAL | Status: AC
  Filled 2018-08-11: qty 3

## 2018-08-11 MED ORDER — ACETAMINOPHEN 325 MG PO TABS
975.0000 mg | ORAL_TABLET | Freq: Once | ORAL | Status: AC
  Administered 2018-08-11: 975 mg via ORAL

## 2018-08-11 MED ORDER — METHYLPREDNISOLONE 4 MG PO TBPK: ORAL_TABLET | Freq: Every day | ORAL | 0 refills | Status: DC

## 2018-08-11 MED FILL — IBUPROFEN 600 MG TABLET: 600 | 7 days supply | Qty: 30 | Fill #0

## 2018-08-11 MED FILL — METHYLPREDNISOLONE 4 MG TAB: 4 | 6 days supply | Qty: 21 | Fill #0

## 2018-08-11 MED FILL — tiZANidine HCL 4 MG TABS: 4 | 10 days supply | Qty: 30 | Fill #0

## 2018-08-11 NOTE — ED Triage Notes (Signed)
Pt presents with complaints of back pain worse on the right side. History of sciatic issues. Denies any injury.

## 2018-08-11 NOTE — Discharge Instructions (Signed)
Your x-rays showed that you have degenerative changes in your lower lumbar spine, but nothing new. finish the Medrol Dosepak, take ibuprofen 600 mg combined with Tylenol containing product together 3 or 4 times a day as needed for pain, Zanaflex.  May either take 600 mg of ibuprofen combined with 1 g of Tylenol or take 600 mg of ibuprofen combined with 1-2 tabs of Norco for severe pain.  Do not take Tylenol and Norco, as they both have Tylenol in them and too much Tylenol can hurt your liver.  Do not exceed 4 g of Tylenol from all sources in 1 day.  Placed a referral for physical therapy, however, you may need to call and arrange an appointment.  You will also need to arrange an appointment with [place] [facility].  I have given you information on pain clinics in the area as well.

## 2018-08-11 NOTE — ED Provider Notes (Signed)
HPI  SUBJECTIVE:  Justin Randall is a 48 y.o. male who presents with an exacerbation of his chronic right-sided back pain with radiation down his posterior right leg lasting for the past 2 months.  He states that the sciatic pain is sharp, constant, and the back pain was intermittent, but has now become daily.  He reports muscle spasms.  No trauma, change in his physical activity, lifting, fevers, saddle anesthesia, leg weakness, urinary or fecal incontinence, urinary retention, pain worse at night, unintentional weight loss, urinary complaints, bilateral radicular pain, numbness or tingling in his legs.  He has had this chronic right-sided back pain/sciatica for 4 years, and states that it is not changed in severity but is increasing in frequency.  He states that he had an MRI in October 2018 which showed degenerative disc disease.  He has tried Tylenol 3, Flexeril, occasionally 200 to 400 mg of ibuprofen.  He states narcotics are not working for him.  No alleviating factors.  Symptoms are worse with bending forward, twisting, walking.  He has been seen by his primary care physician for this, and the original plan was to send him to PT, but patient states that nobody ever called him to arrange this.  He is wanting to know what his next step is.  He has a past medical history of gastric ulcers which were cured in 1994, chronic right-sided low back pain with sciatica.  No history of GI bleed, diabetes, hypertension, cancer, multiple myeloma, HIV, IV drug use.  [facility]   Past Medical History:  Diagnosis Date  . Sciatica   . Ulcer of the stomach and intestine     History reviewed. No pertinent surgical history.  Family History  Problem Relation Age of Onset  . Healthy Mother     Social History   Tobacco Use  . Smoking status: Current Every Day Smoker    Packs/day: 1.00  . Smokeless tobacco: Never Used  Substance Use Topics  . Alcohol use: No  . Drug use: No     No current facility-administered medications for this encounter.   Current Outpatient Medications:  .  DULoxetine (CYMBALTA) 30 MG capsule, Take 1 capsule (30 mg total) by mouth daily., Disp: 30 capsule, Rfl: 3 .  HYDROcodone-acetaminophen (NORCO/VICODIN) 5-325 MG tablet, Take 1-2 tablets by mouth every 6 (six) hours as needed for moderate pain or severe pain., Disp: 12 tablet, Rfl: 0 .  ibuprofen (ADVIL,MOTRIN) 600 MG tablet, Take 1 tablet (600 mg total) by mouth every 6 (six) hours as needed., Disp: 30 tablet, Rfl: 0 .  methylPREDNISolone (MEDROL DOSEPAK) 4 MG TBPK tablet, Take by mouth daily. Follow package instructions, Disp: 21 tablet, Rfl: 0 .  tiZANidine (ZANAFLEX) 4 MG tablet, Take 1 tablet (4 mg total) by mouth every 8 (eight) hours as needed for muscle spasms., Disp: 30 tablet, Rfl: 0  No Known Allergies   ROS  As noted in HPI.   Physical Exam  BP 109/80   Pulse 64   Temp 98.3 F (36.8 C)   Resp 20   SpO2 100%   Constitutional: Well developed, well nourished, no acute distress Eyes:  EOMI, conjunctiva normal bilaterally HENT: Normocephalic, atraumatic,mucus membranes moist Respiratory: Normal inspiratory effort Cardiovascular: Normal rate GI: nondistended. No suprapubic tenderness skin: No rash, skin intact Musculoskeletal: no CVAT. + Right-sided paralumbar tenderness, +  muscle spasm. No bony tenderness over L-spine or along the SI joint.. Bilateral lower extremities nontender, baseline ROM with intact DP   pulses. No pain with int/ext rotation extension hips bilaterally.  Pain aggravated with right hip flexion against resistance.  SLR positive right side. Sensation baseline light touch bilaterally for Pt, DTR's symmetric and intact bilaterally KJ, Motor symmetric bilateral 5/5 hip flexion, quadriceps, hamstrings, EHL, foot dorsiflexion, foot plantarflexion, gait somewhat antalgic but without apparent new ataxia. Neurologic: Alert & oriented x 3, no focal neuro  deficits Psychiatric: Speech and behavior appropriate   ED Course   Medications  ketorolac (TORADOL) 30 MG/ML injection 30 mg (30 mg Intramuscular Given 08/11/18 1039)  acetaminophen (TYLENOL) tablet 975 mg (975 mg Oral Given 08/11/18 1039)    Orders Placed This Encounter  Procedures  . DG Lumbar Spine Complete    Standing Status:   Standing    Number of Occurrences:   1    Order Specific Question:   Reason for Exam (SYMPTOM  OR DIAGNOSIS REQUIRED)    Answer:   chronic back pain R side  . Ambulatory referral to Physical Therapy    Referral Priority:   Routine    Referral Type:   Physical Medicine    Referral Reason:   Specialty Services Required    Requested Specialty:   Physical Therapy    Number of Visits Requested:   1    No results found for this or any previous visit (from the past 24 hour(s)). Dg Lumbar Spine Complete  Result Date: 08/11/2018 CLINICAL DATA:  Chronic low back pain.  Sciatica. EXAM: LUMBAR SPINE - COMPLETE 4+ VIEW COMPARISON:  MRI 05/27/2017 FINDINGS: Degenerative disc and facet disease in the lower lumbar spine. Normal alignment. No fracture. SI joints symmetric and unremarkable. IMPRESSION: Degenerative disc and facet disease in the lower lumbar spine. No acute findings. Electronically Signed   By: Rolm Baptise M.D.   On: 08/11/2018 10:42    ED Clinical Impression  Chronic right-sided low back pain with right-sided sciatica   ED Assessment/Plan  [place] Narcotic database reviewed for this patient, and feel that the risk/benefit ratio today is favorable for proceeding with a prescription for controlled substance. Pt with no opiate rx since 12/15/2017.  Patient appears uncomfortable, will give him a shot of Toradol and a gram of Tylenol.  Will check lumbar spine x-rays as his current flare has been going on for 2 months.  Previous outside records reviewed.  Patient last seen for right sided back pain with sciatica in January 2019 by his primary care physician.   He was sent home with Flexeril, Tylenol 3.  Reviewed imaging independently.  No acute changes.  Positive degenerative disc and facet disease in the lower lumbar spine. see radiology report for full details.  Discussed with patient that he needs to see a spine specialist at this point in time, will refer to [place] [facility].  We will also give resources for pain clinics.  Will order referral to PT.  will also send him home with a Medrol Dosepak, ibuprofen 600 mg combined with 1 g of Tylenol together 3 or 4 times a day as needed for pain, Zanaflex. Will Try a short course of Norco.  Pt ambulatory in the UC. Pt to f/u with ortho spine and PT.  Discussed medical decision-making, and plan for follow-up with the patient.  Discussed signs and symptoms that should prompt return to the emergency department.  Patient agrees with plan..   Meds ordered this encounter  Medications  . ketorolac (TORADOL) 30 MG/ML injection 30 mg  . acetaminophen (TYLENOL) tablet 975 mg  . HYDROcodone-acetaminophen (  NORCO/VICODIN) 5-325 MG tablet    Sig: Take 1-2 tablets by mouth every 6 (six) hours as needed for moderate pain or severe pain.    Dispense:  12 tablet    Refill:  0  . ibuprofen (ADVIL,MOTRIN) 600 MG tablet    Sig: Take 1 tablet (600 mg total) by mouth every 6 (six) hours as needed.    Dispense:  30 tablet    Refill:  0  . tiZANidine (ZANAFLEX) 4 MG tablet    Sig: Take 1 tablet (4 mg total) by mouth every 8 (eight) hours as needed for muscle spasms.    Dispense:  30 tablet    Refill:  0  . methylPREDNISolone (MEDROL DOSEPAK) 4 MG TBPK tablet    Sig: Take by mouth daily. Follow package instructions    Dispense:  21 tablet    Refill:  0    *This clinic note was created using Dragon dictation software. Therefore, there may be occasional mistakes despite careful proofreading.  ?    Mortenson, Ashley, MD 08/12/18 1254  

## 2019-03-22 ENCOUNTER — Ambulatory Visit (INDEPENDENT_AMBULATORY_CARE_PROVIDER_SITE_OTHER): Payer: Medicare Other

## 2019-03-22 ENCOUNTER — Encounter (HOSPITAL_COMMUNITY): Payer: Self-pay

## 2019-03-22 ENCOUNTER — Ambulatory Visit (HOSPITAL_COMMUNITY)
Admission: EM | Admit: 2019-03-22 | Discharge: 2019-03-22 | Disposition: A | Discharge status: Home / Self Care | Payer: Medicare Other | Attending: Emergency Medicine | Admitting: Emergency Medicine

## 2019-03-22 ENCOUNTER — Other Ambulatory Visit: Payer: Self-pay

## 2019-03-22 DIAGNOSIS — M5440 Lumbago with sciatica, unspecified side: Secondary | ICD-10-CM

## 2019-03-22 DIAGNOSIS — G8929 Other chronic pain: Secondary | ICD-10-CM

## 2019-03-22 DIAGNOSIS — F17200 Nicotine dependence, unspecified, uncomplicated: Secondary | ICD-10-CM

## 2019-03-22 DIAGNOSIS — R079 Chest pain, unspecified: Secondary | ICD-10-CM

## 2019-03-22 DIAGNOSIS — R221 Localized swelling, mass and lump, neck: Secondary | ICD-10-CM

## 2019-03-22 MED ORDER — TIZANIDINE HCL 4 MG PO TABS: 4.0000 mg | ORAL_TABLET | Freq: Three times a day (TID) | ORAL | 0 refills | Status: DC | PRN

## 2019-03-22 MED ORDER — IBUPROFEN 600 MG PO TABS: 600.0000 mg | ORAL_TABLET | Freq: Four times a day (QID) | ORAL | 0 refills | Status: DC | PRN

## 2019-03-22 MED FILL — IBUPROFEN 600 MG TABLET: 600 | 7 days supply | Qty: 30 | Fill #0

## 2019-03-22 MED FILL — tiZANidine HCL 4 MG TABS: 4 | 10 days supply | Qty: 30 | Fill #0

## 2019-03-22 NOTE — ED Provider Notes (Signed)
[facility]    CSN: [id number] Arrival date & time: 03/22/19  0347     History   Chief Complaint Chief Complaint  Patient presents with  . Chest Pain    HPI Justin Randall is a 48 y.o. male history of previous stomach ulcer, sciatica, tobacco use presenting today for evaluation of chest pain.  Patient has had intermittent sharp stabbing chest pain multiple times throughout the day over the past week.  He relates last Saturday, approximately 10 days ago he had an episode where he felt very dizzy and lightheaded, had some mild vision changes, this resolved with time.  And since he has had intermittent chest discomfort.  Notes that he will feel it when he is sitting upright, on his right side or lying down.  Denies any relieving factors.  Denies any burning or tearing sensations.  Denies associated cough or shortness of breath.  Has had some abdominal pain today, but describes this as more of the sensation/urge to use the restroom.  Has had some occasional nausea, but this is not been persistent.  He has tried Tums without relief.  He has also noticed some right-sided neck swelling since all of this has been occurring.  Denies any fevers chills or body aches.  Denies cough, congestion or sore throat.  Denies previous DVT/PE.  Denies leg pain or leg swelling.  Denies recent travel or immobilization.  HPI  Past Medical History:  Diagnosis Date  . Sciatica   . Ulcer of the stomach and intestine     Patient Active Problem List   Diagnosis Date Noted  . Tobacco dependence 12/05/2017  . Depression 12/05/2017  . 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 No pertinent surgical history.     Home Medications    Prior to Admission medications   Medication Sig Start Date End Date Taking? Authorizing Provider  DULoxetine (CYMBALTA) 30 MG capsule Take 1 capsule (30 mg total) by mouth daily. 12/05/17    [name], MD  HYDROcodone-acetaminophen (NORCO/VICODIN) 5-325 MG tablet Take 1-2 tablets by mouth every 6 (six) hours as needed for moderate pain or severe pain. 08/11/18   Melynda Ripple, MD  ibuprofen (ADVIL) 600 MG tablet Take 1 tablet (600 mg total) by mouth every 6 (six) hours as needed. 03/22/19   ,  C, PA-C  methylPREDNISolone (MEDROL DOSEPAK) 4 MG TBPK tablet Take by mouth daily. Follow package instructions 08/11/18   Melynda Ripple, MD  tiZANidine (ZANAFLEX) 4 MG tablet Take 1 tablet (4 mg total) by mouth every 8 (eight) hours as needed for muscle spasms. 03/22/19   , Elesa Hacker, PA-C    Family History Family History  Problem Relation Age of Onset  . Healthy Mother     Social History Social History   Tobacco Use  . Smoking status: Current Every Day Smoker    Packs/day: 1.00  . Smokeless tobacco: Never Used  Substance Use Topics  . Alcohol use: No  . Drug use: No     Allergies   Patient has no known allergies.   Review of Systems Review of Systems  Constitutional: Negative for activity change, appetite change, chills, fatigue and fever.  HENT: Negative for congestion, ear pain, rhinorrhea, sinus pressure, sore throat and trouble swallowing.   Eyes: Negative for discharge and redness.  Respiratory: Negative for cough, chest tightness and shortness of breath.   Cardiovascular: Positive for chest pain. Negative for  leg swelling.  Gastrointestinal: Positive for abdominal pain and nausea. Negative for diarrhea and vomiting.  Musculoskeletal: Negative for myalgias.  Skin: Negative for rash.  Neurological: Negative for dizziness, light-headedness and headaches.     Physical Exam Triage Vital Signs ED Triage Vitals  Enc Vitals Group     BP 03/22/19 0946 (!) 126/94     Pulse Rate 03/22/19 0946 85     Resp 03/22/19 0946 18     Temp 03/22/19 0946 98.9 F (37.2 C)     Temp src --      SpO2 03/22/19 0946 99 %     Weight --      Height --       Head Circumference --      Peak Flow --      Pain Score 03/22/19 0942 0     Pain Loc --      Pain Edu? --      Excl. in [place]? --    No data found.  Updated Vital Signs BP (!) 126/94   Pulse 85   Temp 98.9 F (37.2 C)   Resp 18   SpO2 99%   Visual Acuity Right Eye Distance:   Left Eye Distance:   Bilateral Distance:    Right Eye Near:   Left Eye Near:    Bilateral Near:     Physical Exam Vitals signs and nursing note reviewed.  Constitutional:      Appearance: He is well-developed.     Comments: Sitting comfortably on exam table, no acute distress  HENT:     Head: Normocephalic and atraumatic.     Ears:     Comments: Bilateral ears without tenderness to palpation of external auricle, tragus and mastoid, EAC's without erythema or swelling, TM's with good bony landmarks and cone of light. Non erythematous.     Mouth/Throat:     Comments: Oral mucosa pink and moist, no tonsillar enlargement or exudate. Posterior pharynx patent and nonerythematous, no uvula deviation or swelling. Normal phonation.  Eyes:     Extraocular Movements: Extraocular movements intact.     Conjunctiva/sclera: Conjunctivae normal.     Pupils: Pupils are equal, round, and reactive to light.  Neck:     Musculoskeletal: Neck supple.     Comments: Right side of neck.  Swelling extending into supraclavicular area, palpable posterior cervical chain lymphadenopathy Cardiovascular:     Rate and Rhythm: Normal rate and regular rhythm.     Heart sounds: No murmur.     Comments: No carotid bruits auscultated Pulmonary:     Effort: Pulmonary effort is normal. No respiratory distress.     Breath sounds: Normal breath sounds.     Comments: Breathing comfortably at rest, CTABL, no wheezing, rales or other adventitious sounds auscultated Abdominal:     Palpations: Abdomen is soft.     Tenderness: There is no abdominal tenderness.  Musculoskeletal:     Comments: Bilateral lower extremities symmetric, no  calf tenderness or overlying erythema or edema  Skin:    General: Skin is warm and dry.  Neurological:     General: No focal deficit present.     Mental Status: He is alert and oriented to person, place, and time. Mental status is at baseline.     Comments: Speech clear      UC Treatments / Results  Labs (all labs ordered are listed, but only abnormal results are displayed) Labs Reviewed - No data to display  EKG   Radiology  Dg Chest 2 View  Result Date: 03/22/2019 CLINICAL DATA:  Chest pain EXAM: CHEST - 2 VIEW COMPARISON:  None. FINDINGS: There is scarring and bullous disease in the right apex. Lungs elsewhere are clear. Heart size and pulmonary vascularity are normal. No adenopathy. No bone lesions. No evident pneumothorax. There are scattered rounded metallic foreign bodies. IMPRESSION: Scarring and bullous disease right apex. Lungs elsewhere clear. Cardiac silhouette within normal limits. Electronically Signed   By: Lowella Grip III M.D.   On: 03/22/2019 10:40    Procedures Procedures (including critical care time)  Medications Ordered in UC Medications - No data to display  Initial Impression / Assessment and Plan / UC Course  I have reviewed the triage vital signs and the nursing notes.  Pertinent labs & imaging results that were available during my care of the patient were reviewed by me and considered in my medical decision making (see chart for details).     EKG normal sinus rhythm, no acute signs of ischemia or infarction.  Chest x-ray with scarring and bulla to right apex.  No widened mediastinum.  Patient likely needs CT scan of chest/neck for further evaluation of swelling and cause of pain.  Recommending outpatient follow-up at this time with cardiology, pulmonology and/or primary care.  Advised patient to not delay this.  Advised to follow-up in emergency room if neck swelling worsening, developing persistent pain that does not resolve on its own or symptoms  progressing.Discussed strict return precautions. Patient verbalized understanding and is agreeable with plan.  Provided refills of Zanaflex and ibuprofen for his chronic back pain.  Sent in ambulatory referral to physical therapy.  Advised to follow-up with primary care for further evaluation of this as he feels his medicines have not been helping. Final Clinical Impressions(s) / UC Diagnoses   Final diagnoses:  Chest pain, unspecified type  Neck swelling     Discharge Instructions     EKG normal Chest xray without obvious cause of symptoms- did show some scarring in right upper lung  Please follow up with primary, pulmonology, or cardiology for further evaluation of swelling and chest pains  If worsening follow up in emergency room  Omeprazole daily for 2 week, may supplement with maalox  I refilled zanaflex and ibuprofen Physical therapy should call you to set up appointment    ED Prescriptions    Medication Sig Dispense Auth. Provider   tiZANidine (ZANAFLEX) 4 MG tablet Take 1 tablet (4 mg total) by mouth every 8 (eight) hours as needed for muscle spasms. 30 tablet ,  C, PA-C   ibuprofen (ADVIL) 600 MG tablet Take 1 tablet (600 mg total) by mouth every 6 (six) hours as needed. 30 tablet , [place] C, PA-C     Controlled Substance Prescriptions [place] Controlled Substance Registry consulted? Not Applicable   Janith Lima, [place] 03/22/19 1117

## 2019-03-22 NOTE — ED Triage Notes (Signed)
Pt presents with centralized chest pain that radiates to his back that is stabbing in nature that is intermittent. Pt is pain free at this time. Pt states that pain is worse when laying on his right side and back. Pt also reports nausea.

## 2019-03-22 NOTE — Discharge Instructions (Addendum)
EKG normal Chest xray without obvious cause of symptoms- did show some scarring in right upper lung  Please follow up with primary, pulmonology, or cardiology for further evaluation of swelling and chest pains  If worsening follow up in emergency room  Omeprazole daily for 2 week, may supplement with maalox  I refilled zanaflex and ibuprofen Physical therapy should call you to set up appointment

## 2019-03-28 ENCOUNTER — Other Ambulatory Visit: Payer: Self-pay

## 2019-03-28 ENCOUNTER — Emergency Department (HOSPITAL_COMMUNITY): Payer: Medicare Other

## 2019-03-28 ENCOUNTER — Emergency Department (HOSPITAL_COMMUNITY)
Admission: EM | Admit: 2019-03-28 | Discharge: 2019-03-28 | Discharge status: Against Medical Advice | Payer: Medicare Other | Attending: Emergency Medicine | Admitting: Emergency Medicine

## 2019-03-28 ENCOUNTER — Encounter (HOSPITAL_COMMUNITY): Payer: Self-pay | Admitting: Emergency Medicine

## 2019-03-28 DIAGNOSIS — F17210 Nicotine dependence, cigarettes, uncomplicated: Secondary | ICD-10-CM | POA: Diagnosis not present

## 2019-03-28 DIAGNOSIS — R0789 Other chest pain: Secondary | ICD-10-CM | POA: Diagnosis not present

## 2019-03-28 DIAGNOSIS — R221 Localized swelling, mass and lump, neck: Secondary | ICD-10-CM | POA: Insufficient documentation

## 2019-03-28 DIAGNOSIS — M542 Cervicalgia: Secondary | ICD-10-CM

## 2019-03-28 LAB — BASIC METABOLIC PANEL
Anion gap: 11 (ref 5–15)
BUN: 7 mg/dL (ref 6–20)
CO2: 25 mmol/L (ref 22–32)
Calcium: 10.1 mg/dL (ref 8.9–10.3)
Chloride: 102 mmol/L (ref 98–111)
Creatinine, Ser: 1.06 mg/dL (ref 0.61–1.24)
GFR calc Af Amer: >60 mL/min (ref >60)
GFR calc non Af Amer: >60 mL/min (ref >60)
Glucose, Bld: 93 mg/dL (ref 70–99)
Potassium: 3.9 mmol/L (ref 3.5–5.1)
Sodium: 138 mmol/L (ref 135–145)

## 2019-03-28 LAB — CBC WITH DIFFERENTIAL/PLATELET
Abs Immature Granulocytes: 0.04 10*3/uL (ref 0.00–0.07)
Basophils Absolute: 0.1 10*3/uL (ref 0.0–0.1)
Basophils Relative: 1 %
Eosinophils Absolute: 0.1 10*3/uL (ref 0.0–0.5)
Eosinophils Relative: 1 %
HCT: 52.2 % — ABNORMAL HIGH (ref 39.0–52.0)
Hemoglobin: 17.5 g/dL — ABNORMAL HIGH (ref 13.0–17.0)
Immature Granulocytes: 0 %
Lymphocytes Relative: 33 %
Lymphs Abs: 4.3 10*3/uL — ABNORMAL HIGH (ref 0.7–4.0)
MCH: 30.6 pg (ref 26.0–34.0)
MCHC: 33.5 g/dL (ref 30.0–36.0)
MCV: 91.4 fL (ref 80.0–100.0)
Monocytes Absolute: 1 10*3/uL (ref 0.1–1.0)
Monocytes Relative: 8 %
Neutro Abs: 7.6 10*3/uL (ref 1.7–7.7)
Neutrophils Relative %: 57 %
Platelets: 247 10*3/uL (ref 150–400)
RBC: 5.71 MIL/uL (ref 4.22–5.81)
RDW: 13.9 % (ref 11.5–15.5)
WBC: 13.1 10*3/uL — ABNORMAL HIGH (ref 4.0–10.5)
nRBC: 0 % (ref 0.0–0.2)

## 2019-03-28 MED ORDER — NICOTINE 21 MG/24HR TD PT24: 21.0000 mg | MEDICATED_PATCH | Freq: Once | TRANSDERMAL | Status: DC

## 2019-03-28 MED ORDER — IOHEXOL 300 MG/ML  SOLN
100.0000 mL | Freq: Once | INTRAMUSCULAR | Status: AC | PRN
  Administered 2019-03-28: 09:00:00 75 mL via INTRAVENOUS

## 2019-03-28 NOTE — Discharge Instructions (Addendum)
It is important that you return to the emergency department for further work-up of the findings we found on your CT today.  The necrotic nodules are concerning for metastatic cancer.  Please try to quit smoking as soon as possible.

## 2019-03-28 NOTE — ED Provider Notes (Signed)
[facility] EMERGENCY DEPARTMENT Provider Note   CSN: [id number] Arrival date & time: 03/28/19  4854     History   Chief Complaint Chief Complaint  Patient presents with  . Neck Pain    swelling    HPI Justin Randall is a 48 y.o. male with history of chronic low back pain, marijuana use, cigarette smoking who presents with a one-week history of right-sided neck pain and swelling.  Patient denies any injury.  Pain is sometimes worse with movement and lying flat.  He reports he has had some tightness in his chest when he walks since the neck pain started, however it is minor.  He denies any pleuritic chest pain or shortness of breath.  He denies any recent long trips, surgeries, known cancer, new leg pain or swelling.  He denies any associated arm swelling.     HPI  Past Medical History:  Diagnosis Date  . Sciatica   . Ulcer of the stomach and intestine     Patient Active Problem List   Diagnosis Date Noted  . Tobacco dependence 12/05/2017  . Depression 12/05/2017  . 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 No pertinent surgical history.      Home Medications    Prior to Admission medications   Medication Sig Start Date End Date Taking? Authorizing Provider  ibuprofen (ADVIL) 600 MG tablet Take 1 tablet (600 mg total) by mouth every 6 (six) hours as needed. 03/22/19   Wieters, Hallie C, PA-C  tiZANidine (ZANAFLEX) 4 MG tablet Take 1 tablet (4 mg total) by mouth every 8 (eight) hours as needed for muscle spasms. 03/22/19   Wieters, Hallie C, PA-C  DULoxetine (CYMBALTA) 30 MG capsule Take 1 capsule (30 mg total) by mouth daily. 12/05/17 03/22/19  [name], MD    Family History Family History  Problem Relation Age of Onset  . Healthy Mother     Social History Social History   Tobacco Use  . Smoking status: Current Every Day Smoker    Packs/day: 1.00  . Smokeless  tobacco: Never Used  Substance Use Topics  . Alcohol use: No  . Drug use: Yes    Types: Marijuana     Allergies   Patient has no known allergies.   Review of Systems Review of Systems  Constitutional: Negative for chills and fever.  HENT: Negative for facial swelling and sore throat.   Respiratory: Negative for shortness of breath.   Cardiovascular: Negative for chest pain.  Gastrointestinal: Negative for abdominal pain, nausea and vomiting.  Genitourinary: Negative for dysuria.  Musculoskeletal: Positive for neck pain. Negative for back pain.  Skin: Negative for rash and wound.  Neurological: Negative for headaches.  Psychiatric/Behavioral: The patient is not nervous/anxious.      Physical Exam Updated Vital Signs BP 129/78 (BP Location: Right Arm)   Pulse 96   Temp 98.9 F (37.2 C) (Oral)   Resp 14   SpO2 98%   Physical Exam Vitals signs and nursing note reviewed.  Constitutional:      General: He is not in acute distress.    Appearance: He is well-developed. He is not diaphoretic.  HENT:     Head: Normocephalic and atraumatic.     Mouth/Throat:     Pharynx: No oropharyngeal exudate.  Eyes:     General: No scleral icterus.       Right eye: No discharge.  Left eye: No discharge.     Conjunctiva/sclera: Conjunctivae normal.     Pupils: Pupils are equal, round, and reactive to light.  Neck:     Musculoskeletal: Normal range of motion and neck supple.     Thyroid: No thyromegaly.   Cardiovascular:     Rate and Rhythm: Normal rate and regular rhythm.     Heart sounds: Normal heart sounds. No murmur. No friction rub. No gallop.   Pulmonary:     Effort: Pulmonary effort is normal. No respiratory distress.     Breath sounds: Normal breath sounds. No stridor. No wheezing or rales.  Abdominal:     General: Bowel sounds are normal. There is no distension.     Palpations: Abdomen is soft.     Tenderness: There is no abdominal tenderness. There is no  guarding or rebound.  Lymphadenopathy:     Cervical: No cervical adenopathy.  Skin:    General: Skin is warm and dry.     Coloration: Skin is not pale.     Findings: No rash.  Neurological:     Mental Status: He is alert.     Coordination: Coordination normal.      ED Treatments / Results  Labs (all labs ordered are listed, but only abnormal results are displayed) Labs Reviewed  CBC WITH DIFFERENTIAL/PLATELET - Abnormal; Notable for the following components:      Result Value   WBC 13.1 (*)    Hemoglobin 17.5 (*)    HCT 52.2 (*)    Lymphs Abs 4.3 (*)    All other components within normal limits  BASIC METABOLIC PANEL    EKG None  Radiology Ct Soft Tissue Neck W Contrast  Result Date: 03/28/2019 CLINICAL DATA:  Neck pain, initial exam R sided neck pain and swelling; please extend to past clavicle. Pain is present when the patient lies down. EXAM: CT NECK WITH CONTRAST TECHNIQUE: Multidetector CT imaging of the neck was performed using the standard protocol following the bolus administration of intravenous contrast. CONTRAST:  66mL OMNIPAQUE IOHEXOL 300 MG/ML  SOLN COMPARISON:  Two-view chest x-ray 03/22/2019 FINDINGS: Pharynx and larynx: No focal mucosal or submucosal lesions are present. Punctate calcification in the right palatine tonsil consistent with prior infection or inflammation. There is mild prominence of the adenoid tissue without a discrete mass. Epiglottis is normal. Hypopharynx is within normal limits. Vocal cords are midline and symmetric. Salivary glands: The submandibular and parotid glands are within normal limits bilaterally. There is no duct dilation. Thyroid: Normal Lymph nodes: 2 benign submental lymph nodes are present. Benign subcentimeter submandibular nodes are present bilaterally. A centrally necrotic posterior right level 2 lymph node measures 8 mm on image 68 of series 8. Enlarged posterior right level 3 and posterior triangle lymph nodes are present. A  high posterior triangle lymph node on the right measures 15 x 21 x 13 mm on image 94 of series 8. There is infiltration of the sternocleidomastoid muscle. Enhancing mass lesion with mixed density suggesting necrosis measures 34 x 22 x 23 mm. More lateral right supraclavicular lymph node also demonstrates central necrosis, measuring up to 17 mm. A nodal mass at the anterior chest wall at the thoracic inlet measures 27 x 22 x 21 mm. Left supraclavicular lymph nodes are similar in appearance. The largest node is at the thoracic inlet measuring 22 x 18 x 15 mm. A high right paratracheal lymph node measures 15 x 9 mm. A left paratracheal node measures 12 mm. Vascular:  No significant vascular calcifications or stenosis is present. Limited intracranial: Negative. Visualized orbits: The globes and orbits are within normal limits. Mastoids and visualized paranasal sinuses: The paranasal sinuses and mastoid air cells are clear. Skeleton: There is straightening of the normal cervical lordosis. Upper chest: Ill-defined irregular airspace opacity is present at the right lung apex measuring at least 4.4 x 2.4 x 1.7 cm. Paraseptal and centrilobular emphysematous changes are present. IMPRESSION: 1. Multiple enlarged necrotic right greater than left lymph nodes concerning for metastatic disease. Although the differential diagnosis includes infectious etiologies such as TB or cat scratch disease, malignancy is most likely. 2. Heterogeneous mass infiltrating the right sternocleidomastoid muscle measures up to 3.4 cm. 3. No primary lesion in the neck. 4. Right upper lobe mass lesion is partially imaged. This may represent a primary lung tumor. Recommend CT of the chest, abdomen, and pelvis with contrast for further evaluation. 5. Left supraclavicular and thoracic inlet lymph nodes are present, similar to those on the right. 6. Bilateral upper mediastinal adenopathy. 7. Emphysema (ICD10-J43.9). Centrilobular and paraseptal emphysematous  changes are present. These results were called by telephone at the time of interpretation on 03/28/2019 at 10:04 am to Dr. Eliezer Mccoy , who verbally acknowledged these results. Electronically Signed   By: San Morelle M.D.   On: 03/28/2019 10:08    Procedures Procedures (including critical care time)  Medications Ordered in ED Medications  iohexol (OMNIPAQUE) 300 MG/ML solution 100 mL (75 mLs Intravenous Contrast Given 03/28/19 0916)     Initial Impression / Assessment and Plan / ED Course  I have reviewed the triage vital signs and the nursing notes.  Pertinent labs & imaging results that were available during my care of the patient were reviewed by me and considered in my medical decision making (see chart for details).        Patient presenting with right-sided neck pain and swelling for the past week.  He has also had some chest tightness and feeling winded when he walks, however he recovers quickly.  Patient found to have multiple enlarged necrotic right greater than left lymph nodes concerning for metastatic disease; there is a heterogeneous mass infiltrating the right SCM muscle; a right upper lobe mass lesion partially imaged which could represent a primary lung tumor.  Radiologist recommends CT of the chest, abdomen, pelvis for further evaluation of primary malignancy.  He advised other infectious etiologies possible including TB or cat scratch disease, however malignancy is most likely.  I discussed these findings with the patient and advised further imaging and admission, however he would like to go home and talk to his wife this evening and come back in the morning.  Patient advised that he could leave [facility], that he could have worsening symptoms or death if he does not follow through with work-up related to these findings.  He understands and promises to return. I discussed patient case with Dr. Jeanell Sparrow who guided the patient's management and agrees with plan.    Final Clinical Impressions(s) / ED Diagnoses   Final diagnoses:  Neck pain    ED Discharge Orders    None       Frederica Kuster, PA-C 03/28/19 1155    Pattricia Boss, MD 03/28/19 1525

## 2019-03-28 NOTE — ED Triage Notes (Signed)
Pt. Stated, My rt. Side of my neck has been swollen for a week now, Only hurts when I lay down.

## 2019-03-30 ENCOUNTER — Encounter (HOSPITAL_COMMUNITY): Payer: Self-pay | Admitting: Emergency Medicine

## 2019-03-30 ENCOUNTER — Emergency Department (HOSPITAL_COMMUNITY)
Admission: EM | Admit: 2019-03-30 | Discharge: 2019-03-30 | Disposition: A | Discharge status: Home / Self Care | Payer: Medicare Other | Attending: Emergency Medicine | Admitting: Emergency Medicine

## 2019-03-30 ENCOUNTER — Emergency Department (HOSPITAL_COMMUNITY): Payer: Medicare Other

## 2019-03-30 DIAGNOSIS — F17210 Nicotine dependence, cigarettes, uncomplicated: Secondary | ICD-10-CM | POA: Diagnosis not present

## 2019-03-30 DIAGNOSIS — R221 Localized swelling, mass and lump, neck: Secondary | ICD-10-CM | POA: Diagnosis present

## 2019-03-30 DIAGNOSIS — D381 Neoplasm of uncertain behavior of trachea, bronchus and lung: Secondary | ICD-10-CM | POA: Insufficient documentation

## 2019-03-30 DIAGNOSIS — R599 Enlarged lymph nodes, unspecified: Secondary | ICD-10-CM | POA: Diagnosis not present

## 2019-03-30 DIAGNOSIS — R918 Other nonspecific abnormal finding of lung field: Secondary | ICD-10-CM

## 2019-03-30 LAB — CBC WITH DIFFERENTIAL/PLATELET
Abs Immature Granulocytes: 0.05 10*3/uL (ref 0.00–0.07)
Basophils Absolute: 0.1 10*3/uL (ref 0.0–0.1)
Basophils Relative: 0 %
Eosinophils Absolute: 0.1 10*3/uL (ref 0.0–0.5)
Eosinophils Relative: 1 %
HCT: 51.7 % (ref 39.0–52.0)
Hemoglobin: 17.4 g/dL — ABNORMAL HIGH (ref 13.0–17.0)
Immature Granulocytes: 0 %
Lymphocytes Relative: 27 %
Lymphs Abs: 3.3 10*3/uL (ref 0.7–4.0)
MCH: 30.5 pg (ref 26.0–34.0)
MCHC: 33.7 g/dL (ref 30.0–36.0)
MCV: 90.5 fL (ref 80.0–100.0)
Monocytes Absolute: 0.9 10*3/uL (ref 0.1–1.0)
Monocytes Relative: 7 %
Neutro Abs: 8 10*3/uL — ABNORMAL HIGH (ref 1.7–7.7)
Neutrophils Relative %: 65 %
Platelets: 252 10*3/uL (ref 150–400)
RBC: 5.71 MIL/uL (ref 4.22–5.81)
RDW: 14.1 % (ref 11.5–15.5)
WBC: 12.4 10*3/uL — ABNORMAL HIGH (ref 4.0–10.5)
nRBC: 0 % (ref 0.0–0.2)

## 2019-03-30 LAB — BASIC METABOLIC PANEL
Anion gap: 11 (ref 5–15)
BUN: 10 mg/dL (ref 6–20)
CO2: 23 mmol/L (ref 22–32)
Calcium: 10.2 mg/dL (ref 8.9–10.3)
Chloride: 104 mmol/L (ref 98–111)
Creatinine, Ser: 1.05 mg/dL (ref 0.61–1.24)
GFR calc Af Amer: >60 mL/min (ref >60)
GFR calc non Af Amer: >60 mL/min (ref >60)
Glucose, Bld: 78 mg/dL (ref 70–99)
Potassium: 3.9 mmol/L (ref 3.5–5.1)
Sodium: 138 mmol/L (ref 135–145)

## 2019-03-30 MED ORDER — OXYCODONE-ACETAMINOPHEN 5-325 MG PO TABS: 1.0000 | ORAL_TABLET | ORAL | 0 refills | Status: DC | PRN

## 2019-03-30 MED ORDER — HYDROMORPHONE HCL 1 MG/ML IJ SOLN
1.0000 mg | Freq: Once | INTRAMUSCULAR | Status: DC
  Filled 2019-03-30: qty 1

## 2019-03-30 MED ORDER — KETOROLAC TROMETHAMINE 15 MG/ML IJ SOLN
15.0000 mg | Freq: Once | INTRAMUSCULAR | Status: AC
  Administered 2019-03-30: 15 mg via INTRAVENOUS
  Filled 2019-03-30: qty 1

## 2019-03-30 MED ORDER — IOHEXOL 300 MG/ML  SOLN
100.0000 mL | Freq: Once | INTRAMUSCULAR | Status: AC | PRN
  Administered 2019-03-30: 100 mL via INTRAVENOUS

## 2019-03-30 MED ORDER — SODIUM CHLORIDE 0.9 % IV SOLN
INTRAVENOUS | Status: DC
  Administered 2019-03-30: 14:00:00 via INTRAVENOUS

## 2019-03-30 NOTE — ED Notes (Signed)
Blood redraw sent to lab at this time.  This RN called lab to inform them so they can retreive it ASAP

## 2019-03-30 NOTE — ED Triage Notes (Signed)
Pt is here to be re seen regarding a possible malignant tumor that he had evaluated on 8/23 he was advised about the possibly of this being a malignancy and further need for CT scan of chest abd pelvis- at the time learning of this pt was overwhelmed and signed himself out ama but has came back now to receive tests.

## 2019-03-30 NOTE — Discharge Instructions (Addendum)
Unfortunately, your imaging is concerning for cancer.  Confirmation is required for additional possibilities for therapy. If you do not receive a call from our cancer center tomorrow, please call the provided number to arrange appropriate ongoing care.

## 2019-03-30 NOTE — ED Provider Notes (Signed)
[facility] EMERGENCY DEPARTMENT Provider Note   CSN: [id number] Arrival date & time: 03/30/19  4709     History   Chief Complaint Chief Complaint  Patient presents with  . Tumor    HPI Justin Randall is a 48 y.o. male.     HPI   48 year old male with right-sided neck pain.  Seen the emergency room 2 days ago.  A CT of his neck which is abnormal.  Showing necrotic lymph nodes, likely malignancy.  He was advised to obtain additional imaging, but he left.  He states that he needs time to process his estimation is not are returning for imaging.  He has no new complaints since he was last seen.  He still complained of pain in his right neck into his right shoulder.  No fevers. Long hisotry of smoking.   Past Medical History:  Diagnosis Date  . Sciatica   . Ulcer of the stomach and intestine     Patient Active Problem List   Diagnosis Date Noted  . Tobacco dependence 12/05/2017  . Depression 12/05/2017  . 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 No pertinent surgical history.    Home Medications    Prior to Admission medications   Medication Sig Start Date End Date Taking? Authorizing Provider  ibuprofen (ADVIL) 600 MG tablet Take 1 tablet (600 mg total) by mouth every 6 (six) hours as needed. 03/22/19  Yes Wieters, Hallie C, PA-C  tiZANidine (ZANAFLEX) 4 MG tablet Take 1 tablet (4 mg total) by mouth every 8 (eight) hours as needed for muscle spasms. 03/22/19   Wieters, Hallie C, PA-C  DULoxetine (CYMBALTA) 30 MG capsule Take 1 capsule (30 mg total) by mouth daily. 12/05/17 03/22/19  [name], MD    Family History Family History  Problem Relation Age of Onset  . Healthy Mother     Social History Social History   Tobacco Use  . Smoking status: Current Every Day Smoker    Packs/day: 1.00  . Smokeless tobacco: Never Used  Substance Use Topics  . Alcohol use: No   . Drug use: Yes    Types: Marijuana     Allergies   Patient has no known allergies.   Review of Systems Review of Systems  All systems reviewed and negative, other than as noted in HPI.  Physical Exam Updated Vital Signs BP 108/74   Pulse 87   Temp 98.3 F (36.8 C) (Oral)   Resp 18   SpO2 95%   Physical Exam Vitals signs and nursing note reviewed.  Constitutional:      General: He is not in acute distress.    Appearance: He is well-developed.  HENT:     Head: Normocephalic.     Comments: Tender cervical right adenopathy.  No fluctuance. Mild soft tissue swelling. Normal sounding voice.  No stridor. Eyes:     General:        Right eye: No discharge.        Left eye: No discharge.     Conjunctiva/sclera: Conjunctivae normal.  Neck:     Musculoskeletal: Neck supple.  Cardiovascular:     Rate and Rhythm: Normal rate and regular rhythm.     Heart sounds: Normal heart sounds. No murmur. No friction rub. No gallop.   Pulmonary:     Effort: Pulmonary effort is normal. No respiratory distress.     Breath sounds: Normal breath  sounds.  Abdominal:     General: There is no distension.     Palpations: Abdomen is soft.     Tenderness: There is no abdominal tenderness.  Musculoskeletal:        General: No tenderness.  Skin:    General: Skin is warm and dry.  Neurological:     Mental Status: He is alert.  Psychiatric:        Behavior: Behavior normal.        Thought Content: Thought content normal.      ED Treatments / Results  Labs (all labs ordered are listed, but only abnormal results are displayed) Labs Reviewed  CBC WITH DIFFERENTIAL/PLATELET - Abnormal; Notable for the following components:      Result Value   WBC 12.4 (*)    Hemoglobin 17.4 (*)    Neutro Abs 8.0 (*)    All other components within normal limits  BASIC METABOLIC PANEL    EKG None  Radiology No results found.  Procedures Procedures (including critical care time)  Medications  Ordered in ED Medications  0.9 %  sodium chloride infusion ( Intravenous New Bag/Given 03/30/19 1420)  HYDROmorphone (DILAUDID) injection 1 mg (0 mg Intravenous Hold 03/30/19 1423)  ketorolac (TORADOL) 15 MG/ML injection 15 mg (15 mg Intravenous Given 03/30/19 1421)     Initial Impression / Assessment and Plan / ED Course  I have reviewed the triage vital signs and the nursing notes.  Pertinent labs & imaging results that were available during my care of the patient were reviewed by me and considered in my medical decision making (see chart for details).  48 year old male with persistent and progressive right-sided neck/shoulder pain.  Recent evaluation in the emergency room.  Abnormal CT neck.  Additional imaging was recommended but patient left prior to this.  Return today to obtain that imaging.  Reports no new additional complaints since last seen.  Unfortunately, suspect that this is malignancy.  I am not concerned about his airway at this time.  Will obtain CT of the abdomen and pelvis as well as the chest as recommended by radiology.  Discussed with patient that follow-up will likely be with oncology.  He is having quite a bit of pain.  I feel that prescription for pain medicine until he could follow-up is reasonable.  Final Clinical Impressions(s) / ED Diagnoses   Final diagnoses:  Neck mass  Enlarged lymph nodes    ED Discharge Orders    None       Virgel Manifold, MD 03/30/19 [phone]

## 2019-03-30 NOTE — ED Provider Notes (Signed)
Following completion of the patient's radiographic studies I discussed the findings with him and his wife. Subsequently discussed the patient's case with our oncology team, and his information has been provided to their clinical care coordinator for thoracic lesions.  The patient should receive a call tomorrow for facilitation of likely biopsy in the coming days, and ongoing oncologic evaluation.   Carmin Muskrat, MD 03/30/19 1730

## 2019-03-30 NOTE — ED Notes (Signed)
Patient verbalizes understanding of discharge instructions. Opportunity for questioning and answers were provided. Armband removed by staff, pt discharged from ED.  

## 2019-03-30 NOTE — ED Notes (Signed)
Called CT to reports labs are back and to make sure they are coming for pt.

## 2019-03-30 NOTE — ED Notes (Signed)
Patient transported to CT 

## 2019-03-31 ENCOUNTER — Telehealth: Payer: Self-pay

## 2019-03-31 ENCOUNTER — Telehealth: Payer: Self-pay | Admitting: *Deleted

## 2019-03-31 DIAGNOSIS — R911 Solitary pulmonary nodule: Secondary | ICD-10-CM

## 2019-03-31 DIAGNOSIS — R918 Other nonspecific abnormal finding of lung field: Secondary | ICD-10-CM

## 2019-03-31 NOTE — Telephone Encounter (Signed)
Error

## 2019-03-31 NOTE — Addendum Note (Signed)
Addended by: Vivia Ewing on: 03/31/2019 01:47 PM   Modules accepted: Orders

## 2019-03-31 NOTE — Telephone Encounter (Signed)
PCCM:  I called and spoke with the patient via phone and explained the need for referral urgently to our office based on the CT image findings. A pre-op appt has been scheduled and we will get him scheduled for a procedure and tissue diagnosis.   Orders placed  Garner Nash, DO [facility] 03/31/2019 6:52 PM

## 2019-03-31 NOTE — Telephone Encounter (Signed)
Call made to patient, offered appt today, patient declined. Appt made for 04/07/19. Voiced understanding. Nothing further needed at this time.

## 2019-03-31 NOTE — Telephone Encounter (Signed)
-----   Message from Garner Nash, DO sent at 03/31/2019  1:05 PM EDT ----- Regarding: call for appt/needs [place],   Can you call this guy and get him on my schedule. He needs a bronchoscopy. Maybe set up for appt next Wednesday morning for the appt with me and planned bronchoscopy EBUS + ENB + Radial bronchoscopy on Friday afternoon to follow my first case.   Or if he answers? I work him in today?  Thanks  Leory Plowman

## 2019-03-31 NOTE — Telephone Encounter (Signed)
Oncology Nurse Navigator Documentation  Oncology Nurse Navigator Flowsheets 03/31/2019  Navigator Location [facility]  Referral Date to RadOnc/MedOnc 03/31/2019  Navigator Encounter Type Telephone/I received a message from Dr. Lindi Adie regarding referral on Justin Randall.  I updated Dr. Julien Nordmann and per his request made referral to pulmonary.  I also updated Dr. Valeta Harms and his nurse on referral.  I called Justin Randall and updated that he will get a call from the pulmonary team for an appt to discuss next steps with more imagining and bx.  He verbalized understanding.   Telephone Outgoing Call  Treatment Phase Abnormal Scans  Barriers/Navigation Needs Coordination of Care;Education  Education Other  Interventions Coordination of Care;Education  Coordination of Care Other  Education Method Verbal  Acuity Level 2  Time Spent with Patient 57

## 2019-04-05 ENCOUNTER — Ambulatory Visit (HOSPITAL_COMMUNITY)
Admission: EM | Admit: 2019-04-05 | Discharge: 2019-04-05 | Disposition: A | Discharge status: Home / Self Care | Payer: Medicare Other | Attending: Family Medicine | Admitting: Family Medicine

## 2019-04-05 ENCOUNTER — Other Ambulatory Visit: Payer: Self-pay

## 2019-04-05 ENCOUNTER — Encounter (HOSPITAL_COMMUNITY): Payer: Self-pay

## 2019-04-05 DIAGNOSIS — Z760 Encounter for issue of repeat prescription: Secondary | ICD-10-CM | POA: Diagnosis not present

## 2019-04-05 DIAGNOSIS — G8929 Other chronic pain: Secondary | ICD-10-CM

## 2019-04-05 HISTORY — DX: Malignant (primary) neoplasm, unspecified: C80.1

## 2019-04-05 MED ORDER — OXYCODONE-ACETAMINOPHEN 5-325 MG PO TABS: 1.0000 | ORAL_TABLET | ORAL | 0 refills | Status: DC | PRN

## 2019-04-05 NOTE — Discharge Instructions (Signed)
Refilling your pain meds Follow up with your doctors this week as planned.

## 2019-04-05 NOTE — ED Provider Notes (Signed)
[facility]    CSN: [id number] Arrival date & time: 04/05/19  0805      History   Chief Complaint Chief Complaint  Patient presents with  . Medication Refill    HPI Justin Randall is a 48 y.o. male.   Patient is a 48 year old male with past medical history and recent diagnosis of neck mass and lung mass.  He is scheduled for multiple appointments this week to include biopsy and meet with oncology.  He was given a prescription for oxycodone in the ER on the 25th and is down to his last pill.  He has only been taking this medication once or twice a day as needed.  Reporting that it helps with his pain.  The pain is to the right side of his chest and neck area.  Denies any shortness of breath, fever.  ROS per HPI      Past Medical History:  Diagnosis Date  . Cancer ([place])   . Sciatica   . Ulcer of the stomach and intestine     Patient Active Problem List   Diagnosis Date Noted  . Tobacco dependence 12/05/2017  . Depression 12/05/2017  . 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 No pertinent surgical history.     Home Medications    Prior to Admission medications   Medication Sig Start Date End Date Taking? Authorizing Provider  ibuprofen (ADVIL) 600 MG tablet Take 1 tablet (600 mg total) by mouth every 6 (six) hours as needed. 03/22/19   Wieters, Hallie C, PA-C  oxyCODONE-acetaminophen (PERCOCET/ROXICET) 5-325 MG tablet Take 1 tablet by mouth every 4 (four) hours as needed for severe pain. 04/05/19   [name], NP  tiZANidine (ZANAFLEX) 4 MG tablet Take 1 tablet (4 mg total) by mouth every 8 (eight) hours as needed for muscle spasms. 03/22/19   Wieters, Hallie C, PA-C  DULoxetine (CYMBALTA) 30 MG capsule Take 1 capsule (30 mg total) by mouth daily. 12/05/17 03/22/19  [name], MD    Family History Family History  Problem Relation Age of Onset  . Healthy Mother      Social History Social History   Tobacco Use  . Smoking status: Current Every Day Smoker    Packs/day: 1.00  . Smokeless tobacco: Never Used  Substance Use Topics  . Alcohol use: No  . Drug use: Yes    Types: Marijuana     Allergies   Patient has no known allergies.   Review of Systems Review of Systems   Physical Exam Triage Vital Signs ED Triage Vitals  Enc Vitals Group     BP 04/05/19 0827 (!) 141/83     Pulse --      Resp 04/05/19 0827 18     Temp 04/05/19 0827 98.6 F (37 C)     Temp Source 04/05/19 0827 Oral     SpO2 04/05/19 0827 100 %     Weight 04/05/19 0829 190 lb (86.2 kg)     Height --      Head Circumference --      Peak Flow --      Pain Score 04/05/19 0829 5     Pain Loc --      Pain Edu? --      Excl. in [place]? --    No data found.  Updated Vital Signs BP (!) 141/83 (BP Location: Right Arm)   Temp 98.6 F (  37 C) (Oral)   Resp 18   Wt 190 lb (86.2 kg)   SpO2 100%   BMI 24.39 kg/m   Visual Acuity Right Eye Distance:   Left Eye Distance:   Bilateral Distance:    Right Eye Near:   Left Eye Near:    Bilateral Near:     Physical Exam Vitals signs and nursing note reviewed.  Constitutional:      Appearance: Normal appearance.  HENT:     Head: Normocephalic and atraumatic.     Nose: Nose normal.  Eyes:     Conjunctiva/sclera: Conjunctivae normal.  Neck:     Musculoskeletal: Normal range of motion.  Pulmonary:     Effort: Pulmonary effort is normal.  Musculoskeletal: Normal range of motion.  Skin:    General: Skin is warm and dry.  Neurological:     Mental Status: He is alert.  Psychiatric:        Mood and Affect: Mood normal.      UC Treatments / Results  Labs (all labs ordered are listed, but only abnormal results are displayed) Labs Reviewed - No data to display  EKG   Radiology No results found.  Procedures Procedures (including critical care time)  Medications Ordered in UC Medications - No data to display   Initial Impression / Assessment and Plan / UC Course  I have reviewed the triage vital signs and the nursing notes.  Pertinent labs & imaging results that were available during my care of the patient were reviewed by me and considered in my medical decision making (see chart for details).     I feel it is reasonable to go ahead and refill patient's pain medication based on patient's current situation. Patient understanding that he will need to get further refills from his primary care or oncologist. Final Clinical Impressions(s) / UC Diagnoses   Final diagnoses:  Medication refill     Discharge Instructions     Refilling your pain meds Follow up with your doctors this week as planned.     ED Prescriptions    Medication Sig Dispense Auth. Provider   oxyCODONE-acetaminophen (PERCOCET/ROXICET) 5-325 MG tablet Take 1 tablet by mouth every 4 (four) hours as needed for severe pain. 20 tablet [name], NP     Controlled Substance Prescriptions [place] Controlled Substance Registry consulted? Not Applicable   Orvan July, NP 04/05/19 [phone]

## 2019-04-05 NOTE — Pre-Procedure Instructions (Signed)
[facility], [place] [place] [address] Phone: [phone] Fax: [place], [address] [place] [address] Phone: [phone] Fax: [phone]      Your procedure is scheduled on Friday, September 4th.  Report to [facility] Entrance "A" at 1:15 P.M., and check in at the Admitting office.  Call this number if you have problems the morning of surgery:  [phone]  Call [phone] if you have any questions prior to your surgery date Monday-Friday 8am-4pm    Remember:  Do not eat or drink after midnight the night before your surgery    Take these medicines the morning of surgery with A SIP OF WATER  oxyCODONE-acetaminophen (PERCOCET/ROXICET)-as needed for pain tiZANidine (ZANAFLEX)-as needed for muscle spasms  7 days prior to surgery STOP taking any Aspirin (unless otherwise instructed by your surgeon), Aleve, Naproxen, Ibuprofen, Motrin, Advil, Goody's, BC's, all herbal medications, fish oil, and all vitamins.    The Morning of Surgery  Do not wear jewelry.  Do not wear lotions, powders, or colognes, or deodorant  Men may shave face and neck.  Do not bring valuables to the hospital.  [facility] is not responsible for any belongings or valuables.  If you are a smoker, DO NOT Smoke 24 hours prior to surgery IF you wear a CPAP at night please bring your mask, tubing, and machine the morning of surgery   Remember that you must have someone to transport you home after your surgery, and remain with you for 24 hours if you are discharged the same day.   Contacts, glasses, hearing aids, dentures or bridgework may not be worn into surgery.    Leave your suitcase in the car.  After surgery it may be brought to your room.  For patients admitted to the hospital, discharge time will  be determined by your treatment team.  Patients discharged the day of surgery will not be allowed to drive home.    Special instructions:   - Preparing For Surgery  Before surgery, you can play an important role. Because skin is not sterile, your skin needs to be as free of germs as possible. You can reduce the number of germs on your skin by washing with CHG (chlorahexidine gluconate) Soap before surgery.  CHG is an antiseptic cleaner which kills germs and bonds with the skin to continue killing germs even after washing.    Oral Hygiene is also important to reduce your risk of infection.  Remember - BRUSH YOUR TEETH THE MORNING OF SURGERY WITH YOUR REGULAR TOOTHPASTE  Please do not use if you have an allergy to CHG or antibacterial soaps. If your skin becomes reddened/irritated stop using the CHG.  Do not shave (including legs and underarms) for at least 48 hours prior to first CHG shower. It is OK to shave your face.  Please follow these instructions carefully.   1. Shower the NIGHT BEFORE SURGERY and the MORNING OF SURGERY with CHG Soap.   2. If you chose to wash your hair, wash your hair first as usual with your normal shampoo.  3. After you shampoo, rinse your hair and body thoroughly to remove the shampoo.  4. Use CHG as you would any other liquid soap. You can apply CHG directly to the skin and wash gently with a  scrungie or a clean washcloth.   5. Apply the CHG Soap to your body ONLY FROM THE NECK DOWN.  Do not use on open wounds or open sores. Avoid contact with your eyes, ears, mouth and genitals (private parts). Wash Face and genitals (private parts)  with your normal soap.   6. Wash thoroughly, paying special attention to the area where your surgery will be performed.  7. Thoroughly rinse your body with warm water from the neck down.  8. DO NOT shower/wash with your normal soap after using and rinsing off the CHG Soap.  9. Pat yourself dry with a CLEAN  TOWEL.  10. Wear CLEAN PAJAMAS to bed the night before surgery, wear comfortable clothes the morning of surgery  11. Place CLEAN SHEETS on your bed the night of your first shower and DO NOT SLEEP WITH PETS.    Day of Surgery:  Do not apply any deodorants/lotions. Please shower the morning of surgery with the CHG soap  Please wear clean clothes to the hospital/surgery center.   Remember to brush your teeth WITH YOUR REGULAR TOOTHPASTE.   Please read over the following fact sheets that you were given.

## 2019-04-05 NOTE — ED Triage Notes (Addendum)
Pt states he has Cancer. Pt states he needs a refill on his hydrocodone.

## 2019-04-06 ENCOUNTER — Encounter (HOSPITAL_COMMUNITY): Payer: Self-pay

## 2019-04-06 ENCOUNTER — Encounter (HOSPITAL_COMMUNITY)
Admission: RE | Admit: 2019-04-06 | Discharge: 2019-04-06 | Disposition: A | Discharge status: Home / Self Care | Payer: Medicare Other | Source: Ambulatory Visit | Attending: Pulmonary Disease | Admitting: Pulmonary Disease

## 2019-04-06 ENCOUNTER — Other Ambulatory Visit (HOSPITAL_COMMUNITY)
Admission: RE | Admit: 2019-04-06 | Discharge: 2019-04-06 | Disposition: A | Discharge status: Home / Self Care | Payer: Medicare Other | Source: Ambulatory Visit | Attending: Pulmonary Disease | Admitting: Pulmonary Disease

## 2019-04-06 DIAGNOSIS — Z20828 Contact with and (suspected) exposure to other viral communicable diseases: Secondary | ICD-10-CM | POA: Diagnosis not present

## 2019-04-06 DIAGNOSIS — R590 Localized enlarged lymph nodes: Secondary | ICD-10-CM | POA: Diagnosis present

## 2019-04-06 DIAGNOSIS — C770 Secondary and unspecified malignant neoplasm of lymph nodes of head, face and neck: Secondary | ICD-10-CM | POA: Diagnosis not present

## 2019-04-06 HISTORY — DX: Anxiety disorder, unspecified: F41.9

## 2019-04-06 LAB — COMPREHENSIVE METABOLIC PANEL
ALT: 9 U/L (ref 0–44)
AST: 12 U/L — ABNORMAL LOW (ref 15–41)
Albumin: 3.7 g/dL (ref 3.5–5.0)
Alkaline Phosphatase: 87 U/L (ref 38–126)
Anion gap: 12 (ref 5–15)
BUN: 8 mg/dL (ref 6–20)
CO2: 24 mmol/L (ref 22–32)
Calcium: 9.8 mg/dL (ref 8.9–10.3)
Chloride: 101 mmol/L (ref 98–111)
Creatinine, Ser: 1.07 mg/dL (ref 0.61–1.24)
GFR calc Af Amer: >60 mL/min (ref >60)
GFR calc non Af Amer: >60 mL/min (ref >60)
Glucose, Bld: 104 mg/dL — ABNORMAL HIGH (ref 70–99)
Potassium: 3.6 mmol/L (ref 3.5–5.1)
Sodium: 137 mmol/L (ref 135–145)
Total Bilirubin: 0.4 mg/dL (ref 0.3–1.2)
Total Protein: 7.4 g/dL (ref 6.5–8.1)

## 2019-04-06 LAB — CBC
HCT: 49.1 % (ref 39.0–52.0)
Hemoglobin: 16.3 g/dL (ref 13.0–17.0)
MCH: 30.6 pg (ref 26.0–34.0)
MCHC: 33.2 g/dL (ref 30.0–36.0)
MCV: 92.1 fL (ref 80.0–100.0)
Platelets: 261 10*3/uL (ref 150–400)
RBC: 5.33 MIL/uL (ref 4.22–5.81)
RDW: 13.8 % (ref 11.5–15.5)
WBC: 13.8 10*3/uL — ABNORMAL HIGH (ref 4.0–10.5)
nRBC: 0 % (ref 0.0–0.2)

## 2019-04-06 LAB — PROTIME-INR
INR: 1 (ref 0.8–1.2)
Prothrombin Time: 13.5 seconds (ref 11.4–15.2)

## 2019-04-06 LAB — APTT: aPTT: 27 seconds (ref 24–36)

## 2019-04-07 ENCOUNTER — Other Ambulatory Visit: Payer: Self-pay | Admitting: Radiology

## 2019-04-07 ENCOUNTER — Encounter: Payer: Self-pay | Admitting: Pulmonary Disease

## 2019-04-07 ENCOUNTER — Ambulatory Visit (INDEPENDENT_AMBULATORY_CARE_PROVIDER_SITE_OTHER): Payer: Medicare Other | Admitting: Pulmonary Disease

## 2019-04-07 ENCOUNTER — Other Ambulatory Visit: Payer: Self-pay

## 2019-04-07 VITALS — BP 140/100 | HR 93 | Temp 97.2°F | Ht 74.0 in | Wt 175.4 lb

## 2019-04-07 DIAGNOSIS — R918 Other nonspecific abnormal finding of lung field: Secondary | ICD-10-CM

## 2019-04-07 DIAGNOSIS — R590 Localized enlarged lymph nodes: Secondary | ICD-10-CM | POA: Diagnosis not present

## 2019-04-07 LAB — SARS CORONAVIRUS 2 (TAT 6-24 HRS): SARS Coronavirus 2: NEGATIVE

## 2019-04-07 MED FILL — IBUPROFEN 600 MG TABLET: 600 | 7 days supply | Qty: 30 | Fill #0

## 2019-04-07 MED FILL — tiZANidine HCL 4 MG TABS: 4 | 10 days supply | Qty: 30 | Fill #0

## 2019-04-07 NOTE — Progress Notes (Signed)
Synopsis: Referred in September 2020 for abnormal CT chest by [name], MD  Subjective:   PATIENT ID: Justin Randall GENDER: male DOB: [birth year], MRN: [id number]  Chief Complaint  Patient presents with   Consult    Appt to discuss abnormal CT.     Patient with a recent emergency room admission for pain in his right neck.  Patient was found to have necrotic lymph node on imaging.  Subsequent chest imaging revealed lung mass.  Patient was referred to oncology.  Patient was then referred to our practice for bronchoscopy evaluation and tissue diagnosis of lung mass and mediastinal adenopathy.  Patient here today for a consultation for bronchoscopy.  Tentative bronchoscopy has been scheduled for this Friday afternoon.  Today, patient doing well today anxious about his recent diagnosis.  Today in the office we reviewed his CT imaging.  He has been a longtime smoker.  Currently on disability for back pain.  He lives at home with his wife, children and 3 grandchildren.  He has felt more fatigued recently.  Denies night sweats.  May have lost some weight.  But he has not been tracking this.   Past Medical History:  Diagnosis Date   Anxiety    Cancer ([place])    Sciatica    Ulcer of the stomach and intestine      Family History  Problem Relation Age of Onset   Healthy Mother      Past Surgical History:  Procedure Laterality Date   NO PAST SURGERIES      Social History   Socioeconomic History   Marital status: Married    Spouse name: Not on file   Number of children: Not on file   Years of education: Not on file   Highest education level: Not on file  Occupational History   Not on file  Social Needs   Financial resource strain: Not on file   Food insecurity    Worry: Not on file    Inability: Not on file   Transportation needs    Medical: Not on file    Non-medical: Not on file  Tobacco Use   Smoking status: Current Every Day Smoker    Packs/day:  1.00    Years: 30.00    Pack years: 30.00   Smokeless tobacco: Never Used  Substance and Sexual Activity   Alcohol use: No   Drug use: Yes    Types: Marijuana   Sexual activity: Not on file  Lifestyle   Physical activity    Days per week: Not on file    Minutes per session: Not on file   Stress: Not on file  Relationships   Social connections    Talks on phone: Not on file    Gets together: Not on file    Attends religious service: Not on file    Active member of club or organization: Not on file    Attends meetings of clubs or organizations: Not on file    Relationship status: Not on file   Intimate partner violence    Fear of current or ex partner: Not on file    Emotionally abused: Not on file    Physically abused: Not on file    Forced sexual activity: Not on file  Other Topics Concern   Not on file  Social History Narrative   Not on file     No Known Allergies   Outpatient Medications Prior to Visit  Medication Sig Dispense Refill  ibuprofen (ADVIL) 600 MG tablet Take 1 tablet (600 mg total) by mouth every 6 (six) hours as needed. 30 tablet 0   oxyCODONE-acetaminophen (PERCOCET/ROXICET) 5-325 MG tablet Take 1 tablet by mouth every 4 (four) hours as needed for severe pain. 20 tablet 0   tiZANidine (ZANAFLEX) 4 MG tablet Take 1 tablet (4 mg total) by mouth every 8 (eight) hours as needed for muscle spasms. 30 tablet 0   No facility-administered medications prior to visit.     Review of Systems  Constitutional: Positive for malaise/fatigue and weight loss. Negative for chills and fever.  HENT: Negative for hearing loss, sore throat and tinnitus.   Eyes: Negative for blurred vision and double vision.  Respiratory: Positive for cough and shortness of breath. Negative for hemoptysis, sputum production, wheezing and stridor.   Cardiovascular: Negative for chest pain, palpitations, orthopnea, leg swelling and PND.  Gastrointestinal: Negative for abdominal  pain, constipation, diarrhea, heartburn, nausea and vomiting.  Genitourinary: Negative for dysuria, hematuria and urgency.  Musculoskeletal: Negative for joint pain and myalgias.  Skin: Negative for itching and rash.  Neurological: Negative for dizziness, tingling, weakness and headaches.  Endo/Heme/Allergies: Negative for environmental allergies. Does not bruise/bleed easily.  Psychiatric/Behavioral: Negative for depression. The patient is not nervous/anxious and does not have insomnia.   All other systems reviewed and are negative.    Objective:  Physical Exam Vitals signs reviewed.  Constitutional:      General: He is not in acute distress.    Appearance: He is well-developed.  HENT:     Head: Normocephalic and atraumatic.  Eyes:     General: No scleral icterus.    Conjunctiva/sclera: Conjunctivae normal.     Pupils: Pupils are equal, round, and reactive to light.  Neck:     Musculoskeletal: Neck supple.     Vascular: No JVD.     Trachea: No tracheal deviation.     Comments: Large palpable adenopathy within the right supraclavicular space. Cardiovascular:     Rate and Rhythm: Normal rate and regular rhythm.     Heart sounds: Normal heart sounds. No murmur.  Pulmonary:     Effort: Pulmonary effort is normal. No tachypnea, accessory muscle usage or respiratory distress.     Breath sounds: No stridor. No wheezing, rhonchi or rales.     Comments: Diminished breath sounds bilaterally Abdominal:     General: Bowel sounds are normal. There is no distension.     Palpations: Abdomen is soft.     Tenderness: There is no abdominal tenderness.  Musculoskeletal:        General: No tenderness.  Lymphadenopathy:     Cervical: Cervical adenopathy present.  Skin:    General: Skin is warm and dry.     Capillary Refill: Capillary refill takes less than 2 seconds.     Findings: No rash.  Neurological:     Mental Status: He is alert and oriented to person, place, and time.    Psychiatric:        Behavior: Behavior normal.      Vitals:   04/07/19 1059  BP: (!) 140/100  Pulse: 93  Temp: (!) 97.2 F (36.2 C)  TempSrc: Temporal  SpO2: 100%  Weight: 175 lb 6.4 oz (79.6 kg)  Height: 6\' 2"  (1.88 m)   100% on RA BMI Readings from Last 3 Encounters:  04/07/19 22.52 kg/m  04/06/19 22.39 kg/m  04/05/19 24.39 kg/m   Wt Readings from Last 3 Encounters:  04/07/19 175 lb 6.4 oz (  79.6 kg)  04/06/19 174 lb 6.4 oz (79.1 kg)  04/05/19 190 lb (86.2 kg)    CBC    Component Value Date/Time   WBC 13.8 (H) 04/06/2019 1109   RBC 5.33 04/06/2019 1109   HGB 16.3 04/06/2019 1109   HCT 49.1 04/06/2019 1109   PLT 261 04/06/2019 1109   MCV 92.1 04/06/2019 1109   MCH 30.6 04/06/2019 1109   MCHC 33.2 04/06/2019 1109   RDW 13.8 04/06/2019 1109   LYMPHSABS 3.3 03/30/2019 1320   MONOABS 0.9 03/30/2019 1320   EOSABS 0.1 03/30/2019 1320   BASOSABS 0.1 03/30/2019 1320    Chest Imaging:  03/30/2019 CT chest: Necrotic nodal mass within the anterior mediastinum, right paratracheal lymph node.  Right apical centrally cavitating mass 4.3 x 3.1.  Pulmonary Functions Testing Results: No flowsheet data found.  FeNO: None   Pathology: None   Echocardiogram: None   Heart Catheterization: None     Assessment & Plan:     ICD-10-CM   1. Mass of upper lobe of right lung  R91.8   2. Mediastinal adenopathy  R59.0   3. Supraclavicular adenopathy  R59.0     Discussion:  This is a young 48 year old gentleman with abnormal CT chest imaging concerning for an advanced stage bronchogenic carcinoma.  He has a very large matted collection of right supraclavicular adenopathy palpable today in the office and seen on CT soft tissue neck.  Decision was made after discussing risk benefits and alternatives of proceeding with bronchoscopy versus percutaneous needle approach to the right neck.  Decision was made for cancellation of bronchoscopy and proceeding with needle  biopsy.  I called and spoke with Dr. Laurence Ferrari from interventional radiology who is agreed to place the patient for add-on schedule of ultrasound-guided biopsy tomorrow morning at 9 AM.  We are very appreciative of their willingness to add patient on and to get them in so early.  The instructions were given to the patient in the office today about scheduling of the biopsy.  We also gave him the telephone number to call in case he does not hear from them.  Dr. Laurence Ferrari also informed us that the scheduling office will be calling to give him information regarding the procedure as well.  Patient to return to our clinic in approximately 6 weeks after establishment of tissue diagnosis.  We will also need referral to medical oncology following tissue diagnosis.  Greater than 50% of this patient's 60-minute of visit was spent face-to-face discussing above recommendations and treatment plan.  We reviewed the risk benefits and alternatives of proceeding with bronchoscopy versus percutaneous needle biopsy.  I believe the safest approach is a percutaneous needle biopsy to the right neck for tissue diagnosis.  This has been scheduled for tomorrow after discussing care with Dr. Laurence Ferrari from interventional radiology.    Current Outpatient Medications:    ibuprofen (ADVIL) 600 MG tablet, Take 1 tablet (600 mg total) by mouth every 6 (six) hours as needed., Disp: 30 tablet, Rfl: 0   oxyCODONE-acetaminophen (PERCOCET/ROXICET) 5-325 MG tablet, Take 1 tablet by mouth every 4 (four) hours as needed for severe pain., Disp: 20 tablet, Rfl: 0   tiZANidine (ZANAFLEX) 4 MG tablet, Take 1 tablet (4 mg total) by mouth every 8 (eight) hours as needed for muscle spasms., Disp: 30 tablet, Rfl: 0   Garner Nash, DO [facility] 04/07/2019 11:30 AM

## 2019-04-07 NOTE — Patient Instructions (Addendum)
Thank you for visiting Dr. Valeta Harms at [facility]. Today we recommend the following:  Orders Placed This Encounter  Procedures  . IR US SOFT TISSUE NECK   Return in about 6 weeks (around 05/19/2019).  Phone: [phone] to call for questions about scheduling the procedure TOMORROW.  Scheduled for 9AM at [facility].    Please do your part to reduce the spread of COVID-19.

## 2019-04-08 ENCOUNTER — Ambulatory Visit (HOSPITAL_COMMUNITY)
Admission: RE | Admit: 2019-04-08 | Discharge: 2019-04-08 | Disposition: A | Discharge status: Home / Self Care | Payer: Medicare Other | Source: Ambulatory Visit | Attending: Pulmonary Disease | Admitting: Pulmonary Disease

## 2019-04-08 ENCOUNTER — Telehealth: Payer: Self-pay | Admitting: Pulmonary Disease

## 2019-04-08 ENCOUNTER — Encounter (HOSPITAL_COMMUNITY): Payer: Self-pay | Admitting: Interventional Radiology

## 2019-04-08 ENCOUNTER — Other Ambulatory Visit: Payer: Self-pay | Admitting: Pulmonary Disease

## 2019-04-08 DIAGNOSIS — R590 Localized enlarged lymph nodes: Secondary | ICD-10-CM

## 2019-04-08 DIAGNOSIS — C770 Secondary and unspecified malignant neoplasm of lymph nodes of head, face and neck: Secondary | ICD-10-CM | POA: Insufficient documentation

## 2019-04-08 DIAGNOSIS — Z20828 Contact with and (suspected) exposure to other viral communicable diseases: Secondary | ICD-10-CM | POA: Insufficient documentation

## 2019-04-08 HISTORY — PX: IR US GUIDE BX ASP/DRAIN: IMG2392

## 2019-04-08 MED ORDER — OXYCODONE-ACETAMINOPHEN 5-325 MG PO TABS: 1.0000 | ORAL_TABLET | ORAL | 0 refills | Status: DC | PRN

## 2019-04-08 MED ORDER — LIDOCAINE HCL 1 % IJ SOLN
INTRAMUSCULAR | Status: AC | PRN
  Administered 2019-04-08: 5 mL

## 2019-04-08 MED ORDER — LIDOCAINE HCL 1 % IJ SOLN
INTRAMUSCULAR | Status: AC
  Filled 2019-04-08: qty 20

## 2019-04-08 NOTE — Telephone Encounter (Signed)
Med has been sent in. Confirmed with pharmacy.   Call made to patient, made aware. Voiced understanding. Nothing further needed at this time.

## 2019-04-08 NOTE — Telephone Encounter (Signed)
Dr. Valeta Harms I didn't change his pharmacy. Can you please send the Rx in to [facility] and I can call to [facility] Thank you.

## 2019-04-08 NOTE — Addendum Note (Signed)
Addended by: Garner Nash on: 04/08/2019 01:22 PM   Modules accepted: Orders

## 2019-04-08 NOTE — Telephone Encounter (Signed)
Pt is returning call. Cb is [phone]

## 2019-04-08 NOTE — Telephone Encounter (Signed)
PCCM:  Resent Script to [employer] to [facility] Thanks Garner Nash, DO [facility] 04/08/2019 1:22 PM

## 2019-04-08 NOTE — Telephone Encounter (Signed)
I spoke to Justin Randall and advised her to have Dr. Valeta Harms resend the Rx because it was sent to the wrong pharmacy. Will route to Justin Randall so I can call pt when this is done.

## 2019-04-08 NOTE — Telephone Encounter (Signed)
Pt is returning call.  [phone].

## 2019-04-08 NOTE — Telephone Encounter (Signed)
ATC pt, no answer. Left message for pt to call back.  

## 2019-04-08 NOTE — Telephone Encounter (Signed)
Spoke with pt, he is requesting a refill on Oxycodone. He received the original prescription from the hospital and states he is still in pain. The hospital told him that he needed to call Dr. Valeta Harms to request a refill. Dr. Valeta Harms please advise.

## 2019-04-08 NOTE — Telephone Encounter (Signed)
PCCM: I sent a refills of oxycodone.  Dx: Cancer related pain Garner Nash, DO [facility] 04/08/2019 12:11 PM

## 2019-04-09 ENCOUNTER — Ambulatory Visit (HOSPITAL_COMMUNITY): Admission: RE | Admit: 2019-04-09 | Payer: Medicare Other | Source: Home / Self Care | Admitting: Pulmonary Disease

## 2019-04-09 ENCOUNTER — Encounter (HOSPITAL_COMMUNITY): Admission: RE | Payer: Self-pay | Source: Home / Self Care

## 2019-04-09 SURGERY — BRONCHOSCOPY, WITH EBUS
Anesthesia: General | Laterality: Right

## 2019-04-13 ENCOUNTER — Encounter: Payer: Self-pay | Admitting: Internal Medicine

## 2019-04-13 ENCOUNTER — Telehealth: Payer: Self-pay | Admitting: Pulmonary Disease

## 2019-04-13 DIAGNOSIS — C3491 Malignant neoplasm of unspecified part of right bronchus or lung: Secondary | ICD-10-CM | POA: Insufficient documentation

## 2019-04-13 NOTE — Telephone Encounter (Signed)
PCCM:  I called and spoke with the patient regarding his pathology results.  Patient will need to have an appointment set up with medical oncology ASAP.  I have placed the referral.  CC: Norton Blizzard, thoracic oncology coordinator, Dr. Earlie Server, Vivia Ewing, LPN   Garner Nash, DO [facility] 04/13/2019 4:25 PM

## 2019-04-13 NOTE — Telephone Encounter (Signed)
Thank you. We will take care of him.

## 2019-04-14 ENCOUNTER — Telehealth: Payer: Self-pay | Admitting: *Deleted

## 2019-04-14 DIAGNOSIS — C3491 Malignant neoplasm of unspecified part of right bronchus or lung: Secondary | ICD-10-CM

## 2019-04-14 NOTE — Telephone Encounter (Signed)
Thanks. I appreciate it BLI

## 2019-04-14 NOTE — Telephone Encounter (Signed)
Oncology Nurse Navigator Documentation  Oncology Nurse Navigator Flowsheets 04/14/2019  Navigator Location [facility]  Referral Date to RadOnc/MedOnc -  Navigator Encounter Type Telephone/I received referral from Dr. Valeta Harms.  Dr. Julien Nordmann updated and would like patient to be seen at [facility] tomorrow.  I called and spoke with patient.  He verbalized understanding of appt time and place.   Telephone Outgoing Call  Treatment Phase Pre-Tx/Tx Discussion  Barriers/Navigation Needs Coordination of Care;Education  Education Other  Interventions Coordination of Care;Education  Coordination of Care Appts  Education Method Verbal  Acuity Level 2  Time Spent with Patient 30

## 2019-04-15 ENCOUNTER — Encounter: Payer: Self-pay | Admitting: Internal Medicine

## 2019-04-15 ENCOUNTER — Inpatient Hospital Stay: Payer: Medicare Other | Attending: Internal Medicine | Admitting: Internal Medicine

## 2019-04-15 ENCOUNTER — Encounter: Payer: Self-pay | Admitting: *Deleted

## 2019-04-15 ENCOUNTER — Other Ambulatory Visit: Payer: Self-pay | Admitting: *Deleted

## 2019-04-15 ENCOUNTER — Telehealth: Payer: Self-pay

## 2019-04-15 ENCOUNTER — Ambulatory Visit
Admission: RE | Admit: 2019-04-15 | Discharge: 2019-04-15 | Disposition: A | Discharge status: Home / Self Care | Payer: Medicare Other | Source: Ambulatory Visit | Attending: Radiation Oncology | Admitting: Radiation Oncology

## 2019-04-15 ENCOUNTER — Other Ambulatory Visit: Payer: Self-pay

## 2019-04-15 ENCOUNTER — Inpatient Hospital Stay: Payer: Medicare Other

## 2019-04-15 ENCOUNTER — Telehealth: Payer: Self-pay | Admitting: Pulmonary Disease

## 2019-04-15 VITALS — BP 144/123 | HR 102 | Temp 99.2°F | Resp 20 | Ht 74.0 in | Wt 174.5 lb

## 2019-04-15 DIAGNOSIS — C3491 Malignant neoplasm of unspecified part of right bronchus or lung: Secondary | ICD-10-CM

## 2019-04-15 DIAGNOSIS — G893 Neoplasm related pain (acute) (chronic): Secondary | ICD-10-CM

## 2019-04-15 DIAGNOSIS — C770 Secondary and unspecified malignant neoplasm of lymph nodes of head, face and neck: Secondary | ICD-10-CM | POA: Diagnosis not present

## 2019-04-15 DIAGNOSIS — Z7189 Other specified counseling: Secondary | ICD-10-CM | POA: Insufficient documentation

## 2019-04-15 DIAGNOSIS — Z79899 Other long term (current) drug therapy: Secondary | ICD-10-CM | POA: Insufficient documentation

## 2019-04-15 DIAGNOSIS — M898X9 Other specified disorders of bone, unspecified site: Secondary | ICD-10-CM | POA: Insufficient documentation

## 2019-04-15 DIAGNOSIS — R110 Nausea: Secondary | ICD-10-CM | POA: Insufficient documentation

## 2019-04-15 DIAGNOSIS — C781 Secondary malignant neoplasm of mediastinum: Secondary | ICD-10-CM | POA: Insufficient documentation

## 2019-04-15 DIAGNOSIS — R221 Localized swelling, mass and lump, neck: Secondary | ICD-10-CM | POA: Diagnosis not present

## 2019-04-15 DIAGNOSIS — R634 Abnormal weight loss: Secondary | ICD-10-CM | POA: Diagnosis not present

## 2019-04-15 DIAGNOSIS — R5383 Other fatigue: Secondary | ICD-10-CM | POA: Insufficient documentation

## 2019-04-15 DIAGNOSIS — F419 Anxiety disorder, unspecified: Secondary | ICD-10-CM | POA: Diagnosis not present

## 2019-04-15 DIAGNOSIS — M542 Cervicalgia: Secondary | ICD-10-CM | POA: Insufficient documentation

## 2019-04-15 DIAGNOSIS — Z8711 Personal history of peptic ulcer disease: Secondary | ICD-10-CM | POA: Diagnosis not present

## 2019-04-15 DIAGNOSIS — J439 Emphysema, unspecified: Secondary | ICD-10-CM | POA: Insufficient documentation

## 2019-04-15 DIAGNOSIS — Z791 Long term (current) use of non-steroidal anti-inflammatories (NSAID): Secondary | ICD-10-CM | POA: Insufficient documentation

## 2019-04-15 DIAGNOSIS — C3411 Malignant neoplasm of upper lobe, right bronchus or lung: Secondary | ICD-10-CM | POA: Insufficient documentation

## 2019-04-15 DIAGNOSIS — Z5111 Encounter for antineoplastic chemotherapy: Secondary | ICD-10-CM | POA: Insufficient documentation

## 2019-04-15 DIAGNOSIS — C3490 Malignant neoplasm of unspecified part of unspecified bronchus or lung: Secondary | ICD-10-CM

## 2019-04-15 DIAGNOSIS — R091 Pleurisy: Secondary | ICD-10-CM | POA: Diagnosis not present

## 2019-04-15 DIAGNOSIS — F17210 Nicotine dependence, cigarettes, uncomplicated: Secondary | ICD-10-CM | POA: Diagnosis not present

## 2019-04-15 LAB — CBC WITH DIFFERENTIAL (CANCER CENTER ONLY)
Abs Immature Granulocytes: 0.04 10*3/uL (ref 0.00–0.07)
Basophils Absolute: 0.1 10*3/uL (ref 0.0–0.1)
Basophils Relative: 0 %
Eosinophils Absolute: 0.1 10*3/uL (ref 0.0–0.5)
Eosinophils Relative: 1 %
HCT: 45.7 % (ref 39.0–52.0)
Hemoglobin: 15.6 g/dL (ref 13.0–17.0)
Immature Granulocytes: 0 %
Lymphocytes Relative: 26 %
Lymphs Abs: 3.5 10*3/uL (ref 0.7–4.0)
MCH: 30.6 pg (ref 26.0–34.0)
MCHC: 34.1 g/dL (ref 30.0–36.0)
MCV: 89.6 fL (ref 80.0–100.0)
Monocytes Absolute: 1.1 10*3/uL — ABNORMAL HIGH (ref 0.1–1.0)
Monocytes Relative: 8 %
Neutro Abs: 8.8 10*3/uL — ABNORMAL HIGH (ref 1.7–7.7)
Neutrophils Relative %: 65 %
Platelet Count: 260 10*3/uL (ref 150–400)
RBC: 5.1 MIL/uL (ref 4.22–5.81)
RDW: 13.9 % (ref 11.5–15.5)
WBC Count: 13.6 10*3/uL — ABNORMAL HIGH (ref 4.0–10.5)
nRBC: 0 % (ref 0.0–0.2)

## 2019-04-15 LAB — CMP (CANCER CENTER ONLY)
ALT: <6 U/L (ref 0–44)
AST: 10 U/L — ABNORMAL LOW (ref 15–41)
Albumin: 3.8 g/dL (ref 3.5–5.0)
Alkaline Phosphatase: 95 U/L (ref 38–126)
Anion gap: 9 (ref 5–15)
BUN: 9 mg/dL (ref 6–20)
CO2: 28 mmol/L (ref 22–32)
Calcium: 9.9 mg/dL (ref 8.9–10.3)
Chloride: 104 mmol/L (ref 98–111)
Creatinine: 0.98 mg/dL (ref 0.61–1.24)
GFR, Est AFR Am: >60 mL/min (ref >60)
GFR, Estimated: >60 mL/min (ref >60)
Glucose, Bld: 93 mg/dL (ref 70–99)
Potassium: 3.9 mmol/L (ref 3.5–5.1)
Sodium: 141 mmol/L (ref 135–145)
Total Bilirubin: 0.3 mg/dL (ref 0.3–1.2)
Total Protein: 7.3 g/dL (ref 6.5–8.1)

## 2019-04-15 MED ORDER — TIZANIDINE HCL 4 MG PO TABS: 4.0000 mg | ORAL_TABLET | Freq: Three times a day (TID) | ORAL | 0 refills | Status: DC | PRN

## 2019-04-15 MED ORDER — PROCHLORPERAZINE MALEATE 10 MG PO TABS: 10.0000 mg | ORAL_TABLET | Freq: Four times a day (QID) | ORAL | 0 refills | Status: DC | PRN

## 2019-04-15 MED ORDER — OXYCODONE-ACETAMINOPHEN 10-325 MG PO TABS: 1.0000 | ORAL_TABLET | Freq: Four times a day (QID) | ORAL | 0 refills | Status: DC | PRN

## 2019-04-15 NOTE — Telephone Encounter (Signed)
Working on this

## 2019-04-15 NOTE — Telephone Encounter (Signed)
error 

## 2019-04-15 NOTE — Telephone Encounter (Signed)
Per BI please schedule as soon as possible. See message below.   Thanks.

## 2019-04-15 NOTE — Telephone Encounter (Signed)
Spoke with pt he would like a refill on oxycodone, Ibuprofen, and Zanaflex. Dr. Valeta Harms, are you willing to refill these medications.

## 2019-04-15 NOTE — Telephone Encounter (Signed)
PCCM:  New prescription sent to pharmacy.  Please inform the patient that we increased his dose.  He should only take 1 of these every 4-6 hours as needed.  Opiates prescribed for cancer related pain.  New diagnosis of metastatic lung cancer.  Large neck mass with potential bony involvement.  Meds ordered this encounter  Medications  . oxyCODONE-acetaminophen (PERCOCET) 10-325 MG tablet    Sig: Take 1 tablet by mouth every 6 (six) hours as needed for pain (cancer related pain).    Dispense:  60 tablet    Refill:  0   He should also have follow-up to establish care with oncology shortly.  Garner Nash, DO [facility] 04/15/2019 11:29 AM

## 2019-04-15 NOTE — Telephone Encounter (Signed)
PCCM:  Case discussed at [facility]: Lambert Rhonda W.   PET and Brain MRI Ordered.   Oncology appointments have been scheduled   Garner Nash, DO [place] [facility] 04/15/2019 7:29 AM

## 2019-04-15 NOTE — Progress Notes (Signed)
Oncology Nurse Navigator Documentation  Oncology Nurse Navigator Flowsheets 04/15/2019  Navigator Location [facility]  Referral Date to RadOnc/MedOnc -  Navigator Encounter Type Clinic/MDC/I spoke with patient at thoracic clinic today.  I helped to explain treatment plan and next steps.  Lung cancer booklet provided.   Telephone -  [facility] Date 04/15/2019  Patient Visit Type MedOnc  Treatment Phase Pre-Tx/Tx Discussion  Barriers/Navigation Needs Education  Education Newly Diagnosed Cancer Education  Interventions Education  Coordination of Care -  Education Method Verbal;Written  Acuity Level 2  Time Spent with Patient 30

## 2019-04-15 NOTE — Progress Notes (Addendum)
START ON PATHWAY REGIMEN - Non-Small Cell Lung     Administer weekly:     Paclitaxel      Carboplatin   **Always confirm dose/schedule in your pharmacy ordering system**  Patient Characteristics: Stage III - Unresectable, PS = 0, 1 AJCC T Category: T1c Current Disease Status: No Distant Mets or Local Recurrence AJCC N Category: N3 AJCC M Category: M0 AJCC 8 Stage Grouping: IIIB ECOG Performance Status: 1 Intent of Therapy: Curative Intent, Discussed with Patient

## 2019-04-15 NOTE — Progress Notes (Signed)
The proposed treatment in cancer conference 9/10 is for discussion purpose only and is not a binding recommendation.  The patient was not physically examined nor present for their treatment options.  Therefore, final treatment plans cannot be decided.

## 2019-04-15 NOTE — Progress Notes (Signed)
Radiation Oncology         [phone]) [phone] ________________________________  Name: Justin Randall        MRN: [id number]  Date of Service: 04/15/2019 DOB: 11/22/1970  [id number], Dalbert Batman, MD  Garner Nash, DO     REFERRING PHYSICIAN: Garner Nash, DO   DIAGNOSIS: The encounter diagnosis was Adenocarcinoma, lung, right ([place]).   HISTORY OF PRESENT ILLNESS: Justin Randall is a 48 y.o. male seen at the request of Dr. Valeta Harms for a newly diagnosed right lung cancer. The patient is presented to the ED on 03/28/2019 with terrible right neck pain. He was found on CT imaging of the neck to have a centrally necrotic appearing level 2 node measureing 8 mm, as well as an enlarged right level 3 node,  posterior triangle node measuring 15 x 21 x 13 mm, infiltration of the sternocleidomastoid muscle and enhancing mass lesion suggesting necrosis measuring 34 x 22 x 23 cm, with more lateral right supraclavicular adenopathy measuring up to 17 mm. A nodal chest wall mass at the thoracic inlet measured 27 x 22 x 21 mm, as well as 22 x 18 x 15 mm left supraclavicular nodes, and high right paratracheal node measuring 15 x 9 mm, and a left paratracheal node measuring 12 mm. He is scheduled tomorrow for PET and MRI. He is seen in multidisciplinary thoracic oncology conference to discuss options of treatment for his cancer.    PREVIOUS RADIATION THERAPY: No   PAST MEDICAL HISTORY:  Past Medical History:  Diagnosis Date   Anxiety    Cancer ([place])    Sciatica    Ulcer of the stomach and intestine        PAST SURGICAL HISTORY: Past Surgical History:  Procedure Laterality Date   IR US GUIDE BX ASP/DRAIN  04/08/2019   NO PAST SURGERIES       FAMILY HISTORY:  Family History  Problem Relation Age of Onset   Healthy Mother      SOCIAL HISTORY:  reports that he has been smoking. He has a 30.00 pack-year smoking history. He has never used smokeless tobacco. He reports current drug use. Drug:  Marijuana. He reports that he does not drink alcohol. The patient is married and lives in [place]. He is on disability for his low back sciatica but used to work in [employer].   ALLERGIES: Patient has no known allergies.   MEDICATIONS:  Current Outpatient Medications  Medication Sig Dispense Refill   ibuprofen (ADVIL) 600 MG tablet Take 1 tablet (600 mg total) by mouth every 6 (six) hours as needed. 30 tablet 0   oxyCODONE-acetaminophen (PERCOCET) 10-325 MG tablet Take 1 tablet by mouth every 6 (six) hours as needed for pain (cancer related pain). 60 tablet 0   tiZANidine (ZANAFLEX) 4 MG tablet Take 1 tablet (4 mg total) by mouth every 8 (eight) hours as needed for muscle spasms. 30 tablet 0   No current facility-administered medications for this encounter.      REVIEW OF SYSTEMS: On review of systems, the patient reports that he is doing okay overall. He reports that about 3 weeks ago he developed abrupt onset of pain and difficulty with movement of the right neck. He takes oxycodone for pain and reports this gives 70-80% relief but he wishes he could rest more comfortably at night. He does complain of some discomfort in the chest laying flat, but he denies any sternal chest pain, shortness of breath, cough, fevers, chills, night sweats,  unintended weight changes. He denies any bowel or bladder disturbances, and denies abdominal pain, nausea or vomiting. He does have chronic sciatica, mostly down the RLE. He denies any new musculoskeletal or joint aches or pains. A complete review of systems is obtained and is otherwise negative.     PHYSICAL EXAM:  Wt Readings from Last 3 Encounters:  04/07/19 175 lb 6.4 oz (79.6 kg)  04/06/19 174 lb 6.4 oz (79.1 kg)  04/05/19 190 lb (86.2 kg)   Temp Readings from Last 3 Encounters:  04/07/19 (!) 97.2 F (36.2 C) (Temporal)  04/06/19 97.7 F (36.5 C)  04/05/19 98.6 F (37 C) (Oral)   BP Readings from Last 3 Encounters:  04/07/19  (!) 140/100  04/06/19 (!) 131/99  04/05/19 (!) 141/83   Pulse Readings from Last 3 Encounters:  04/07/19 93  04/06/19 (!) 103  03/30/19 79  In general this is a well appearing African American male in no acute distress. He's alert and oriented x4 and appropriate throughout the examination. Cardiopulmonary assessment is negative for acute distress and he exhibits normal effort. He has visible edema of the right neck and supraclavicular site. He appears to have intact ROM but does have limitations due to pain.   ECOG = 1  0 - Asymptomatic (Fully active, able to carry on all predisease activities without restriction)  1 - Symptomatic but completely ambulatory (Restricted in physically strenuous activity but ambulatory and able to carry out work of a light or sedentary nature. For example, light housework, office work)  2 - Symptomatic, <50% in bed during the day (Ambulatory and capable of all self care but unable to carry out any work activities. Up and about more than 50% of waking hours)  3 - Symptomatic, >50% in bed, but not bedbound (Capable of only limited self-care, confined to bed or chair 50% or more of waking hours)  4 - Bedbound (Completely disabled. Cannot carry on any self-care. Totally confined to bed or chair)  5 - Death   Eustace Pen MM, Creech RH, Tormey DC, et al. [id number]). "Toxicity and response criteria of the [facility] Group". [employer] Oncol. 5 (6): 649-55    LABORATORY DATA:  Lab Results  Component Value Date   WBC 13.8 (H) 04/06/2019   HGB 16.3 04/06/2019   HCT 49.1 04/06/2019   MCV 92.1 04/06/2019   PLT 261 04/06/2019   Lab Results  Component Value Date   NA 137 04/06/2019   K 3.6 04/06/2019   CL 101 04/06/2019   CO2 24 04/06/2019   Lab Results  Component Value Date   ALT 9 04/06/2019   AST 12 (L) 04/06/2019   ALKPHOS 87 04/06/2019   BILITOT 0.4 04/06/2019      RADIOGRAPHY: Dg Chest 2 View  Result Date: 03/22/2019 CLINICAL  DATA:  Chest pain EXAM: CHEST - 2 VIEW COMPARISON:  None. FINDINGS: There is scarring and bullous disease in the right apex. Lungs elsewhere are clear. Heart size and pulmonary vascularity are normal. No adenopathy. No bone lesions. No evident pneumothorax. There are scattered rounded metallic foreign bodies. IMPRESSION: Scarring and bullous disease right apex. Lungs elsewhere clear. Cardiac silhouette within normal limits. Electronically Signed   By: Lowella Grip III M.D.   On: 03/22/2019 10:40   Ct Soft Tissue Neck W Contrast  Result Date: 03/28/2019 CLINICAL DATA:  Neck pain, initial exam R sided neck pain and swelling; please extend to past clavicle. Pain is present when the patient lies down.  EXAM: CT NECK WITH CONTRAST TECHNIQUE: Multidetector CT imaging of the neck was performed using the standard protocol following the bolus administration of intravenous contrast. CONTRAST:  3mL OMNIPAQUE IOHEXOL 300 MG/ML  SOLN COMPARISON:  Two-view chest x-ray 03/22/2019 FINDINGS: Pharynx and larynx: No focal mucosal or submucosal lesions are present. Punctate calcification in the right palatine tonsil consistent with prior infection or inflammation. There is mild prominence of the adenoid tissue without a discrete mass. Epiglottis is normal. Hypopharynx is within normal limits. Vocal cords are midline and symmetric. Salivary glands: The submandibular and parotid glands are within normal limits bilaterally. There is no duct dilation. Thyroid: Normal Lymph nodes: 2 benign submental lymph nodes are present. Benign subcentimeter submandibular nodes are present bilaterally. A centrally necrotic posterior right level 2 lymph node measures 8 mm on image 68 of series 8. Enlarged posterior right level 3 and posterior triangle lymph nodes are present. A high posterior triangle lymph node on the right measures 15 x 21 x 13 mm on image 94 of series 8. There is infiltration of the sternocleidomastoid muscle. Enhancing mass  lesion with mixed density suggesting necrosis measures 34 x 22 x 23 mm. More lateral right supraclavicular lymph node also demonstrates central necrosis, measuring up to 17 mm. A nodal mass at the anterior chest wall at the thoracic inlet measures 27 x 22 x 21 mm. Left supraclavicular lymph nodes are similar in appearance. The largest node is at the thoracic inlet measuring 22 x 18 x 15 mm. A high right paratracheal lymph node measures 15 x 9 mm. A left paratracheal node measures 12 mm. Vascular: No significant vascular calcifications or stenosis is present. Limited intracranial: Negative. Visualized orbits: The globes and orbits are within normal limits. Mastoids and visualized paranasal sinuses: The paranasal sinuses and mastoid air cells are clear. Skeleton: There is straightening of the normal cervical lordosis. Upper chest: Ill-defined irregular airspace opacity is present at the right lung apex measuring at least 4.4 x 2.4 x 1.7 cm. Paraseptal and centrilobular emphysematous changes are present. IMPRESSION: 1. Multiple enlarged necrotic right greater than left lymph nodes concerning for metastatic disease. Although the differential diagnosis includes infectious etiologies such as TB or cat scratch disease, malignancy is most likely. 2. Heterogeneous mass infiltrating the right sternocleidomastoid muscle measures up to 3.4 cm. 3. No primary lesion in the neck. 4. Right upper lobe mass lesion is partially imaged. This may represent a primary lung tumor. Recommend CT of the chest, abdomen, and pelvis with contrast for further evaluation. 5. Left supraclavicular and thoracic inlet lymph nodes are present, similar to those on the right. 6. Bilateral upper mediastinal adenopathy. 7. Emphysema (ICD10-J43.9). Centrilobular and paraseptal emphysematous changes are present. These results were called by telephone at the time of interpretation on 03/28/2019 at 10:04 am to Dr. Eliezer Mccoy , who verbally acknowledged these  results. Electronically Signed   By: San Morelle M.D.   On: 03/28/2019 10:08   Ct Chest W Contrast  Result Date: 03/30/2019 CLINICAL DATA:  Cervical and upper thoracic adenopathy on neck CT. Right-sided neck pain and swelling. EXAM: CT CHEST, ABDOMEN, AND PELVIS WITH CONTRAST TECHNIQUE: Multidetector CT imaging of the chest, abdomen and pelvis was performed following the standard protocol during bolus administration of intravenous contrast. CONTRAST:  161mL OMNIPAQUE IOHEXOL 300 MG/ML  SOLN COMPARISON:  Neck CT 03/28/2019.  Chest radiograph 03/22/2019. FINDINGS: CT CHEST FINDINGS Cardiovascular: Aortic atherosclerosis. Normal heart size, without pericardial effusion. No central pulmonary embolism, on this non-dedicated study. Mediastinum/Nodes:  Supraclavicular adenopathy as detailed on prior dedicated CT. 1.0 cm right axillary node on 25/3, borderline sized. Right paratracheal node measures 1.7 cm on 25/3. Borderline size right hilar node of 1.1 cm on 30/3. Periesophageal adenopathy including at 1.9 cm on 27/3. Necrotic nodal mass within the anterior mediastinum including at 2.4 x 4.4 cm on 34/3. Lungs/Pleura: No pleural fluid. Bullous emphysema, most significant in the apices. Right apical centrally cavitary lung mass measures 4.3 x 3.1 cm on 40/15. 3.2 cm craniocaudal on coronal image 69. Extension or satellite lesion medially including at 9 mm on 49/5. Musculoskeletal: Metallic foreign bodies about the left chest wall. Anterior chest wall subcutaneous edema, especially about the anterior left sternum on 34/3. CT ABDOMEN PELVIS FINDINGS Hepatobiliary: Focal steatosis adjacent the falciform ligament. Too small to characterize 2 mm lesion in segment 2 of the liver. Normal gallbladder, without biliary ductal dilatation. Pancreas: Normal, without mass or ductal dilatation. Spleen: Normal in size, without focal abnormality. Adrenals/Urinary Tract: Normal adrenal glands. Normal kidneys, without  hydronephrosis. Normal urinary bladder. Stomach/Bowel: Normal stomach, without wall thickening. Normal colon, appendix, and terminal ileum. Normal small bowel. Vascular/Lymphatic: Advanced aortic and branch vessel atherosclerosis. No abdominopelvic adenopathy. Reproductive: Normal prostate. Other: No significant free fluid. No evidence of omental or peritoneal disease. Musculoskeletal: Lumbosacral spondylosis. IMPRESSION: CT CHEST IMPRESSION 1. Right apical lung mass, highly suspicious for primary bronchogenic carcinoma. Given location and morphology, tuberculosis could look similar. 2. Extensive thoracic adenopathy, most consistent with metastatic disease. 3. Aortic atherosclerosis (ICD10-I70.0) and emphysema (ICD10-J43.9). 4. Anterior chest wall subcutaneous edema is nonspecific. Question cellulitis. Consider physical exam correlation. CT ABDOMEN AND PELVIS IMPRESSION No acute process or evidence of metastatic disease in the abdomen or pelvis. Electronically Signed   By: Abigail Miyamoto M.D.   On: 03/30/2019 16:49   Ct Abdomen Pelvis W Contrast  Result Date: 03/30/2019 CLINICAL DATA:  Cervical and upper thoracic adenopathy on neck CT. Right-sided neck pain and swelling. EXAM: CT CHEST, ABDOMEN, AND PELVIS WITH CONTRAST TECHNIQUE: Multidetector CT imaging of the chest, abdomen and pelvis was performed following the standard protocol during bolus administration of intravenous contrast. CONTRAST:  197mL OMNIPAQUE IOHEXOL 300 MG/ML  SOLN COMPARISON:  Neck CT 03/28/2019.  Chest radiograph 03/22/2019. FINDINGS: CT CHEST FINDINGS Cardiovascular: Aortic atherosclerosis. Normal heart size, without pericardial effusion. No central pulmonary embolism, on this non-dedicated study. Mediastinum/Nodes: Supraclavicular adenopathy as detailed on prior dedicated CT. 1.0 cm right axillary node on 25/3, borderline sized. Right paratracheal node measures 1.7 cm on 25/3. Borderline size right hilar node of 1.1 cm on 30/3.  Periesophageal adenopathy including at 1.9 cm on 27/3. Necrotic nodal mass within the anterior mediastinum including at 2.4 x 4.4 cm on 34/3. Lungs/Pleura: No pleural fluid. Bullous emphysema, most significant in the apices. Right apical centrally cavitary lung mass measures 4.3 x 3.1 cm on 40/15. 3.2 cm craniocaudal on coronal image 69. Extension or satellite lesion medially including at 9 mm on 49/5. Musculoskeletal: Metallic foreign bodies about the left chest wall. Anterior chest wall subcutaneous edema, especially about the anterior left sternum on 34/3. CT ABDOMEN PELVIS FINDINGS Hepatobiliary: Focal steatosis adjacent the falciform ligament. Too small to characterize 2 mm lesion in segment 2 of the liver. Normal gallbladder, without biliary ductal dilatation. Pancreas: Normal, without mass or ductal dilatation. Spleen: Normal in size, without focal abnormality. Adrenals/Urinary Tract: Normal adrenal glands. Normal kidneys, without hydronephrosis. Normal urinary bladder. Stomach/Bowel: Normal stomach, without wall thickening. Normal colon, appendix, and terminal ileum. Normal small bowel. Vascular/Lymphatic: Advanced  aortic and branch vessel atherosclerosis. No abdominopelvic adenopathy. Reproductive: Normal prostate. Other: No significant free fluid. No evidence of omental or peritoneal disease. Musculoskeletal: Lumbosacral spondylosis. IMPRESSION: CT CHEST IMPRESSION 1. Right apical lung mass, highly suspicious for primary bronchogenic carcinoma. Given location and morphology, tuberculosis could look similar. 2. Extensive thoracic adenopathy, most consistent with metastatic disease. 3. Aortic atherosclerosis (ICD10-I70.0) and emphysema (ICD10-J43.9). 4. Anterior chest wall subcutaneous edema is nonspecific. Question cellulitis. Consider physical exam correlation. CT ABDOMEN AND PELVIS IMPRESSION No acute process or evidence of metastatic disease in the abdomen or pelvis. Electronically Signed   By: Abigail Miyamoto M.D.   On: 03/30/2019 16:49   Ir US Guide Bx Asp/drain  Result Date: 04/08/2019 INDICATION: 48 year old male with suspected metastatic lung cancer and right supraclavicular lymphadenopathy. He presents for biopsy to confirm tissue diagnosis. EXAM: Ultrasound-guided core biopsy right supraclavicular lymph node MEDICATIONS: None. ANESTHESIA/SEDATION: None.  Performed under local anesthetic only. FLUOROSCOPY TIME:  None. COMPLICATIONS: None immediate. PROCEDURE: Informed written consent was obtained from the patient after a thorough discussion of the procedural risks, benefits and alternatives. All questions were addressed. Maximal Sterile Barrier Technique was utilized including caps, mask, sterile gowns, sterile gloves, sterile drape, hand hygiene and skin antiseptic. A timeout was performed prior to the initiation of the procedure. Ultrasound was used to interrogate the right supraclavicular space. Enlarged, irregular and lobulated right supraclavicular lymph node identified measuring 3.2 x 2.0 cm. A suitable skin entry site was selected and marked. Local anesthesia was attained by infiltration with 1% lidocaine. A small dermatotomy was made. Under real-time ultrasound guidance, multiple 18 gauge core biopsies were obtained coaxially using the [employer] mission automated biopsy device. Biopsy specimens were placed in formalin and delivered to pathology for further analysis. There was no immediate complication. IMPRESSION: Successful ultrasound-guided core biopsy of right supraclavicular lymphadenopathy. Electronically Signed   By: Jacqulynn Cadet M.D.   On: 04/08/2019 11:08       IMPRESSION/PLAN: 1. Locally advanced, at least Stage III NSCLC, adenocarcinoma of the RUL. Dr. Lisbeth Renshaw discusses the pathology findings and reviews the nature of locally advanced and also metastatic lung cancer. He agrees with completing the work up for PET and MRI.  We discussed the risks, benefits, short, and long term effects  of radiotherapy. The recommendation would either be for chemoRT versus palliative radiotherapy and palliative chemotherapy. The patient is interested in proceeding with the most relevant form of radiotherapy in the treatment of his cancer. Dr. Lisbeth Renshaw discusses the delivery and logistics of radiotherapy, and we will follow up with tomorrows test results and make recommendations to move forward either for chemoRT versus palliative radiotherapy. 2. Neck pain from adenopathy. He will continue Oxycodone per Dr. Valeta Harms and we anticipate radiotherapy will also improve his symptoms as well.   In a visit lasting 30 minutes, greater than 50% of the time was spent face to face discussing his case, and coordinating the patient's care.  The above documentation reflects my direct findings during this shared patient visit. Please see the separate note by Dr. Lisbeth Renshaw on this date for the remainder of the patient's plan of care.    Carola Rhine, PAC

## 2019-04-15 NOTE — Progress Notes (Signed)
[place] [phone]   Fax:([phone] Multidisciplinary thoracic oncology clinic  CONSULT NOTE  REFERRING PHYSICIAN: Dr. Leory Plowman Icard  REASON FOR CONSULTATION:  48 years old African-American male recently diagnosed with lung cancer.  HPI Justin Randall is a 48 y.o. male with past medical history significant for anxiety, sciatica, gastric ulcers as well as long history for smoking.  The patient presented to the emergency department complaining of the swelling and pain on the right side of the neck a few days duration. CT scan of the neck performed on 03/28/2019 showed multiple enlarged necrotic right greater than left neck lymph nodes concerning for metastatic disease.  There was also heterogeneous mass infiltrating the right sternocleidomastoid muscle measuring up to 3.4 cm.  No primary lesion in the neck.  There was also right upper lobe mass lesion that was partially imaged on that scan suspicious for primary lung tumor.  The scan also showed left supraclavicular and thoracic inlet lymph nodes similar to those on the right.  There was also bilateral upper mediastinal adenopathy.  On 03/30/2019 the patient had CT scan of the chest, abdomen pelvis and that showed the supraclavicular adenopathy that was reported on the CT of the neck.  There was also 1.0 cm right axillary lymph node, right paratracheal lymph node measuring 1.7 cm, borderline right hilar lymph node measuring 1.1 cm, periesophageal adenopathy including 1.9 cm, necrotic nodal mass within the anterior mediastinum including 2.4 x 4.4 cm.  There was also right apical centrally cavitary lung mass measuring 4.3 x 3.1 x 3.2 cm.  There was extension or satellite lesion medially measuring 0.9 cm.  There was no acute process or evidence of metastatic disease in the abdomen or pelvis. The patient was referred to Dr. Valeta Harms and he order ultrasound-guided biopsy of the right supraclavicular lymph nodes that was  performed on 04/08/2019. The final pathology ([id number]) showed poorly differentiated adenocarcinoma consistent with lung primary. The core biopsies are extensively involved by poorly differentiated carcinoma. Immunohistochemistry shows strong positivity with cytokeratin 7, MOC-31 and TTF-1. The tumor is negative with CDX-2, cytokeratin 20, cytokeratin 5/6, p63, Napsin A and mucicarmine. The immunophenotype is most consistent with primary lung adenocarcinoma. Dr. Valeta Harms kindly referred the patient to the multidisciplinary thoracic oncology clinic today for further evaluation and recommendation regarding treatment of his condition. When seen today he continues to complain of the right neck swelling as well as dry cough but no significant chest pain, shortness of breath or hemoptysis.  He lost few pounds recently.  He denied having any nausea, vomiting, diarrhea or constipation.  He has intermittent headache but no blurry or double vision. Family history: No significant family history of malignancy. Mother had hip replacement and father has unknown history. The patient is married and has no biological children.  He is currently on disability secondary to back pain and sciatica.  He has a history of smoking 1 pack/day for around 30 years and he is trying to quit.  He has no history of alcohol or drug abuse.  HPI  Past Medical History:  Diagnosis Date   Anxiety    Cancer ([place])    Sciatica    Ulcer of the stomach and intestine     Past Surgical History:  Procedure Laterality Date   IR US GUIDE BX ASP/DRAIN  04/08/2019   NO PAST SURGERIES      Family History  Problem Relation Age of Onset   Healthy Mother     Social History  Social History   Tobacco Use   Smoking status: Current Every Day Smoker    Packs/day: 1.00    Years: 30.00    Pack years: 30.00   Smokeless tobacco: Never Used  Substance Use Topics   Alcohol use: No   Drug use: Yes    Types: Marijuana    No Known  Allergies  Current Outpatient Medications  Medication Sig Dispense Refill   ibuprofen (ADVIL) 600 MG tablet Take 1 tablet (600 mg total) by mouth every 6 (six) hours as needed. 30 tablet 0   oxyCODONE-acetaminophen (PERCOCET) 10-325 MG tablet Take 1 tablet by mouth every 6 (six) hours as needed for pain (cancer related pain). 60 tablet 0   tiZANidine (ZANAFLEX) 4 MG tablet Take 1 tablet (4 mg total) by mouth every 8 (eight) hours as needed for muscle spasms. 30 tablet 0   No current facility-administered medications for this visit.     Review of Systems  Constitutional: positive for fatigue and weight loss Eyes: negative Ears, nose, mouth, throat, and face: negative Respiratory: positive for cough Cardiovascular: negative Gastrointestinal: negative Genitourinary:negative Integument/breast: negative Hematologic/lymphatic: negative Musculoskeletal:positive for neck pain Neurological: negative Behavioral/Psych: negative Endocrine: negative Allergic/Immunologic: negative  Physical Exam  UEA:VWUJW, healthy, no distress, well nourished, well developed and anxious SKIN: skin color, texture, turgor are normal, no rashes or significant lesions HEAD: Normocephalic, No masses, lesions, tenderness or abnormalities EYES: normal, PERRLA, Conjunctiva are pink and non-injected EARS: External ears normal, Canals clear OROPHARYNX:no exudate, no erythema and lips, buccal mucosa, and tongue normal  NECK: large tender anterior cervical adenopathy bilaterally, right more than left. LYMPH:  enlarged lymph nodes palpated on the right side of the neck. LUNGS: clear to auscultation , and palpation HEART: regular rate & rhythm, no murmurs and no gallops ABDOMEN:abdomen soft, non-tender, normal bowel sounds and no masses or organomegaly BACK: No CVA tenderness, Range of motion is normal EXTREMITIES:no joint deformities, effusion, or inflammation, no edema  NEURO: alert & oriented x 3 with fluent  speech, no focal motor/sensory deficits  PERFORMANCE STATUS: ECOG 1  LABORATORY DATA: Lab Results  Component Value Date   WBC 13.6 (H) 04/15/2019   HGB 15.6 04/15/2019   HCT 45.7 04/15/2019   MCV 89.6 04/15/2019   PLT 260 04/15/2019      Chemistry      Component Value Date/Time   NA 137 04/06/2019 1109   K 3.6 04/06/2019 1109   CL 101 04/06/2019 1109   CO2 24 04/06/2019 1109   BUN 8 04/06/2019 1109   CREATININE 1.07 04/06/2019 1109      Component Value Date/Time   CALCIUM 9.8 04/06/2019 1109   ALKPHOS 87 04/06/2019 1109   AST 12 (L) 04/06/2019 1109   ALT 9 04/06/2019 1109   BILITOT 0.4 04/06/2019 1109       RADIOGRAPHIC STUDIES: Dg Chest 2 View  Result Date: 03/22/2019 CLINICAL DATA:  Chest pain EXAM: CHEST - 2 VIEW COMPARISON:  None. FINDINGS: There is scarring and bullous disease in the right apex. Lungs elsewhere are clear. Heart size and pulmonary vascularity are normal. No adenopathy. No bone lesions. No evident pneumothorax. There are scattered rounded metallic foreign bodies. IMPRESSION: Scarring and bullous disease right apex. Lungs elsewhere clear. Cardiac silhouette within normal limits. Electronically Signed   By: Lowella Grip III M.D.   On: 03/22/2019 10:40   Ct Soft Tissue Neck W Contrast  Result Date: 03/28/2019 CLINICAL DATA:  Neck pain, initial exam R sided neck pain and swelling;  please extend to past clavicle. Pain is present when the patient lies down. EXAM: CT NECK WITH CONTRAST TECHNIQUE: Multidetector CT imaging of the neck was performed using the standard protocol following the bolus administration of intravenous contrast. CONTRAST:  59mL OMNIPAQUE IOHEXOL 300 MG/ML  SOLN COMPARISON:  Two-view chest x-ray 03/22/2019 FINDINGS: Pharynx and larynx: No focal mucosal or submucosal lesions are present. Punctate calcification in the right palatine tonsil consistent with prior infection or inflammation. There is mild prominence of the adenoid tissue  without a discrete mass. Epiglottis is normal. Hypopharynx is within normal limits. Vocal cords are midline and symmetric. Salivary glands: The submandibular and parotid glands are within normal limits bilaterally. There is no duct dilation. Thyroid: Normal Lymph nodes: 2 benign submental lymph nodes are present. Benign subcentimeter submandibular nodes are present bilaterally. A centrally necrotic posterior right level 2 lymph node measures 8 mm on image 68 of series 8. Enlarged posterior right level 3 and posterior triangle lymph nodes are present. A high posterior triangle lymph node on the right measures 15 x 21 x 13 mm on image 94 of series 8. There is infiltration of the sternocleidomastoid muscle. Enhancing mass lesion with mixed density suggesting necrosis measures 34 x 22 x 23 mm. More lateral right supraclavicular lymph node also demonstrates central necrosis, measuring up to 17 mm. A nodal mass at the anterior chest wall at the thoracic inlet measures 27 x 22 x 21 mm. Left supraclavicular lymph nodes are similar in appearance. The largest node is at the thoracic inlet measuring 22 x 18 x 15 mm. A high right paratracheal lymph node measures 15 x 9 mm. A left paratracheal node measures 12 mm. Vascular: No significant vascular calcifications or stenosis is present. Limited intracranial: Negative. Visualized orbits: The globes and orbits are within normal limits. Mastoids and visualized paranasal sinuses: The paranasal sinuses and mastoid air cells are clear. Skeleton: There is straightening of the normal cervical lordosis. Upper chest: Ill-defined irregular airspace opacity is present at the right lung apex measuring at least 4.4 x 2.4 x 1.7 cm. Paraseptal and centrilobular emphysematous changes are present. IMPRESSION: 1. Multiple enlarged necrotic right greater than left lymph nodes concerning for metastatic disease. Although the differential diagnosis includes infectious etiologies such as TB or cat  scratch disease, malignancy is most likely. 2. Heterogeneous mass infiltrating the right sternocleidomastoid muscle measures up to 3.4 cm. 3. No primary lesion in the neck. 4. Right upper lobe mass lesion is partially imaged. This may represent a primary lung tumor. Recommend CT of the chest, abdomen, and pelvis with contrast for further evaluation. 5. Left supraclavicular and thoracic inlet lymph nodes are present, similar to those on the right. 6. Bilateral upper mediastinal adenopathy. 7. Emphysema (ICD10-J43.9). Centrilobular and paraseptal emphysematous changes are present. These results were called by telephone at the time of interpretation on 03/28/2019 at 10:04 am to Dr. Eliezer Mccoy , who verbally acknowledged these results. Electronically Signed   By: San Morelle M.D.   On: 03/28/2019 10:08   Ct Chest W Contrast  Result Date: 03/30/2019 CLINICAL DATA:  Cervical and upper thoracic adenopathy on neck CT. Right-sided neck pain and swelling. EXAM: CT CHEST, ABDOMEN, AND PELVIS WITH CONTRAST TECHNIQUE: Multidetector CT imaging of the chest, abdomen and pelvis was performed following the standard protocol during bolus administration of intravenous contrast. CONTRAST:  118mL OMNIPAQUE IOHEXOL 300 MG/ML  SOLN COMPARISON:  Neck CT 03/28/2019.  Chest radiograph 03/22/2019. FINDINGS: CT CHEST FINDINGS Cardiovascular: Aortic atherosclerosis. Normal heart  size, without pericardial effusion. No central pulmonary embolism, on this non-dedicated study. Mediastinum/Nodes: Supraclavicular adenopathy as detailed on prior dedicated CT. 1.0 cm right axillary node on 25/3, borderline sized. Right paratracheal node measures 1.7 cm on 25/3. Borderline size right hilar node of 1.1 cm on 30/3. Periesophageal adenopathy including at 1.9 cm on 27/3. Necrotic nodal mass within the anterior mediastinum including at 2.4 x 4.4 cm on 34/3. Lungs/Pleura: No pleural fluid. Bullous emphysema, most significant in the apices.  Right apical centrally cavitary lung mass measures 4.3 x 3.1 cm on 40/15. 3.2 cm craniocaudal on coronal image 69. Extension or satellite lesion medially including at 9 mm on 49/5. Musculoskeletal: Metallic foreign bodies about the left chest wall. Anterior chest wall subcutaneous edema, especially about the anterior left sternum on 34/3. CT ABDOMEN PELVIS FINDINGS Hepatobiliary: Focal steatosis adjacent the falciform ligament. Too small to characterize 2 mm lesion in segment 2 of the liver. Normal gallbladder, without biliary ductal dilatation. Pancreas: Normal, without mass or ductal dilatation. Spleen: Normal in size, without focal abnormality. Adrenals/Urinary Tract: Normal adrenal glands. Normal kidneys, without hydronephrosis. Normal urinary bladder. Stomach/Bowel: Normal stomach, without wall thickening. Normal colon, appendix, and terminal ileum. Normal small bowel. Vascular/Lymphatic: Advanced aortic and branch vessel atherosclerosis. No abdominopelvic adenopathy. Reproductive: Normal prostate. Other: No significant free fluid. No evidence of omental or peritoneal disease. Musculoskeletal: Lumbosacral spondylosis. IMPRESSION: CT CHEST IMPRESSION 1. Right apical lung mass, highly suspicious for primary bronchogenic carcinoma. Given location and morphology, tuberculosis could look similar. 2. Extensive thoracic adenopathy, most consistent with metastatic disease. 3. Aortic atherosclerosis (ICD10-I70.0) and emphysema (ICD10-J43.9). 4. Anterior chest wall subcutaneous edema is nonspecific. Question cellulitis. Consider physical exam correlation. CT ABDOMEN AND PELVIS IMPRESSION No acute process or evidence of metastatic disease in the abdomen or pelvis. Electronically Signed   By: Abigail Miyamoto M.D.   On: 03/30/2019 16:49   Ct Abdomen Pelvis W Contrast  Result Date: 03/30/2019 CLINICAL DATA:  Cervical and upper thoracic adenopathy on neck CT. Right-sided neck pain and swelling. EXAM: CT CHEST, ABDOMEN, AND  PELVIS WITH CONTRAST TECHNIQUE: Multidetector CT imaging of the chest, abdomen and pelvis was performed following the standard protocol during bolus administration of intravenous contrast. CONTRAST:  192mL OMNIPAQUE IOHEXOL 300 MG/ML  SOLN COMPARISON:  Neck CT 03/28/2019.  Chest radiograph 03/22/2019. FINDINGS: CT CHEST FINDINGS Cardiovascular: Aortic atherosclerosis. Normal heart size, without pericardial effusion. No central pulmonary embolism, on this non-dedicated study. Mediastinum/Nodes: Supraclavicular adenopathy as detailed on prior dedicated CT. 1.0 cm right axillary node on 25/3, borderline sized. Right paratracheal node measures 1.7 cm on 25/3. Borderline size right hilar node of 1.1 cm on 30/3. Periesophageal adenopathy including at 1.9 cm on 27/3. Necrotic nodal mass within the anterior mediastinum including at 2.4 x 4.4 cm on 34/3. Lungs/Pleura: No pleural fluid. Bullous emphysema, most significant in the apices. Right apical centrally cavitary lung mass measures 4.3 x 3.1 cm on 40/15. 3.2 cm craniocaudal on coronal image 69. Extension or satellite lesion medially including at 9 mm on 49/5. Musculoskeletal: Metallic foreign bodies about the left chest wall. Anterior chest wall subcutaneous edema, especially about the anterior left sternum on 34/3. CT ABDOMEN PELVIS FINDINGS Hepatobiliary: Focal steatosis adjacent the falciform ligament. Too small to characterize 2 mm lesion in segment 2 of the liver. Normal gallbladder, without biliary ductal dilatation. Pancreas: Normal, without mass or ductal dilatation. Spleen: Normal in size, without focal abnormality. Adrenals/Urinary Tract: Normal adrenal glands. Normal kidneys, without hydronephrosis. Normal urinary bladder. Stomach/Bowel: Normal stomach, without  wall thickening. Normal colon, appendix, and terminal ileum. Normal small bowel. Vascular/Lymphatic: Advanced aortic and branch vessel atherosclerosis. No abdominopelvic adenopathy. Reproductive: Normal  prostate. Other: No significant free fluid. No evidence of omental or peritoneal disease. Musculoskeletal: Lumbosacral spondylosis. IMPRESSION: CT CHEST IMPRESSION 1. Right apical lung mass, highly suspicious for primary bronchogenic carcinoma. Given location and morphology, tuberculosis could look similar. 2. Extensive thoracic adenopathy, most consistent with metastatic disease. 3. Aortic atherosclerosis (ICD10-I70.0) and emphysema (ICD10-J43.9). 4. Anterior chest wall subcutaneous edema is nonspecific. Question cellulitis. Consider physical exam correlation. CT ABDOMEN AND PELVIS IMPRESSION No acute process or evidence of metastatic disease in the abdomen or pelvis. Electronically Signed   By: Abigail Miyamoto M.D.   On: 03/30/2019 16:49   Ir US Guide Bx Asp/drain  Result Date: 04/08/2019 INDICATION: 48 year old male with suspected metastatic lung cancer and right supraclavicular lymphadenopathy. He presents for biopsy to confirm tissue diagnosis. EXAM: Ultrasound-guided core biopsy right supraclavicular lymph node MEDICATIONS: None. ANESTHESIA/SEDATION: None.  Performed under local anesthetic only. FLUOROSCOPY TIME:  None. COMPLICATIONS: None immediate. PROCEDURE: Informed written consent was obtained from the patient after a thorough discussion of the procedural risks, benefits and alternatives. All questions were addressed. Maximal Sterile Barrier Technique was utilized including caps, mask, sterile gowns, sterile gloves, sterile drape, hand hygiene and skin antiseptic. A timeout was performed prior to the initiation of the procedure. Ultrasound was used to interrogate the right supraclavicular space. Enlarged, irregular and lobulated right supraclavicular lymph node identified measuring 3.2 x 2.0 cm. A suitable skin entry site was selected and marked. Local anesthesia was attained by infiltration with 1% lidocaine. A small dermatotomy was made. Under real-time ultrasound guidance, multiple 18 gauge core  biopsies were obtained coaxially using the [employer] mission automated biopsy device. Biopsy specimens were placed in formalin and delivered to pathology for further analysis. There was no immediate complication. IMPRESSION: Successful ultrasound-guided core biopsy of right supraclavicular lymphadenopathy. Electronically Signed   By: Jacqulynn Cadet M.D.   On: 04/08/2019 11:08    ASSESSMENT: This is a very pleasant 48 years old African-American male with at least a stage IIIb (T2b, N3, M0) non-small cell lung cancer, adenocarcinoma presented with right upper lobe cavitary lesion in addition to extensive right hilar, mediastinal and bilateral supraclavicular lymphadenopathy diagnosed in September 2020.   PLAN: I had a lengthy discussion with the patient today about his current disease stage, prognosis and treatment options.  I personally and independently reviewed his scan images and discussed the results with the patient today. I recommended for the patient to complete the staging work-up and he is scheduled to have MRI of the brain as well as PET scan tomorrow. I requested his tissue block to be sent to [facility] for molecular studies and PDL 1 expression. I discussed with the patient his treatment options and recommended for him a course of concurrent chemoradiation with weekly carboplatin for AUC of 2 and paclitaxel 45 mg/M2. I discussed with the patient the adverse effect of this treatment including but not limited to alopecia, myelosuppression, nausea and vomiting, peripheral neuropathy, liver or renal dysfunction. He is expected to start the first dose of this treatment on April 26, 2019. The patient will see radiation oncology later today for evaluation and discussion of the radiotherapy option. He will have a chemotherapy education class before the first dose of his treatment. We will arrange for the patient to come back for follow-up visit in 1 week after the start of his treatment for  management of  any adverse effect of his treatment. For pain management, he is receiving his pain medication from his primary care physician.  I will give him refill of the muscle relaxant as well as nausea medicine. He was advised to call immediately if he has any concerning symptoms in the interval. The patient voices understanding of current disease status and treatment options and is in agreement with the current care plan.  All questions were answered. The patient knows to call the clinic with any problems, questions or concerns. We can certainly see the patient much sooner if necessary.  Thank you so much for allowing me to participate in the care of Justin Randall. I will continue to follow up the patient with you and assist in his care.  I spent 55 minutes counseling the patient face to face. The total time spent in the appointment was 80 minutes.  Disclaimer: This note was dictated with voice recognition software. Similar sounding words can inadvertently be transcribed and may not be corrected upon review.   Eilleen Kempf April 15, 2019, 3:41 PM

## 2019-04-16 ENCOUNTER — Telehealth: Payer: Self-pay | Admitting: Pulmonary Disease

## 2019-04-16 ENCOUNTER — Ambulatory Visit (HOSPITAL_COMMUNITY): Admission: RE | Admit: 2019-04-16 | Payer: Medicare Other | Source: Ambulatory Visit

## 2019-04-16 ENCOUNTER — Encounter (HOSPITAL_COMMUNITY): Payer: Medicare Other

## 2019-04-16 ENCOUNTER — Telehealth: Payer: Self-pay | Admitting: Medical Oncology

## 2019-04-16 DIAGNOSIS — C3490 Malignant neoplasm of unspecified part of unspecified bronchus or lung: Secondary | ICD-10-CM

## 2019-04-16 NOTE — Telephone Encounter (Signed)
PCCM: Thanks for keeping [place] informed.  Garner Nash, DO [facility] 04/16/2019 2:31 PM

## 2019-04-16 NOTE — Telephone Encounter (Signed)
Pt cancelled scans today "feel woozy and not eating" he feels worse and will not be able to drive today. Scans are rescheduled.

## 2019-04-16 NOTE — Telephone Encounter (Signed)
PCCM:  Thanks for correcting it Jess. I appreciate it.   If this is an issue there shouldn't be the option in Epic to order such study.   Garner Nash, DO [facility] 04/16/2019 2:29 PM

## 2019-04-16 NOTE — Telephone Encounter (Signed)
Justin Randall with [facility] called again. Spoke with Justin Randall who is requesting the MRI Brain order be changed from WITH contrast to W/WO.  Per Justin Randall, the radiologist stated that the only time you schedule MRI Brain with contrast only is if it's an arthrogram or the pt has had a MRI Brain without contrast in the last 30 days.  Patient is scheduled for this afternoon.  Order has been changed with same diagnoses as previous order.  Will sign and route to Dr Valeta Harms to make him aware of the above.

## 2019-04-16 NOTE — Telephone Encounter (Signed)
Elberta Fortis, Radiology Dept, is calling about MRI order. Cb is [phone]

## 2019-04-16 NOTE — Telephone Encounter (Signed)
LMTCB x1 for [place].

## 2019-04-16 NOTE — Telephone Encounter (Signed)
Call returned to central scheduling, per radiologists, he states the patient cancelled his appt for the MRI and they wanted to let us know.   Call made to patient, he states he r/s the appt. He states he was too sick today and did not feel well. He states he is serious about his health and he will be sure to keep [place] updated. Voiced understanding. I made him aware that we would continue to monitor his care and if he needed [place] to call. Voiced understanding.  Nothing further needed at this time.

## 2019-04-19 ENCOUNTER — Telehealth: Payer: Self-pay | Admitting: Internal Medicine

## 2019-04-19 ENCOUNTER — Telehealth: Payer: Self-pay | Admitting: *Deleted

## 2019-04-19 NOTE — Telephone Encounter (Signed)
Appointments complete per 9/10 los. Spoke with patient re appointments and gave next two date/times for 9/17 and 9/21. Patient will get updated schedule at 9/17 visit.

## 2019-04-19 NOTE — Telephone Encounter (Signed)
Oncology Nurse Navigator Documentation  Oncology Nurse Navigator Flowsheets 04/19/2019  Navigator Location [facility]  Referral Date to RadOnc/MedOnc -  Navigator Encounter Type Letter/Fax/Email;Telephone  Telephone Outgoing Call/I was updated that Justin Randall was unable to go to his MRI Brain.  I contacted him to make sure he was ok.  He states he is feeling better.  I called central scheduling and re-scheduled his MRI brain and called him with an update. He verbalized understanding of appt time and place. I will send him a map in the mail of MRI time, date, and location. He was thankful for the help. I will update Rad Onc on delay on scan.   Multidisiplinary Clinic Date -  Patient Visit Type -  Treatment Phase Pre-Tx/Tx Discussion  Barriers/Navigation Needs Coordination of Care;Education  Education Other  Interventions Coordination of Care;Education  Coordination of Care Appts  Education Method Verbal;Written  Acuity Level 2  Time Spent with Patient 36

## 2019-04-20 ENCOUNTER — Ambulatory Visit (HOSPITAL_COMMUNITY)
Admission: RE | Admit: 2019-04-20 | Discharge: 2019-04-20 | Disposition: A | Discharge status: Home / Self Care | Payer: Medicare Other | Source: Ambulatory Visit | Attending: Pulmonary Disease | Admitting: Pulmonary Disease

## 2019-04-20 ENCOUNTER — Other Ambulatory Visit: Payer: Self-pay

## 2019-04-20 DIAGNOSIS — R599 Enlarged lymph nodes, unspecified: Secondary | ICD-10-CM | POA: Insufficient documentation

## 2019-04-20 DIAGNOSIS — Z79899 Other long term (current) drug therapy: Secondary | ICD-10-CM | POA: Diagnosis not present

## 2019-04-20 DIAGNOSIS — C3491 Malignant neoplasm of unspecified part of right bronchus or lung: Secondary | ICD-10-CM | POA: Diagnosis not present

## 2019-04-20 DIAGNOSIS — F17210 Nicotine dependence, cigarettes, uncomplicated: Secondary | ICD-10-CM | POA: Diagnosis not present

## 2019-04-20 DIAGNOSIS — C3490 Malignant neoplasm of unspecified part of unspecified bronchus or lung: Secondary | ICD-10-CM

## 2019-04-20 LAB — GLUCOSE, CAPILLARY: Glucose-Capillary: 99 mg/dL (ref 70–99)

## 2019-04-20 MED ORDER — FLUDEOXYGLUCOSE F - 18 (FDG) INJECTION
8.7400 | Freq: Once | INTRAVENOUS | Status: AC | PRN
  Administered 2019-04-20: 8.74 via INTRAVENOUS

## 2019-04-22 ENCOUNTER — Encounter: Payer: Self-pay | Admitting: Internal Medicine

## 2019-04-22 ENCOUNTER — Encounter (HOSPITAL_COMMUNITY): Payer: Self-pay | Admitting: Internal Medicine

## 2019-04-22 ENCOUNTER — Other Ambulatory Visit: Payer: Self-pay | Admitting: Internal Medicine

## 2019-04-22 ENCOUNTER — Other Ambulatory Visit: Payer: Self-pay

## 2019-04-22 ENCOUNTER — Inpatient Hospital Stay: Payer: Medicare Other

## 2019-04-22 NOTE — Progress Notes (Signed)
Met with patient at registration to introduce myself as Arboriculturist and to offer available resources.  Discussed one-time $79 Engineer, drilling to assist with personal expenses while going through treatment. He verbalized understanding.  He has my card if interested in applying and for any additional financial questions or concerns.

## 2019-04-23 ENCOUNTER — Telehealth: Payer: Self-pay | Admitting: Pulmonary Disease

## 2019-04-23 DIAGNOSIS — G893 Neoplasm related pain (acute) (chronic): Secondary | ICD-10-CM

## 2019-04-23 MED ORDER — OXYCODONE-ACETAMINOPHEN 10-325 MG PO TABS: 1.0000 | ORAL_TABLET | Freq: Four times a day (QID) | ORAL | 0 refills | Status: DC | PRN

## 2019-04-23 NOTE — Telephone Encounter (Signed)
PCCM:  I have spoke with Dr. Maryjean Ka who will call the patient next week to setup an appointment with interventional pain.   [place], can you call him and let him know that Dr. Maryjean Ka office will be contacting him.  Garner Nash, DO [facility] 04/23/2019 5:38 PM

## 2019-04-23 NOTE — Telephone Encounter (Signed)
Call returned to patient, made aware of BI recommendations. Aware medication has been sent in. Appointment made for next week. Instructed to bring in all pain medications. Voiced understanding. Nothing further needed at this time.

## 2019-04-23 NOTE — Telephone Encounter (Signed)
Thanks for all you do   Aaron Edelman

## 2019-04-23 NOTE — Telephone Encounter (Signed)
PCCM:  Patient still with acute pain. Mets to the neck.   I will contact and discuss case with neuro/interventional pain management to see if they could help with a potential procedure.   He needs an appointment with BM next week for a pill count and discuss longterm opiate needs   I called and spoke with BM about this already.   I will refill for this time but he needs to understand we must have an opiate strategy.   Thanks  Garner Nash, DO [facility] 04/23/2019 2:47 PM

## 2019-04-23 NOTE — Telephone Encounter (Signed)
Call returned to patient, he is requesting a refill on the oxycodone. He reports his oncologist suggested he double up on his dose. He reports he took the two doses and he was better able to function. He states he did not know whether he wouldl be able to   He reports he has 10 left. He started chemo Monday and he is in a lot more pain.   BI please advise, patient is requesting refill of oxycodone. Filled 09/10, has 10 pills left due to doubling his dose following chemo.

## 2019-04-23 NOTE — Telephone Encounter (Signed)
Noted. Discussed. Agreed. Needs OV next week, please also instruct patient to bring pain medication with him to appt.   Wyn Quaker FNP

## 2019-04-26 ENCOUNTER — Telehealth: Payer: Self-pay | Admitting: *Deleted

## 2019-04-26 ENCOUNTER — Emergency Department (HOSPITAL_COMMUNITY)
Admission: EM | Admit: 2019-04-26 | Discharge: 2019-04-26 | Disposition: A | Discharge status: Home / Self Care | Payer: Medicare Other | Attending: Emergency Medicine | Admitting: Emergency Medicine

## 2019-04-26 ENCOUNTER — Encounter (HOSPITAL_COMMUNITY): Payer: Self-pay | Admitting: Family Medicine

## 2019-04-26 ENCOUNTER — Inpatient Hospital Stay: Payer: Medicare Other

## 2019-04-26 ENCOUNTER — Ambulatory Visit (INDEPENDENT_AMBULATORY_CARE_PROVIDER_SITE_OTHER)
Admission: EM | Admit: 2019-04-26 | Discharge: 2019-04-26 | Disposition: A | Discharge status: Home / Self Care | Payer: Medicare Other | Source: Home / Self Care

## 2019-04-26 ENCOUNTER — Encounter (HOSPITAL_COMMUNITY): Payer: Self-pay

## 2019-04-26 ENCOUNTER — Other Ambulatory Visit: Payer: Self-pay

## 2019-04-26 DIAGNOSIS — G4700 Insomnia, unspecified: Secondary | ICD-10-CM | POA: Diagnosis not present

## 2019-04-26 DIAGNOSIS — F17200 Nicotine dependence, unspecified, uncomplicated: Secondary | ICD-10-CM | POA: Diagnosis not present

## 2019-04-26 DIAGNOSIS — F419 Anxiety disorder, unspecified: Secondary | ICD-10-CM | POA: Insufficient documentation

## 2019-04-26 DIAGNOSIS — M25511 Pain in right shoulder: Secondary | ICD-10-CM | POA: Diagnosis not present

## 2019-04-26 DIAGNOSIS — R000 Tachycardia, unspecified: Secondary | ICD-10-CM | POA: Insufficient documentation

## 2019-04-26 DIAGNOSIS — C3491 Malignant neoplasm of unspecified part of right bronchus or lung: Secondary | ICD-10-CM | POA: Diagnosis not present

## 2019-04-26 DIAGNOSIS — M542 Cervicalgia: Secondary | ICD-10-CM | POA: Diagnosis present

## 2019-04-26 DIAGNOSIS — Z85118 Personal history of other malignant neoplasm of bronchus and lung: Secondary | ICD-10-CM | POA: Insufficient documentation

## 2019-04-26 MED ORDER — FENTANYL CITRATE (PF) 100 MCG/2ML IJ SOLN
50.0000 ug | Freq: Once | INTRAMUSCULAR | Status: AC
  Administered 2019-04-26: 11:00:00 50 ug via INTRAVENOUS
  Filled 2019-04-26: qty 2

## 2019-04-26 MED ORDER — FENTANYL 25 MCG/HR TD PT72
1.0000 | MEDICATED_PATCH | TRANSDERMAL | Status: DC
  Administered 2019-04-26: 13:00:00 1 via TRANSDERMAL
  Filled 2019-04-26: qty 1

## 2019-04-26 MED ORDER — SODIUM CHLORIDE 0.9 % IV BOLUS
1000.0000 mL | Freq: Once | INTRAVENOUS | Status: AC
  Administered 2019-04-26: 1000 mL via INTRAVENOUS

## 2019-04-26 NOTE — Telephone Encounter (Signed)
LMTCB

## 2019-04-26 NOTE — ED Provider Notes (Signed)
[facility] DEPT Provider Note   CSN: [id number] Arrival date & time: 04/26/19  0932     History   Chief Complaint Chief Complaint  Patient presents with   Neck Pain    HPI Justin Randall is a 48 y.o. male.     HPI Patient presents with concern of neck pain, insomnia, anxiety. Notably, the patient has a history of anxiety, but recently was also diagnosed with metastatic lung cancer. He was scheduled to begin chemotherapy today. However, over the weekend, he has been unable to sleep, has had uncontrolled pain in spite of taking increasing doses of oxycodone.  Now the pain is sharp, severe, throughout the right neck, right upper shoulder. Pain is worse with motion, or attempting to rest and right lateral decubitus position. No new fever, confusion, chills, dyspnea, nausea, vomiting. Today he went to urgent care due to his intractable pain, was sent here for further evaluation. 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 Authorizing Provider  acetaminophen (TYLENOL) 325 MG tablet Take 650 mg by mouth every 6 (six) hours as needed for mild pain or headache.    Yes [provider]  prochlorperazine (COMPAZINE) 10 MG tablet TAKE 1 TABLET(10 MG) BY MOUTH EVERY 6 HOURS AS NEEDED  FOR NAUSEA OR VOMITING Patient taking differently: Take 10 mg by mouth every 6 (six) hours as needed for nausea or vomiting.  04/22/19  Yes Curt Bears, MD  tiZANidine (ZANAFLEX) 4 MG tablet TAKE 1 TABLET(4 MG) BY MOUTH EVERY 8 HOURS AS NEEDED FOR MUSCLE SPASMS Patient taking differently: Take 4 mg by mouth every 8 (eight) hours as needed for muscle spasms.  04/22/19  Yes Curt Bears, MD  ibuprofen (ADVIL) 600 MG tablet Take 1 tablet (600 mg total) by mouth every 6 (six) hours as needed. Patient not taking: Reported on 04/26/2019 03/22/19   Wieters, Elesa Hacker, PA-C  oxyCODONE-acetaminophen (PERCOCET) 10-325 MG tablet Take 1 tablet by mouth every 6 (six) hours as needed for pain (cancer related pain). 04/23/19 05/23/19  Garner Nash, DO  DULoxetine (CYMBALTA) 30 MG capsule Take 1 capsule (30 mg total) by mouth daily. 12/05/17 03/22/19  [name], MD    Family History Family History  Problem Relation Age of Onset   Healthy Mother     Social History Social History   Tobacco Use   Smoking status: Current Every Day Smoker    Packs/day: 1.00    Years: 30.00    Pack years: 30.00   Smokeless tobacco: Never Used  Substance Use Topics   Alcohol use: No   Drug use: Yes    Types: Marijuana     Allergies   Oxycodone   Review of Systems Review of Systems  Constitutional:  Per HPI, otherwise negative  HENT:       Per HPI, otherwise negative  Respiratory:       Per HPI, otherwise negative  Cardiovascular:       Per HPI, otherwise negative  Gastrointestinal: Negative for vomiting.  Endocrine:       Negative aside from HPI  Genitourinary:       Neg aside from HPI   Musculoskeletal:       Per HPI, otherwise negative  Skin: Positive for color change.  Allergic/Immunologic: Positive for immunocompromised state.  Neurological: Negative for syncope.  Hematological: Positive for adenopathy.  Psychiatric/Behavioral: The patient is nervous/anxious.       Physical Exam Updated Vital Signs BP 126/82    Pulse 86    Temp 98.6 F (37 C)    Resp 16    Ht 6\' 2"  (1.88 m)    Wt 79.4 kg    SpO2 100%    BMI 22.47 kg/m   Physical Exam Vitals signs and nursing note reviewed.  Constitutional:      Appearance: He is well-developed.     Comments: Uncomfortable appearing adult male awake and alert resting in left lateral decubitus position.  HENT:     Head: Normocephalic and atraumatic.  Eyes:     Conjunctiva/sclera: Conjunctivae normal.  Neck:     Comments: Indurated tissue inferior right lateral neck mild erythema Cardiovascular:     Rate and Rhythm: Regular rhythm. Tachycardia present.  Pulmonary:     Effort: Pulmonary effort is normal. No respiratory distress.     Breath sounds: No stridor.  Abdominal:     General: There is no distension.  Musculoskeletal:     Comments: Patient notes pain in the right anterior superior shoulder/clavicular area with range of motion of the right arm.  Skin:    General: Skin is warm and dry.     Findings: Erythema present.  Neurological:     Mental Status: He is alert and oriented to person, place, and time.  Psychiatric:        Mood and Affect: Mood is anxious.      ED Treatments / Results  Labs (all labs ordered are listed, but only abnormal results are displayed) Labs Reviewed - No data to display  EKG None  Radiology No results found.  Procedures Procedures (including critical care time)  Medications Ordered in ED Medications  sodium chloride 0.9 % bolus 1,000 mL (0 mLs Intravenous Stopped 04/26/19 1206)  fentaNYL (SUBLIMAZE) injection 50 mcg (50 mcg Intravenous Given 04/26/19 1059)     Initial Impression / Assessment and Plan / ED Course  I have reviewed the triage vital signs and the nursing notes.  Pertinent labs & imaging results that were available during my care of the patient were reviewed by me and considered in my medical decision making (see chart for  details).  Clinical Course as of Apr 26 1215  Mon Apr 26, 2019  1134 Patient's pain has improved.  There was a delay in obtaining access, for fluids, analgesia.   [RL]    Clinical Course User Index [RL] Carmin Muskrat, MD   Chart review after initial evaluation notable for recent imaging studies concerning for metastatic malignancy, with recent PET scan results as below: IMPRESSION: 1. Hypermetabolic right upper lobe lung mass is again noted compatible with primary bronchogenic carcinoma. 2. Bilateral mediastinal, bilateral supraclavicular, right subpectoral and right axillary, and right level 2, 3, 4, and 5 cervical hypermetabolic nodal metastasis. 3. Evidence of  right upper chest wall involvement by tumor which is felt to be a direct extension from bulky right supraclavicular adenopathy. 4. Left chest wall involvement is also suspected at the left second costosternal junction. This may be the result of direct extension from left internal mammary nodal metastasis.     Electronically Signed   By: Kerby Moors M.D.   On: 04/20/2019 14:11    Concern for tachycardia, inadequate pain control the patient received IV fluids, analgesia after my evaluation. 12:16 PM Patient substantially better, sitting upright, speaking clearly, pain is well controlled. We discussed his ongoing cancer therapy, and he will follow-up closely with his outpatient providers, for consideration of longer-term fentanyl patch analgesia. Now, with no other new complaints, with well-documented history of cancer, for which she is just embarking on his care, and with pain controlled, no hemodynamic stability, resolved tachycardia, is appropriate for discharge with outpatient follow-up.  Final Clinical Impressions(s) / ED Diagnoses   Final diagnoses:  Neck pain  Tachycardia     Carmin Muskrat, MD 04/26/19 1217

## 2019-04-26 NOTE — ED Notes (Signed)
Patient is not feeling well, requests to lie down a minute, assisted patient to exam bed.  Dr Joseph Art offered ambulance transport, patient readily agreed.

## 2019-04-26 NOTE — ED Triage Notes (Signed)
EMS reports from [facility], sent for increasing neck and shoulder pain since Thursday and unable to sleep. Recent Dx lung cancer.  BP 117/85 HR 101 RR 20 Sp02 96 RA CBG 99

## 2019-04-26 NOTE — Discharge Instructions (Addendum)
As discussed, today's evaluation has been generally reassuring. You have been provided a narcotic pain patch. Additional prescriptions for these should come from either your oncologist or your primary care team.  Please discuss its efficacy when you see your physician tomorrow or the next day, and consider appropriate dosing changes, as needed.  Return here for concerning changes in your condition.

## 2019-04-26 NOTE — Telephone Encounter (Signed)
Access Nurse call center received call from [facility] requesting to cancel today's  Appointments.   Cancelled today's lab, infusion at this time.

## 2019-04-26 NOTE — Discharge Instructions (Signed)
Because of your severe pain and our inability to use our x-ray machine here today, I need to send you to [facility] long emergency.  I called Dr. Earlie Server who said this would be the right thing to do because they can give you stronger medicine so we can provide here and do more evaluation.

## 2019-04-26 NOTE — ED Triage Notes (Signed)
patient has just been diagnosed with cancer.  Patient is complaining of pain in neck and not able to sleep Has called pcp and has an appt tomorrow.  Patient requesting pain medicine and needing something to rest.

## 2019-04-26 NOTE — ED Provider Notes (Addendum)
Justin Randall    CSN: [id number] Arrival date & time: 04/26/19  0803      History   Chief Complaint Chief Complaint  Patient presents with  . Pain    HPI Justin Randall is a 48 y.o. male.   Is a 48 year old patient with lung cancer who has developed right neck pain over the weekend with swelling.  He is been given oxycodone but this did not relieve this pain but instead made him feel "out of it."  He says he has not slept all weekend and is getting desperate.  He was told to have chemotherapy today but he does not feel he can tolerate it.     Past Medical History:  Diagnosis Date  . Anxiety   . Cancer ([place])   . Sciatica   . Ulcer of the stomach and intestine     Patient Active Problem List   Diagnosis Date Noted  . Encounter for antineoplastic chemotherapy 04/15/2019  . Goals of care, counseling/discussion 04/15/2019  . 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 Tobacco dependence 12/05/2017  . Depression 12/05/2017  . 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 IR US GUIDE BX ASP/DRAIN  04/08/2019  . NO PAST SURGERIES         Home Medications    Prior to Admission medications   Medication Sig Start Date End Date Taking? Authorizing Provider  acetaminophen (TYLENOL) 325 MG tablet Take 650 mg by mouth every 6 (six) hours as needed.   Yes [provider]  oxyCODONE-acetaminophen (PERCOCET) 10-325 MG tablet Take 1 tablet by mouth every 6 (six) hours as needed for pain (cancer related pain). 04/23/19 05/23/19 Yes Icard, Octavio Graves, DO  prochlorperazine (COMPAZINE) 10 MG tablet TAKE 1 TABLET(10 MG) BY MOUTH EVERY 6 HOURS AS NEEDED FOR NAUSEA OR VOMITING 04/22/19  Yes Curt Bears, MD  tiZANidine (ZANAFLEX) 4 MG tablet TAKE 1 TABLET(4 MG) BY MOUTH EVERY 8 HOURS AS NEEDED FOR MUSCLE SPASMS 04/22/19  Yes Curt Bears, MD  ibuprofen  (ADVIL) 600 MG tablet Take 1 tablet (600 mg total) by mouth every 6 (six) hours as needed. 03/22/19   Wieters, Hallie C, PA-C  DULoxetine (CYMBALTA) 30 MG capsule Take 1 capsule (30 mg total) by mouth daily. 12/05/17 03/22/19  [name], MD    Family History Family History  Problem Relation Age of Onset  . Healthy Mother     Social History Social History   Tobacco Use  . Smoking status: Current Every Day Smoker    Packs/day: 1.00    Years: 30.00    Pack years: 30.00  . Smokeless tobacco: Never Used  Substance Use Topics  . Alcohol use: No  . Drug use: Yes    Types: Marijuana     Allergies   Oxycodone   Review of Systems Review of Systems  Constitutional: Positive for activity change and appetite change.  Gastrointestinal: Positive for nausea.  All other systems reviewed and are negative.    Physical Exam Triage Vital Signs ED Triage Vitals  Enc Vitals Group     BP      Pulse      Resp      Temp      Temp src      SpO2      Weight      Height      Head Circumference  Peak Flow      Pain Score      Pain Loc      Pain Edu?      Excl. in [place]?    No data found.  Updated Vital Signs BP 117/85 (BP Location: Right Arm)   Pulse (!) 118   Temp 98.8 F (37.1 C) (Oral)   Resp (!) 22   SpO2 96%    Physical Exam Vitals signs and nursing note reviewed.  Constitutional:      General: He is in acute distress.  Eyes:     Conjunctiva/sclera: Conjunctivae normal.  Neck:     Comments: Right neck is swollen with some erythema Cardiovascular:     Rate and Rhythm: Tachycardia present.  Skin:    General: Skin is warm and dry.  Neurological:     Mental Status: He is alert.      UC Treatments / Results  Labs (all labs ordered are listed, but only abnormal results are displayed) Labs Reviewed - No data to display  EKG   Radiology No results found.   IMPRESSION: 1. Multiple enlarged necrotic right greater than left lymph nodes concerning  for metastatic disease. Although the differential diagnosis includes infectious etiologies such as TB or cat scratch disease, malignancy is most likely. 2. Heterogeneous mass infiltrating the right sternocleidomastoid muscle measures up to 3.4 cm. 3. No primary lesion in the neck. 4. Right upper lobe mass lesion is partially imaged. This may represent a primary lung tumor. Recommend CT of the chest, abdomen, and pelvis with contrast for further evaluation. 5. Left supraclavicular and thoracic inlet lymph nodes are present, similar to those on the right. 6. Bilateral upper mediastinal adenopathy. 7. Emphysema (ICD10-J43.9). Centrilobular and paraseptal emphysematous changes are present.  These results were called by telephone at the time of interpretation on 03/28/2019 at 10:04 am to Dr. Eliezer Mccoy , who verbally acknowledged these results.   Electronically Signed   By: San Morelle M.D.   On: 03/28/2019 10:08 Procedures Procedures (including critical care time)  Medications Ordered in UC Medications - No data to display  Initial Impression / Assessment and Plan / UC Course  I have reviewed the triage vital signs and the nursing notes.  Pertinent labs & imaging results that were available during my care of the patient were reviewed by me and considered in my medical decision making (see chart for details).    Final Clinical Impressions(s) / UC Diagnoses   Final diagnoses:  Primary malignant neoplasm of right lung metastatic to other site [facility])     Discharge Instructions     Because of your severe pain and our inability to use our x-ray machine here today, I need to send you to [facility] long emergency.  I called Dr. Earlie Server who said this would be the right thing to do because they can give you stronger medicine so we can provide here and do more evaluation.   Note: I tried to take an x-ray of the patient's neck, but the x-ray machine was not functioning  properly. ED Prescriptions    None     I have reviewed the PDMP during this encounter.   Robyn Haber, MD 04/26/19 [facility]    Robyn Haber, MD 04/26/19 [phone]

## 2019-04-27 ENCOUNTER — Ambulatory Visit (HOSPITAL_COMMUNITY): Admission: RE | Admit: 2019-04-27 | Payer: Medicare Other | Source: Ambulatory Visit

## 2019-04-27 ENCOUNTER — Encounter: Payer: Self-pay | Admitting: Pulmonary Disease

## 2019-04-27 ENCOUNTER — Ambulatory Visit (INDEPENDENT_AMBULATORY_CARE_PROVIDER_SITE_OTHER): Payer: Medicare Other | Admitting: Pulmonary Disease

## 2019-04-27 VITALS — BP 118/84 | HR 82 | Temp 97.0°F | Ht 74.0 in | Wt 164.4 lb

## 2019-04-27 DIAGNOSIS — F17200 Nicotine dependence, unspecified, uncomplicated: Secondary | ICD-10-CM | POA: Diagnosis not present

## 2019-04-27 DIAGNOSIS — G893 Neoplasm related pain (acute) (chronic): Secondary | ICD-10-CM | POA: Diagnosis not present

## 2019-04-27 DIAGNOSIS — C3491 Malignant neoplasm of unspecified part of right bronchus or lung: Secondary | ICD-10-CM | POA: Diagnosis not present

## 2019-04-27 DIAGNOSIS — Z7189 Other specified counseling: Secondary | ICD-10-CM | POA: Diagnosis not present

## 2019-04-27 MED ORDER — FENTANYL 25 MCG/HR TD PT72: 1.0000 | MEDICATED_PATCH | TRANSDERMAL | 0 refills | Status: DC

## 2019-04-27 MED ORDER — TIZANIDINE HCL 4 MG PO TABS: ORAL_TABLET | ORAL | 0 refills | Status: DC

## 2019-04-27 NOTE — Assessment & Plan Note (Signed)
Briefly discussed.  Patient understands that he needs to continue to proceed forward with chemotherapy as well as work with interventional pain for chronic pain management.

## 2019-04-27 NOTE — Telephone Encounter (Signed)
Patient has an appt this AM. Will discuss at visit. Nothing further needed at this time.

## 2019-04-27 NOTE — Assessment & Plan Note (Addendum)
Discussion: Patient prefers using fentanyl patch.  He feels that this is better manage his neck pain.  He also feels that this will actually help him be able to obtain sleep by controlling his pain.  He knows that the fentanyl is not a sleep medication.  He feels that his appetite has improved since using the fentanyl patch versus taking the oxycodone that he had previously been taking p.o.  Based off this discussion as well as patient planning on having close follow-up with interventional pain Dr. Maryjean Ka will provide patient 1 more prescription for an additional patch which she can pick up on 04/29/2019.  I have reviewed this with the patient.  I have also contacted Dr. Maryjean Ka office and left a message for Dr. Maryjean Ka scheduler Oley Balm to get patient scheduled closely as well as to update them that he was recently seen in the emergency room for management of his pain.  Before prescribing the patient with fentanyl patch, I have checked [place] PMP aware and the patients overdose risk score is 320. Patient has 6 providers prescribing controlled substances. Patient has used 2 pharmacies. I have counseled the patient on the sedative effects of fentanyl. Patient to use this medication sparingly and not when driving, drinking alcohol, or using additional sedative medications. Patient has been prescribed 1 patch with no refills.   Plan: Continue fentanyl patch 17mcg, every 72 hours Rotate sites of fentanyl patch when changing Continue Zanaflex as needed for muscular pain Close follow-up with interventional pain Dr. Maryjean Ka

## 2019-04-27 NOTE — Assessment & Plan Note (Signed)
Plan: We recommend stopping smoking

## 2019-04-27 NOTE — Progress Notes (Signed)
@Patient  ID: Justin Randall, male    DOB: Feb 06, 1971, 48 y.o.   MRN: [id number]  Chief Complaint  Patient presents with   Follow-up    Appt for Pain. Seen in ED yesterday. He reports they gave him a pain patch that s helping. He has not started chemo due to the pain and not feeling well.     Referring provider: [name], MD  HPI:  48 year old male current every day smoker followed in our office for malignant neoplasm of his lung  Followed by Dr. Earlie Server JKK:XFGHW IIIb (T2b, N3, M0) non-small cell lung cancer, adenocarcinoma  PMH: Depression, marijuana use Smoker/ Smoking History: Current everyday smoker.  1 pack/day.  30-pack-year smoking history. Maintenance: None Pt of: Dr. Valeta Harms  04/27/2019  - Visit   48 year old current every smoker followed in our office for a Stage IIIb non small cell lung cancer, adenocarcinoma which presented with a right upper lobe cavitary lesion on ct. Pt has been established with Dr. Earlie Server.  There were plans with the patient to start chemotherapy on 04/26/2019.  Unfortunately the patient was in a significant amount of neck pain and was unable to present to this office visit to start chemotherapy.  Patient presented to [facility] long emergency room on 04/26/2019.  Patient was in a significant amount of pain specifically in his neck and right shoulder.  Emergency room physician started patient on fentanyl patch 25 mcg to change every 72 hours.  Patient does not have any refills regarding this.  Dr. Valeta Harms have been previously managing the patient on oxycodone/acetaminophen 10/325.  Patient was recently prescribed a 15-day supply on 04/15/2019.  Patient currently has 5 pills left.  He is all out of his muscle relaxers.  He reports that the pain continues to be severe.  Pain has improved with fentanyl patch being applied.  Neck pain has improved and is down to a 0 out of 10 per patient.  Shoulder pain remains 10 out of 10.  Patient also feels that the fentanyl  patch has helped with his appetite and reducing nausea.  He feels like with the oxycodone/Tylenol he felt more loopy and did not have an appetite.    Tests:   03/30/2019-CT chest with contrast- right apical lung mass, highly suspicious for primary bronchia genic carcinoma, extensive thoracic adenopathy most consistent with metastatic disease, aortic arthrosclerosis  03/30/2019-CT abdomen pelvis with contrast-no acute process or evidence of metastatic disease in abdomen or pelvis  04/08/2019-IR ultrasound-guided biopsy- poorly differentiated adenocarcinoma consistent with lung primary  04/20/2019-PET scan- hypermetabolic right upper lobe lung mass is again noted and compatible for primary bronchogenic carcinoma, bilateral mediastinal bilateral supraclavicular right subpectoral and right axillary and right level 2, 3, 4 and 5 cervical hypermetabolic nodular metastasis, evidence of right upper chest wall involvement by tumor which is felt to be direct extension from bulky right supraclavicular adenopathy    FENO:  No results found for: NITRICOXIDE  PFT: No flowsheet data found.  No flowsheet data found.  Imaging: Ct Chest W Contrast  Result Date: 03/30/2019 CLINICAL DATA:  Cervical and upper thoracic adenopathy on neck CT. Right-sided neck pain and swelling. EXAM: CT CHEST, ABDOMEN, AND PELVIS WITH CONTRAST TECHNIQUE: Multidetector CT imaging of the chest, abdomen and pelvis was performed following the standard protocol during bolus administration of intravenous contrast. CONTRAST:  146mL OMNIPAQUE IOHEXOL 300 MG/ML  SOLN COMPARISON:  Neck CT 03/28/2019.  Chest radiograph 03/22/2019. FINDINGS: CT CHEST FINDINGS Cardiovascular: Aortic atherosclerosis. Normal heart size,  without pericardial effusion. No central pulmonary embolism, on this non-dedicated study. Mediastinum/Nodes: Supraclavicular adenopathy as detailed on prior dedicated CT. 1.0 cm right axillary node on 25/3, borderline sized. Right  paratracheal node measures 1.7 cm on 25/3. Borderline size right hilar node of 1.1 cm on 30/3. Periesophageal adenopathy including at 1.9 cm on 27/3. Necrotic nodal mass within the anterior mediastinum including at 2.4 x 4.4 cm on 34/3. Lungs/Pleura: No pleural fluid. Bullous emphysema, most significant in the apices. Right apical centrally cavitary lung mass measures 4.3 x 3.1 cm on 40/15. 3.2 cm craniocaudal on coronal image 69. Extension or satellite lesion medially including at 9 mm on 49/5. Musculoskeletal: Metallic foreign bodies about the left chest wall. Anterior chest wall subcutaneous edema, especially about the anterior left sternum on 34/3. CT ABDOMEN PELVIS FINDINGS Hepatobiliary: Focal steatosis adjacent the falciform ligament. Too small to characterize 2 mm lesion in segment 2 of the liver. Normal gallbladder, without biliary ductal dilatation. Pancreas: Normal, without mass or ductal dilatation. Spleen: Normal in size, without focal abnormality. Adrenals/Urinary Tract: Normal adrenal glands. Normal kidneys, without hydronephrosis. Normal urinary bladder. Stomach/Bowel: Normal stomach, without wall thickening. Normal colon, appendix, and terminal ileum. Normal small bowel. Vascular/Lymphatic: Advanced aortic and branch vessel atherosclerosis. No abdominopelvic adenopathy. Reproductive: Normal prostate. Other: No significant free fluid. No evidence of omental or peritoneal disease. Musculoskeletal: Lumbosacral spondylosis. IMPRESSION: CT CHEST IMPRESSION 1. Right apical lung mass, highly suspicious for primary bronchogenic carcinoma. Given location and morphology, tuberculosis could look similar. 2. Extensive thoracic adenopathy, most consistent with metastatic disease. 3. Aortic atherosclerosis (ICD10-I70.0) and emphysema (ICD10-J43.9). 4. Anterior chest wall subcutaneous edema is nonspecific. Question cellulitis. Consider physical exam correlation. CT ABDOMEN AND PELVIS IMPRESSION No acute process or  evidence of metastatic disease in the abdomen or pelvis. Electronically Signed   By: Abigail Miyamoto M.D.   On: 03/30/2019 16:49   Ct Abdomen Pelvis W Contrast  Result Date: 03/30/2019 CLINICAL DATA:  Cervical and upper thoracic adenopathy on neck CT. Right-sided neck pain and swelling. EXAM: CT CHEST, ABDOMEN, AND PELVIS WITH CONTRAST TECHNIQUE: Multidetector CT imaging of the chest, abdomen and pelvis was performed following the standard protocol during bolus administration of intravenous contrast. CONTRAST:  159mL OMNIPAQUE IOHEXOL 300 MG/ML  SOLN COMPARISON:  Neck CT 03/28/2019.  Chest radiograph 03/22/2019. FINDINGS: CT CHEST FINDINGS Cardiovascular: Aortic atherosclerosis. Normal heart size, without pericardial effusion. No central pulmonary embolism, on this non-dedicated study. Mediastinum/Nodes: Supraclavicular adenopathy as detailed on prior dedicated CT. 1.0 cm right axillary node on 25/3, borderline sized. Right paratracheal node measures 1.7 cm on 25/3. Borderline size right hilar node of 1.1 cm on 30/3. Periesophageal adenopathy including at 1.9 cm on 27/3. Necrotic nodal mass within the anterior mediastinum including at 2.4 x 4.4 cm on 34/3. Lungs/Pleura: No pleural fluid. Bullous emphysema, most significant in the apices. Right apical centrally cavitary lung mass measures 4.3 x 3.1 cm on 40/15. 3.2 cm craniocaudal on coronal image 69. Extension or satellite lesion medially including at 9 mm on 49/5. Musculoskeletal: Metallic foreign bodies about the left chest wall. Anterior chest wall subcutaneous edema, especially about the anterior left sternum on 34/3. CT ABDOMEN PELVIS FINDINGS Hepatobiliary: Focal steatosis adjacent the falciform ligament. Too small to characterize 2 mm lesion in segment 2 of the liver. Normal gallbladder, without biliary ductal dilatation. Pancreas: Normal, without mass or ductal dilatation. Spleen: Normal in size, without focal abnormality. Adrenals/Urinary Tract: Normal  adrenal glands. Normal kidneys, without hydronephrosis. Normal urinary bladder. Stomach/Bowel: Normal stomach, without  wall thickening. Normal colon, appendix, and terminal ileum. Normal small bowel. Vascular/Lymphatic: Advanced aortic and branch vessel atherosclerosis. No abdominopelvic adenopathy. Reproductive: Normal prostate. Other: No significant free fluid. No evidence of omental or peritoneal disease. Musculoskeletal: Lumbosacral spondylosis. IMPRESSION: CT CHEST IMPRESSION 1. Right apical lung mass, highly suspicious for primary bronchogenic carcinoma. Given location and morphology, tuberculosis could look similar. 2. Extensive thoracic adenopathy, most consistent with metastatic disease. 3. Aortic atherosclerosis (ICD10-I70.0) and emphysema (ICD10-J43.9). 4. Anterior chest wall subcutaneous edema is nonspecific. Question cellulitis. Consider physical exam correlation. CT ABDOMEN AND PELVIS IMPRESSION No acute process or evidence of metastatic disease in the abdomen or pelvis. Electronically Signed   By: Abigail Miyamoto M.D.   On: 03/30/2019 16:49   Nm Pet Image Initial (pi) Skull Base To Thigh  Result Date: 04/20/2019 CLINICAL DATA:  Initial treatment strategy for metastatic lung cancer. EXAM: NUCLEAR MEDICINE PET SKULL BASE TO THIGH TECHNIQUE: 8.74 mCi F-18 FDG was injected intravenously. Full-ring PET imaging was performed from the skull base to thigh after the radiotracer. CT data was obtained and used for attenuation correction and anatomic localization. Fasting blood glucose: 99 mg/dl COMPARISON:  CT chest, abdomen and pelvis 03/30/2019 FINDINGS: Mediastinal blood pool activity: SUV max 2.95 Liver activity: SUV max NA NECK: Extensive FDG avid adenopathy is identified within right cervical lymph node chain. Hypermetabolic lymph nodes are identified within the right level 2, 3, 4 and 5 stations. Index right level 2 node measures 1.1 cm within SUV max of 18.47. Index right level 3/4 lymph node measures  2.2 cm short axis and has an SUV max of 20.12. Incidental CT findings: none CHEST: Extensive right supraclavicular hypermetabolic adenopathy identified. Index right supraclavicular lymph node measures 2.9 cm with SUV max of 18.58. Hypermetabolic right retropectoral and right axillary lymph nodes identified. Right axillary node measures 1.9 cm short axis and has an SUV max of 26.4. Multiple left supraclavicular hypermetabolic lymph nodes are identified. Index lymph node measures 1.3 cm with SUV max of 12.59. Hypermetabolic bilateral mediastinal, subcarinal, and posterior mediastinal adenopathy identified: Right pre-vascular node measures 2.8 cm and has an SUV max of 14.1. Index left pre-vascular lymph node measures 1.5 cm with SUV max of 17.02. Subcarinal lymph node measures 1.2 cm within SUV max of 12.14. Posterior mediastinal lymph node between the aorta and esophagus adjacent to the left mainstem bronchus measures 2.3 cm and has an SUV max of 15.35. Right apical lung mass with central cavitation measures 4 cm and has an SUV max of 16.38. Incidental CT findings: Advanced bullous emphysema. Small pericardial effusion. Mild aortic atherosclerosis. ABDOMEN/PELVIS: No abnormal hypermetabolic activity within the liver, pancreas, adrenal glands, or spleen. No hypermetabolic lymph nodes in the abdomen or pelvis. Incidental CT findings: Aortic atherosclerosis. SKELETON: 2 foci of chest wall involvement are identified involving the upper medial aspect of the right pectoralis musculature with SUV max of 11.7. This appears to be a direct extension from right supraclavicular tumor. On the left, there is asymmetric increased uptake along the left internal mammary lymph node chain with extension into the second left costosternal junction. No findings to suggest distant osseous metastatic disease within the axial or proximal appendicular skeleton. Incidental CT findings: none IMPRESSION: 1. Hypermetabolic right upper lobe lung  mass is again noted compatible with primary bronchogenic carcinoma. 2. Bilateral mediastinal, bilateral supraclavicular, right subpectoral and right axillary, and right level 2, 3, 4, and 5 cervical hypermetabolic nodal metastasis. 3. Evidence of right upper chest wall involvement by tumor which is  felt to be a direct extension from bulky right supraclavicular adenopathy. 4. Left chest wall involvement is also suspected at the left second costosternal junction. This may be the result of direct extension from left internal mammary nodal metastasis. Electronically Signed   By: Kerby Moors M.D.   On: 04/20/2019 14:11   Ir US Guide Bx Asp/drain  Result Date: 04/08/2019 INDICATION: 48 year old male with suspected metastatic lung cancer and right supraclavicular lymphadenopathy. He presents for biopsy to confirm tissue diagnosis. EXAM: Ultrasound-guided core biopsy right supraclavicular lymph node MEDICATIONS: None. ANESTHESIA/SEDATION: None.  Performed under local anesthetic only. FLUOROSCOPY TIME:  None. COMPLICATIONS: None immediate. PROCEDURE: Informed written consent was obtained from the patient after a thorough discussion of the procedural risks, benefits and alternatives. All questions were addressed. Maximal Sterile Barrier Technique was utilized including caps, mask, sterile gowns, sterile gloves, sterile drape, hand hygiene and skin antiseptic. A timeout was performed prior to the initiation of the procedure. Ultrasound was used to interrogate the right supraclavicular space. Enlarged, irregular and lobulated right supraclavicular lymph node identified measuring 3.2 x 2.0 cm. A suitable skin entry site was selected and marked. Local anesthesia was attained by infiltration with 1% lidocaine. A small dermatotomy was made. Under real-time ultrasound guidance, multiple 18 gauge core biopsies were obtained coaxially using the [employer] mission automated biopsy device. Biopsy specimens were placed in formalin and  delivered to pathology for further analysis. There was no immediate complication. IMPRESSION: Successful ultrasound-guided core biopsy of right supraclavicular lymphadenopathy. Electronically Signed   By: Jacqulynn Cadet M.D.   On: 04/08/2019 11:08    Lab Results:  CBC    Component Value Date/Time   WBC 13.6 (H) 04/15/2019 1518   WBC 13.8 (H) 04/06/2019 1109   RBC 5.10 04/15/2019 1518   HGB 15.6 04/15/2019 1518   HCT 45.7 04/15/2019 1518   PLT 260 04/15/2019 1518   MCV 89.6 04/15/2019 1518   MCH 30.6 04/15/2019 1518   MCHC 34.1 04/15/2019 1518   RDW 13.9 04/15/2019 1518   LYMPHSABS 3.5 04/15/2019 1518   MONOABS 1.1 (H) 04/15/2019 1518   EOSABS 0.1 04/15/2019 1518   BASOSABS 0.1 04/15/2019 1518    BMET    Component Value Date/Time   NA 141 04/15/2019 1518   K 3.9 04/15/2019 1518   CL 104 04/15/2019 1518   CO2 28 04/15/2019 1518   GLUCOSE 93 04/15/2019 1518   BUN 9 04/15/2019 1518   CREATININE 0.98 04/15/2019 1518   CALCIUM 9.9 04/15/2019 1518   GFRNONAA >60 04/15/2019 1518   GFRAA >60 04/15/2019 1518    BNP No results found for: BNP  ProBNP No results found for: PROBNP  Specialty Problems      Pulmonary Problems   Adenocarcinoma, lung, right (HCC)      Allergies  Allergen Reactions   Oxycodone     Nausea, "not myself"     There is no immunization history on file for this patient.  Past Medical History:  Diagnosis Date   Anxiety    Cancer ([place])    Sciatica    Ulcer of the stomach and intestine     Tobacco History: Social History   Tobacco Use  Smoking Status Current Every Day Smoker   Packs/day: 1.00   Years: 30.00   Pack years: 30.00  Smokeless Tobacco Never Used   Ready to quit: No Counseling given: Yes  Smoking assessment and cessation counseling  Patient currently smoking: 1 ppd I have advised the patient to quit/stop smoking  as soon as possible due to high risk for multiple medical problems.  It will also be very  difficult for [place] to manage patient's  respiratory symptoms and status if we continue to expose her lungs to a known irritant.  We do not advise e-cigarettes as a form of stopping smoking.  Patient  is not willing to quit smoking.  I have advised the patient that we can assist and have options of nicotine replacement therapy, provided smoking cessation education today, provided smoking cessation counseling, and provided cessation resources.  Follow-up next office visit office visit for assessment of smoking cessation.    Smoking cessation counseling advised for: 4 min    Outpatient Encounter Medications as of 04/27/2019  Medication Sig   acetaminophen (TYLENOL) 325 MG tablet Take 650 mg by mouth every 6 (six) hours as needed for mild pain or headache.    [START ON 04/29/2019] fentaNYL (DURAGESIC) 25 MCG/HR Place 1 patch onto the skin every 3 (three) days. Rotate sites every application   ibuprofen (ADVIL) 600 MG tablet Take 1 tablet (600 mg total) by mouth every 6 (six) hours as needed.   oxyCODONE-acetaminophen (PERCOCET) 10-325 MG tablet Take 1 tablet by mouth every 6 (six) hours as needed for pain (cancer related pain).   prochlorperazine (COMPAZINE) 10 MG tablet TAKE 1 TABLET(10 MG) BY MOUTH EVERY 6 HOURS AS NEEDED FOR NAUSEA OR VOMITING (Patient taking differently: Take 10 mg by mouth every 6 (six) hours as needed for nausea or vomiting. )   tiZANidine (ZANAFLEX) 4 MG tablet TAKE 1 TABLET(4 MG) BY MOUTH EVERY 8 HOURS AS NEEDED FOR MUSCLE SPASMS   [DISCONTINUED] fentaNYL (DURAGESIC) 25 MCG/HR Place 1 patch onto the skin every 3 (three) days. Rotate sites every application   [DISCONTINUED] tiZANidine (ZANAFLEX) 4 MG tablet TAKE 1 TABLET(4 MG) BY MOUTH EVERY 8 HOURS AS NEEDED FOR MUSCLE SPASMS (Patient taking differently: Take 4 mg by mouth every 8 (eight) hours as needed for muscle spasms. )   [DISCONTINUED] DULoxetine (CYMBALTA) 30 MG capsule Take 1 capsule (30 mg total) by mouth  daily.   No facility-administered encounter medications on file as of 04/27/2019.     Review of Systems  Review of Systems  Constitutional: Positive for appetite change (Poor appetite when using oxycodone and Tylenol) and fatigue. Negative for activity change, chills, fever and unexpected weight change.  HENT: Negative for postnasal drip, rhinorrhea, sinus pressure, sinus pain and sore throat.   Eyes: Negative.   Respiratory: Negative for cough, shortness of breath and wheezing.   Cardiovascular: Negative for chest pain and palpitations.  Gastrointestinal: Positive for nausea. Negative for constipation, diarrhea and vomiting.  Endocrine: Negative.   Genitourinary: Negative.   Musculoskeletal: Negative.        Shoulder pain   Skin: Negative.   Neurological: Negative for dizziness and headaches.  Psychiatric/Behavioral: Positive for dysphoric mood and sleep disturbance. The patient is nervous/anxious.   All other systems reviewed and are negative.    Physical Exam  BP 118/84    Pulse 82    Temp (!) 97 F (36.1 C) (Temporal)    Ht 6\' 2"  (1.88 m)    Wt 164 lb 6.4 oz (74.6 kg)    SpO2 100%    BMI 21.11 kg/m   Wt Readings from Last 5 Encounters:  04/27/19 164 lb 6.4 oz (74.6 kg)  04/26/19 175 lb (79.4 kg)  04/15/19 174 lb 8 oz (79.2 kg)  04/07/19 175 lb 6.4 oz (79.6 kg)  04/06/19  174 lb 6.4 oz (79.1 kg)    BMI Readings from Last 5 Encounters:  04/27/19 21.11 kg/m  04/26/19 22.47 kg/m  04/15/19 22.40 kg/m  04/07/19 22.52 kg/m  04/06/19 22.39 kg/m    Physical Exam Vitals signs and nursing note reviewed.  Constitutional:      General: He is in acute distress (pain).     Appearance: Normal appearance. He is normal weight. He is ill-appearing.  HENT:     Head: Normocephalic and atraumatic.     Right Ear: Hearing and external ear normal.     Left Ear: Hearing and external ear normal.     Mouth/Throat:     Mouth: Mucous membranes are dry.  Eyes:     Pupils: Pupils are  equal, round, and reactive to light.  Neck:     Musculoskeletal: Normal range of motion.  Cardiovascular:     Rate and Rhythm: Normal rate and regular rhythm.     Pulses: Normal pulses.     Heart sounds: Normal heart sounds. No murmur.  Pulmonary:     Effort: Pulmonary effort is normal.     Breath sounds: Normal breath sounds. No decreased breath sounds, wheezing or rales.  Musculoskeletal:     Right lower leg: No edema.     Left lower leg: No edema.  Lymphadenopathy:     Cervical: No cervical adenopathy.  Skin:    General: Skin is warm and dry.     Capillary Refill: Capillary refill takes less than 2 seconds.     Findings: No erythema or rash.  Neurological:     General: No focal deficit present.     Mental Status: He is alert and oriented to person, place, and time.     Motor: No weakness.     Coordination: Coordination normal.     Gait: Gait abnormal.  Psychiatric:        Mood and Affect: Mood normal.        Behavior: Behavior normal. Behavior is cooperative.        Thought Content: Thought content normal.        Judgment: Judgment normal.      Assessment & Plan:   Cancer related pain Discussion: Patient prefers using fentanyl patch.  He feels that this is better manage his neck pain.  He also feels that this will actually help him be able to obtain sleep by controlling his pain.  He knows that the fentanyl is not a sleep medication.  He feels that his appetite has improved since using the fentanyl patch versus taking the oxycodone that he had previously been taking p.o.  Based off this discussion as well as patient planning on having close follow-up with interventional pain Dr. Maryjean Ka will provide patient 1 more prescription for an additional patch which she can pick up on 04/29/2019.  I have reviewed this with the patient.  I have also contacted Dr. Maryjean Ka office and left a message for Dr. Maryjean Ka scheduler Oley Balm to get patient scheduled closely as well as to update  them that he was recently seen in the emergency room for management of his pain.  Before prescribing the patient with fentanyl patch, I have checked [place] PMP aware and the patients overdose risk score is 320. Patient has 6 providers prescribing controlled substances. Patient has used 2 pharmacies. I have counseled the patient on the sedative effects of fentanyl. Patient to use this medication sparingly and not when driving, drinking alcohol, or using additional sedative medications. Patient  has been prescribed 1 patch with no refills.   Plan: Continue fentanyl patch 41mcg, every 72 hours Rotate sites of fentanyl patch when changing Continue Zanaflex as needed for muscular pain Close follow-up with interventional pain Dr. Maryjean Ka  Goals of care, counseling/discussion Briefly discussed.  Patient understands that he needs to continue to proceed forward with chemotherapy as well as work with interventional pain for chronic pain management.  Tobacco dependence Plan: We recommend stopping smoking     Return in about 2 weeks (around 05/11/2019), or if symptoms worsen or fail to improve, for Follow up with Dr. Valeta Harms, Follow up with Wyn Quaker FNP-C.   Lauraine Rinne, NP 04/27/2019   This appointment was 28 minutes long with over 50% of the time in direct face-to-face patient care, assessment, plan of care, and follow-up.

## 2019-04-27 NOTE — Telephone Encounter (Signed)
We will call him today and get him scheduled.

## 2019-04-27 NOTE — Patient Instructions (Addendum)
You were seen today by Lauraine Rinne, NP  for:   1. Cancer related pain  - tiZANidine (ZANAFLEX) 4 MG tablet; TAKE 1 TABLET(4 MG) BY MOUTH EVERY 8 HOURS AS NEEDED FOR MUSCLE SPASMS  Dispense: 20 tablet; Refill: 0 - fentaNYL (DURAGESIC) 25 MCG/HR; Place 1 patch onto the skin every 3 (three) days. Rotate sites every application  Dispense: 1 patch; Refill: 0  We have contacted Dr. Maryjean Ka your new pain specialist.  I have left your contact information for Dominique's scheduler to call you to get you scheduled this week to be evaluated by him.  Please keep your phone on you.  If you have not heard from them please contact our office.  2. Tobacco dependence  We recommend that you stop smoking.  >>>You need to set a quit date >>>If you have friends or family who smoke, let them know you are trying to quit and not to smoke around you or in your living environment  Smoking Cessation Resources:  1 800 QUIT NOW  >>> Patient to call this resource and utilize it to help support her quit smoking >>> Keep up your hard work with stopping smoking  You can also contact the [facility] >>>For smoking cessation classes call [phone]  We do not recommend using e-cigarettes as a form of stopping smoking  You can sign up for smoking cessation support texts and information:  >>>https://smokefree.gov/smokefreetxt   3. Adenocarcinoma, lung, right ([place])  Resume follow-up on Monday of next week to start chemotherapy as previously outlined by Dr. Earlie Server   We recommend today:  No orders of the defined types were placed in this encounter.  No orders of the defined types were placed in this encounter.  Meds ordered this encounter  Medications  . tiZANidine (ZANAFLEX) 4 MG tablet    Sig: TAKE 1 TABLET(4 MG) BY MOUTH EVERY 8 HOURS AS NEEDED FOR MUSCLE SPASMS    Dispense:  20 tablet    Refill:  0  . fentaNYL (DURAGESIC) 25 MCG/HR    Sig: Place 1 patch onto the skin every 3 (three)  days. Rotate sites every application    Dispense:  1 patch    Refill:  0    Follow Up:    Return in about 2 weeks (around 05/11/2019), or if symptoms worsen or fail to improve, for Follow up with Dr. Valeta Harms, Follow up with Wyn Quaker FNP-C.   Please do your part to reduce the spread of COVID-19:      Reduce your risk of any infection  and COVID19 by using the similar precautions used for avoiding the common cold or flu:  Marland Kitchen Wash your hands often with soap and warm water for at least 20 seconds.  If soap and water are not readily available, use an alcohol-based hand sanitizer with at least 60% alcohol.  . If coughing or sneezing, cover your mouth and nose by coughing or sneezing into the elbow areas of your shirt or coat, into a tissue or into your sleeve (not your hands). Langley Gauss A MASK when in public  . Avoid shaking hands with others and consider head nods or verbal greetings only. . Avoid touching your eyes, nose, or mouth with unwashed hands.  . Avoid close contact with people who are sick. . Avoid places or events with large numbers of people in one location, like concerts or sporting events. . If you have some symptoms but not all symptoms, continue to monitor at home  and seek medical attention if your symptoms worsen. . If you are having a medical emergency, call 911.   [place] / e-Visit: [email or web address]         [facility]: [facility]: [phone]                   [facility], [facility], [facility]: [phone]     It is flu season:   >>> Best ways to protect herself from the flu: Receive the yearly flu vaccine, practice good hand hygiene washing with soap and also using hand sanitizer when available, eat a nutritious meals, get adequate rest, hydrate appropriately   Please contact the office if your symptoms worsen or you have  concerns that you are not improving.   Thank you for choosing [facility] for your healthcare, and for allowing [place] to partner with you on your healthcare journey. I am thankful to be able to provide care to you today.   Wyn Quaker FNP-C

## 2019-04-27 NOTE — Telephone Encounter (Signed)
Saw patient in office today.  Thank you Dr. Maryjean Ka for being willing to work the patient in.  Patient was placed on a fentanyl patch in the emergency room on 04/26/2019.  He does feel this is helped with management of his neck pain but still is having persistent right shoulder pain.  I have given him 1 more prescription for a 3-day pain patch which he can obtain on 04/29/2019 as I was unsure how quickly he would be able to get him into our office.  I have also refilled his Zanaflex.  I have left a message with Oley Balm your scheduler updating her regarding the patient as well as given her the patient's contact information.  Please let me know if there is anything else that you may need.  Or anything else that I can do to help.  Thank you for working with the patient.Wyn Quaker FNP

## 2019-04-28 ENCOUNTER — Encounter (HOSPITAL_COMMUNITY): Payer: Self-pay | Admitting: Internal Medicine

## 2019-04-28 ENCOUNTER — Telehealth: Payer: Self-pay | Admitting: Pulmonary Disease

## 2019-04-28 ENCOUNTER — Telehealth: Payer: Self-pay | Admitting: Radiation Oncology

## 2019-04-28 ENCOUNTER — Telehealth: Payer: Self-pay | Admitting: *Deleted

## 2019-04-28 DIAGNOSIS — G893 Neoplasm related pain (acute) (chronic): Secondary | ICD-10-CM

## 2019-04-28 NOTE — Telephone Encounter (Addendum)
I called and let Mr. Foiles know his PET imaging results and that his disease is felt to be stage IV. Since he is upstaged, we will plan palliative radiotherapy. He will proceed with simulation this Friday at 8:30 am, and with his MRI at 7pm. He will see medical oncology and start systemic therapy on Monday.      ----- Message from Curt Bears, MD sent at 04/28/2019  1:20 PM EDT ----- He is coming to see [place] next week.  He has a lot of pain in the chest wall and I was hoping that he could start palliative radiotherapy soon.  He has a stage IV lung cancer and his main treatment will be systemic chemotherapy with immunotherapy.  Not sure why he missed his appointment for the MRI.  We will try to call him but you guys can also call him from your office.  Thank you. Mohamed. ----- Message ----- From: Hayden Pedro, PA-C Sent: 04/28/2019   1:08 PM EDT To: Kyung Rudd, MD, Valrie Hart, RN, #  Hinton Dyer- do you know what's going on with Mr. Sweeden? He still hasn't had his MRI, and this I think was the third time it was resecheduled, and it's not on there today. He hasn't come in yet for simulation either, so would have to defer him at least another week if he were to move forward with chemoRT.

## 2019-04-28 NOTE — Telephone Encounter (Signed)
Oncology Nurse Navigator Documentation  Oncology Nurse Navigator Flowsheets 04/28/2019  Navigator Location [facility]  Referral Date to RadOnc/MedOnc -  Navigator Encounter Type Telephone/I received an email from rad onc asking why Mr. kilo eshelman was no completed.  I called Mr. Cerullo and due to his increase pain level, he was unable to show for his appt on 9/22.  I called central scheduling and we got it re-scheduled.  He verbalized understanding of appt time and place. Rad Onc PA Bryson Ha is also concerned why he has not scheduled for rad onc appt.  I don't schedule for her department but can update their scheduling dept.    Telephone Outgoing Call  [facility] Date -  Patient Visit Type -  Treatment Phase Pre-Tx/Tx Discussion  Barriers/Navigation Needs Coordination of Care;Education  Education Other  Interventions Coordination of Care;Education  Coordination of Care Other  Education Method Verbal  Acuity Level 2-Minimal Needs (1-2 Barriers Identified)  Time Spent with Patient 30

## 2019-04-28 NOTE — Telephone Encounter (Signed)
Spoke with pt. States that when he saw Aaron Edelman yesterday he referred him to pain management. His appointment with them isn't until October 26th. He was told to let us know if this happened we would continue filling his Fentanyl until he could be seen.  Aaron Edelman - please advise. Thanks.

## 2019-04-28 NOTE — Telephone Encounter (Signed)
Spoke with pt. He is aware of Brian's response. Will route message back to Aaron Edelman so that he may follow up on this.

## 2019-04-28 NOTE — Telephone Encounter (Signed)
Noted. Will discuss with pain management and dr Valeta Harms. Yes for right now will continue refilling patch. Will discuss management with pain management if they believe there is a bette rootion than a patch then we can reevaluate.   Will follow up on this tomorrow.  Will route to dr Valeta Harms as Justin Randall

## 2019-04-29 ENCOUNTER — Telehealth: Payer: Self-pay | Admitting: Pulmonary Disease

## 2019-04-29 MED ORDER — FENTANYL 25 MCG/HR TD PT72: 1.0000 | MEDICATED_PATCH | TRANSDERMAL | 0 refills | Status: AC

## 2019-04-29 NOTE — Telephone Encounter (Signed)
04/29/2019 1637  I have discussed the patient with Dr. Maryjean Ka as well as with Dr. Earlie Server.  Dr. Earlie Server will take over clinical management of patient's pain when he sees the patient on Monday 05/03/2019.  Dr. Maryjean Ka is recommending maintaining on the fentanyl patch at this time.  Potentially could consider increasing from fentanyl 25 mcg to 37.5 mcg per Dr. Maryjean Ka if clinically indicated.  I contacted the patient discussed this with him.  Will maintain patient on 25 mcg at this time.  I will send another refill for a patch for the patient to be on to pick up on 05/02/2019.  Patient has picked up his new patch today 04/29/2019.  Patient reports that pain management Dr. Maryjean Ka office was able to get him in for an earlier appointment on 05/14/2019.  Routing to [employer] as fyi.   Nothing further needed.   Wyn Quaker, FNP

## 2019-04-29 NOTE — Telephone Encounter (Signed)
Called spoke with patient he states he is going to stop his hydrocodone. The medication is making him feel , not like himself, loss of appetite and always awake can't sleep on it.   Placed as an intolerance for patient.   I let him know that Aaron Edelman has tried to contact a few Md's to help manage his pain. Patient also picked up his patch today.   Nothing further needed at this time.

## 2019-04-29 NOTE — Telephone Encounter (Signed)
Call returned to patient, he states he spoke with someone else earlier today but he wanted to let Dr. Valeta Harms no he can no longer take the hydrocodone. He states she side effects are too much. He reports he does not feel like himself and he rather not keeping taking it. I made him aware I would let Dr. Valeta Harms know and also that someone from pain management should be reaching out to him as well to address his pain needs. Voiced understanding. Nothing further needed at this time.

## 2019-04-29 NOTE — Telephone Encounter (Signed)
Pt returning call and can be reached @ [phone].Justin Randall

## 2019-04-29 NOTE — Telephone Encounter (Signed)
04/29/2019 1056  I have sent a message to Dr. Maryjean Ka to see if he has any specific recommendations prior to the patient establishing with him in October.  I have also sent a message to Dr. Earlie Server to see if oncology would like to further manage the patient's pain until he is established with pain management.  Patient has a refill of his patch which she can obtain today.  We will route to [place] and Dr. Valeta Harms as Juluis Rainier.  We will keep the encounter open for future follow-up  Wyn Quaker, FNP

## 2019-04-30 ENCOUNTER — Ambulatory Visit (HOSPITAL_COMMUNITY): Admission: RE | Admit: 2019-04-30 | Payer: Medicare Other | Source: Ambulatory Visit

## 2019-04-30 ENCOUNTER — Ambulatory Visit
Admission: RE | Admit: 2019-04-30 | Discharge: 2019-04-30 | Disposition: A | Discharge status: Home / Self Care | Payer: Medicare Other | Source: Ambulatory Visit | Attending: Radiation Oncology | Admitting: Radiation Oncology

## 2019-04-30 ENCOUNTER — Telehealth: Payer: Self-pay | Admitting: *Deleted

## 2019-04-30 ENCOUNTER — Other Ambulatory Visit: Payer: Self-pay

## 2019-04-30 DIAGNOSIS — F17210 Nicotine dependence, cigarettes, uncomplicated: Secondary | ICD-10-CM | POA: Diagnosis not present

## 2019-04-30 DIAGNOSIS — C770 Secondary and unspecified malignant neoplasm of lymph nodes of head, face and neck: Secondary | ICD-10-CM

## 2019-04-30 DIAGNOSIS — Z510 Encounter for antineoplastic radiation therapy: Secondary | ICD-10-CM | POA: Diagnosis present

## 2019-04-30 DIAGNOSIS — C3491 Malignant neoplasm of unspecified part of right bronchus or lung: Secondary | ICD-10-CM | POA: Diagnosis not present

## 2019-04-30 NOTE — Telephone Encounter (Signed)
Called patient to inform to call [facility] this afternoon to reschedule cancelled MRI for 04-30-19, lvm for a return call

## 2019-05-01 ENCOUNTER — Other Ambulatory Visit: Payer: Self-pay

## 2019-05-01 ENCOUNTER — Encounter (HOSPITAL_COMMUNITY): Payer: Self-pay

## 2019-05-01 ENCOUNTER — Emergency Department (HOSPITAL_COMMUNITY)
Admission: EM | Admit: 2019-05-01 | Discharge: 2019-05-01 | Disposition: A | Discharge status: Home / Self Care | Payer: Medicare Other | Attending: Emergency Medicine | Admitting: Emergency Medicine

## 2019-05-01 DIAGNOSIS — R630 Anorexia: Secondary | ICD-10-CM

## 2019-05-01 DIAGNOSIS — G479 Sleep disorder, unspecified: Secondary | ICD-10-CM | POA: Insufficient documentation

## 2019-05-01 DIAGNOSIS — F17210 Nicotine dependence, cigarettes, uncomplicated: Secondary | ICD-10-CM | POA: Diagnosis not present

## 2019-05-01 DIAGNOSIS — M542 Cervicalgia: Secondary | ICD-10-CM | POA: Insufficient documentation

## 2019-05-01 DIAGNOSIS — Z79899 Other long term (current) drug therapy: Secondary | ICD-10-CM | POA: Insufficient documentation

## 2019-05-01 DIAGNOSIS — R000 Tachycardia, unspecified: Secondary | ICD-10-CM | POA: Diagnosis not present

## 2019-05-01 DIAGNOSIS — Z85118 Personal history of other malignant neoplasm of bronchus and lung: Secondary | ICD-10-CM | POA: Insufficient documentation

## 2019-05-01 DIAGNOSIS — R590 Localized enlarged lymph nodes: Secondary | ICD-10-CM | POA: Diagnosis not present

## 2019-05-01 LAB — CBC WITH DIFFERENTIAL/PLATELET
Abs Immature Granulocytes: 0.11 10*3/uL — ABNORMAL HIGH (ref 0.00–0.07)
Basophils Absolute: 0.1 10*3/uL (ref 0.0–0.1)
Basophils Relative: 1 %
Eosinophils Absolute: 0.1 10*3/uL (ref 0.0–0.5)
Eosinophils Relative: 0 %
HCT: 52.3 % — ABNORMAL HIGH (ref 39.0–52.0)
Hemoglobin: 17.2 g/dL — ABNORMAL HIGH (ref 13.0–17.0)
Immature Granulocytes: 1 %
Lymphocytes Relative: 15 %
Lymphs Abs: 3.1 10*3/uL (ref 0.7–4.0)
MCH: 30.6 pg (ref 26.0–34.0)
MCHC: 32.9 g/dL (ref 30.0–36.0)
MCV: 93.1 fL (ref 80.0–100.0)
Monocytes Absolute: 1.9 10*3/uL — ABNORMAL HIGH (ref 0.1–1.0)
Monocytes Relative: 9 %
Neutro Abs: 14.8 10*3/uL — ABNORMAL HIGH (ref 1.7–7.7)
Neutrophils Relative %: 74 %
Platelets: 248 10*3/uL (ref 150–400)
RBC: 5.62 MIL/uL (ref 4.22–5.81)
RDW: 14.2 % (ref 11.5–15.5)
WBC: 20.1 10*3/uL — ABNORMAL HIGH (ref 4.0–10.5)
nRBC: 0 % (ref 0.0–0.2)

## 2019-05-01 LAB — BASIC METABOLIC PANEL
Anion gap: 16 — ABNORMAL HIGH (ref 5–15)
BUN: 23 mg/dL — ABNORMAL HIGH (ref 6–20)
CO2: 23 mmol/L (ref 22–32)
Calcium: 10.7 mg/dL — ABNORMAL HIGH (ref 8.9–10.3)
Chloride: 99 mmol/L (ref 98–111)
Creatinine, Ser: 1.07 mg/dL (ref 0.61–1.24)
GFR calc Af Amer: >60 mL/min (ref >60)
GFR calc non Af Amer: >60 mL/min (ref >60)
Glucose, Bld: 77 mg/dL (ref 70–99)
Potassium: 4.7 mmol/L (ref 3.5–5.1)
Sodium: 138 mmol/L (ref 135–145)

## 2019-05-01 MED ORDER — ONDANSETRON HCL 4 MG/2ML IJ SOLN
4.0000 mg | Freq: Once | INTRAMUSCULAR | Status: AC
  Administered 2019-05-01: 4 mg via INTRAVENOUS
  Filled 2019-05-01: qty 2

## 2019-05-01 MED ORDER — ONDANSETRON HCL 4 MG/2ML IJ SOLN: 4.0000 mg | Freq: Once | INTRAMUSCULAR | Status: DC

## 2019-05-01 MED ORDER — MORPHINE SULFATE (PF) 4 MG/ML IV SOLN
4.0000 mg | Freq: Once | INTRAVENOUS | Status: AC
  Administered 2019-05-01: 4 mg via INTRAVENOUS
  Filled 2019-05-01: qty 1

## 2019-05-01 MED ORDER — SODIUM CHLORIDE 0.9 % IV BOLUS
1000.0000 mL | Freq: Once | INTRAVENOUS | Status: AC
  Administered 2019-05-01: 1000 mL via INTRAVENOUS

## 2019-05-01 MED ORDER — MORPHINE SULFATE (PF) 4 MG/ML IV SOLN: 4.0000 mg | Freq: Once | INTRAVENOUS | Status: DC

## 2019-05-01 NOTE — Discharge Instructions (Signed)
As we discussed, you can try melatonin to help with sleeping.  Follow-up with your oncology doctor as directed.  As we discussed, your heart rate was high here in the ED.  This could be due to pain.  If you go home and have any chest pain, difficulty breathing, spike a fever or any other worsening symptoms, you need to return emergency department immediately.

## 2019-05-01 NOTE — Telephone Encounter (Signed)
Thanks for letting me know Garner Nash, DO [facility] 05/01/2019 1:41 PM

## 2019-05-01 NOTE — ED Triage Notes (Signed)
Patient states that he is here to be evaluated for not feeling well and not sleeping. Has been prescribed oxycodone and fentanyl patch with no relief. Complains of pain to neck and shoulder. Alert and oriented, currently not receiving cancer treatment. Scheduled to start chemo on Monday

## 2019-05-01 NOTE — Telephone Encounter (Signed)
PCCM:  Thanks for everyone helping coordinate. I am happy to continue to refill his pain medication needs until radiation treatments and chemo started.  Thanks  Garner Nash, DO [facility] 05/01/2019 1:49 PM

## 2019-05-01 NOTE — ED Provider Notes (Signed)
[facility] EMERGENCY DEPARTMENT Provider Note   CSN: [id number] Arrival date & time: 05/01/19  9798     History   Chief Complaint Chief Complaint  Patient presents with  . cancer pt/ not sleeping    HPI Justin Randall is a 48 y.o. male past 1 history of adenocarcinoma of the right lung, depression, sciatica who presents for evaluation of difficulty eating and sleeping as well as persistent right-sided neck and shoulder pain.  Patient comes in today for evaluation of continued pain to his right neck.  He was seen at [facility] ED on 04/26/2019 for evaluation of same symptoms.  At that time, he was discharged home with oxycodone and a fentanyl patch which he states he has been taken.  He states that he feels like those medications have caused him to not be able to eat and not be able to sleep.  He states he has been able to drink water he states he does not have any appetite.  Denies any abdominal pain, vomiting.  He does report that his last bowel movement was approximately 3 days ago.  He states he still able to swallow denies any difficulty breathing.  Patient states he has not been able to sleep for the last week.  He states that he does not have any pain that prevents him from going to sleep but he just cannot fall asleep. He has tried OTC sleep meds with no improvement.  He has not noted any fevers, chest pain, abdominal pain.  He is scheduled to start radiation and chemotherapy this upcoming week.        The history is provided by the patient.    Past Medical History:  Diagnosis Date  . Anxiety   . Cancer ([place])   . Sciatica   . Ulcer of the stomach and intestine     Patient Active Problem List   Diagnosis Date Noted  . Cancer related pain 04/27/2019  . Encounter for antineoplastic chemotherapy 04/15/2019  . Goals of care, counseling/discussion 04/15/2019  . Adenocarcinoma, lung, right ([place]) 04/13/2019  . Tobacco dependence 12/05/2017  . Depression  12/05/2017  . 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 IR US GUIDE BX ASP/DRAIN  04/08/2019  . NO PAST SURGERIES          Home Medications    Prior to Admission medications   Medication Sig Start Date End Date Taking? Authorizing Provider  acetaminophen (TYLENOL) 325 MG tablet Take 650 mg by mouth every 6 (six) hours as needed for mild pain or headache.     [provider]  fentaNYL (DURAGESIC) 25 MCG/HR Place 1 patch onto the skin every 3 (three) days. Rotate sites every application 04/25/18   Lauraine Rinne, NP  ibuprofen (ADVIL) 600 MG tablet Take 1 tablet (600 mg total) by mouth every 6 (six) hours as needed. 03/22/19   Wieters, Hallie C, PA-C  oxyCODONE-acetaminophen (PERCOCET) 10-325 MG tablet Take 1 tablet by mouth every 6 (six) hours as needed for pain (cancer related pain). 04/23/19 05/23/19  Icard, Octavio Graves, DO  prochlorperazine (COMPAZINE) 10 MG tablet TAKE 1 TABLET(10 MG) BY MOUTH EVERY 6 HOURS AS NEEDED FOR NAUSEA OR VOMITING Patient taking differently: Take 10 mg by mouth every 6 (six) hours as needed for nausea or vomiting.  04/22/19   Curt Bears, MD  tiZANidine (ZANAFLEX) 4 MG tablet TAKE  1 TABLET(4 MG) BY MOUTH EVERY 8 HOURS AS NEEDED FOR MUSCLE SPASMS 04/27/19   Lauraine Rinne, NP  DULoxetine (CYMBALTA) 30 MG capsule Take 1 capsule (30 mg total) by mouth daily. 12/05/17 03/22/19  [name], MD    Family History Family History  Problem Relation Age of Onset  . Healthy Mother     Social History Social History   Tobacco Use  . Smoking status: Current Every Day Smoker    Packs/day: 1.00    Years: 30.00    Pack years: 30.00  . Smokeless tobacco: Never Used  Substance Use Topics  . Alcohol use: No  . Drug use: Yes    Types: Marijuana     Allergies   Hydrocodone and Oxycodone   Review of Systems Review of Systems   Constitutional: Positive for appetite change.  Respiratory: Positive for cough. Negative for shortness of breath.   Cardiovascular: Negative for chest pain.  Gastrointestinal: Negative for abdominal pain, nausea and vomiting.  Genitourinary: Negative for dysuria and hematuria.  Musculoskeletal: Positive for neck pain.       Shoulder pain  Neurological: Negative for headaches.  All other systems reviewed and are negative.    Physical Exam Updated Vital Signs BP 117/90 (BP Location: Left Arm)   Pulse (!) 114   Temp 98.2 F (36.8 C) (Oral)   Resp 15   SpO2 96%   Physical Exam Vitals signs and nursing note reviewed.  Constitutional:      Appearance: Normal appearance. He is well-developed.  HENT:     Head: Normocephalic and atraumatic.  Eyes:     General: Lids are normal.     Conjunctiva/sclera: Conjunctivae normal.     Pupils: Pupils are equal, round, and reactive to light.  Neck:     Musculoskeletal: Full passive range of motion without pain.      Comments: Tenderness palpation of the right paraspinal muscles of the cervical region.  There is overlying duration of the right neck.  No overlying warmth, erythema.  No midline bony tenderness. Cardiovascular:     Rate and Rhythm: Normal rate and regular rhythm.     Pulses: Normal pulses.     Heart sounds: Normal heart sounds. No murmur. No friction rub. No gallop.   Pulmonary:     Effort: Pulmonary effort is normal.     Breath sounds: Normal breath sounds.     Comments: Lungs clear to auscultation bilaterally.  Symmetric chest rise.  No wheezing, rales, rhonchi. Abdominal:     Palpations: Abdomen is soft. Abdomen is not rigid.     Tenderness: There is no abdominal tenderness. There is no guarding.     Comments: Abdomen is soft, non-distended, non-tender. No rigidity, No guarding. No peritoneal signs.  Musculoskeletal: Normal range of motion.     Comments: No bony tenderness noted to right shoulder.  No deformity or crepitus  noted.  No overlying warmth, erythema, edema.  Compartments are soft.  Lymphadenopathy:     Cervical: Cervical adenopathy present.     Right cervical: Superficial cervical adenopathy present.     Upper Body:     Right upper body: Supraclavicular adenopathy present.     Comments: Lymphadenopathy noted to right anterior neck.  Skin:    General: Skin is warm and dry.     Capillary Refill: Capillary refill takes less than 2 seconds.     Comments: Good distal cap refill.  RUE is not dusky in appearance or cool to touch.  Neurological:     Mental Status: He is alert and oriented to person, place, and time.  Psychiatric:        Speech: Speech normal.      ED Treatments / Results  Labs (all labs ordered are listed, but only abnormal results are displayed) Labs Reviewed  CBC WITH DIFFERENTIAL/PLATELET - Abnormal; Notable for the following components:      Result Value   WBC 20.1 (*)    Hemoglobin 17.2 (*)    HCT 52.3 (*)    Neutro Abs 14.8 (*)    Monocytes Absolute 1.9 (*)    Abs Immature Granulocytes 0.11 (*)    All other components within normal limits  BASIC METABOLIC PANEL - Abnormal; Notable for the following components:   BUN 23 (*)    Calcium 10.7 (*)    Anion gap 16 (*)    All other components within normal limits    EKG None  Radiology No results found.  Procedures Procedures (including critical care time)  Medications Ordered in ED Medications  sodium chloride 0.9 % bolus 1,000 mL (0 mLs Intravenous Stopped 05/01/19 1441)  ondansetron (ZOFRAN) injection 4 mg (4 mg Intravenous Given 05/01/19 1302)  morphine 4 MG/ML injection 4 mg (4 mg Intravenous Given 05/01/19 1303)     Initial Impression / Assessment and Plan / ED Course  I have reviewed the triage vital signs and the nursing notes.  Pertinent labs & imaging results that were available during my care of the patient were reviewed by me and considered in my medical decision making (see chart for details).         48 year old male who presents for evaluation of continued persistent right neck and right shoulder pain.  He reports is been ongoing issue for last several weeks.  Was seen at [facility] long ED and was discharged with fentanyl and oxycodone.  States he does not like those medications as he feels like it is making him not eat and not be able to sleep.  He reports he has not been able to eat much or sleep over a week.  He has tried some over-the-counter sleep medications but states that has not helped.  He denies any chest pain, difficulty breathing, abdominal pain, nausea/vomiting.  He states he is able to swallow without difficulty but he just does not feel like eating.  Initially arrival, he is afebrile.  He is slightly tachycardic but vitals otherwise stable.  On exam, he has chronic induration of the right neck with some lymphadenopathy.  I reviewed his previous records and shows that this is consistent with previous exams.  Wife confirms that this is chronic in nature and is not something that is new. He was told this is consistent with his cancer. Does not appear infectious at this time.  Lungs clear to auscultation.  We will plan to check basic labs, give fluids, pain medication.  CBC shows leukocytosis of 20.1.  He does have history of previous leukocytosis.  Question if this is worsening of his cancer versus stress response.  Could indicate infectious etiology though he is not having any infectious in symptoms and the area on his neck does not look infected.  BMP shows BUN of 23.  Creatinine is within normal limits.  Anion gap is 16.  Patient given fluids.   Patient's tachycardia slightly improved after fluids.  He was able to try and drink and eat something and took a few bites but then states he still did not  feel like eating.  Patient still currently denies any chest pain, difficulty breathing.  He did state that the pain medication helped his pain.  We discussed at length regarding at home pain  medications.  He feels like the OxyContin and the fentanyl patch are making him not want to eat and he does not wish to take it anymore.  He discussed going with morphine.  Discussed this would be discussion with his oncology who he follows up with in 2 days.  Patient also discussed regarding sleeping medication.  I discussed over-the-counter options such as melatonin.  Discussed with Dr. Wilson Singer.  At this time, patient has no chest pain, difficulty breathing.  Do not feel that tachycardia is reflective of PE.  Additionally, do not feel that this is infectious process.  He has been tachycardic in last few visits.  At this time, patient is resting comfortably with no signs of distress, though still complains of some slight pain.  Patient stable for discharge at this time. At this time, patient exhibits no emergent life-threatening condition that require further evaluation in ED or admission. Patient had ample opportunity for questions and discussion. All patient's questions were answered with full understanding.  Strict return precautions discussed. Patient expresses understanding and agreement to plan.   Portions of this note were generated with Lobbyist. Dictation errors may occur despite best attempts at proofreading.    Final Clinical Impressions(s) / ED Diagnoses   Final diagnoses:  Difficulty sleeping  Loss of appetite  Tachycardia    ED Discharge Orders    None       Desma Mcgregor 05/02/19 0710    Virgel Manifold, MD 05/02/19 [phone]

## 2019-05-01 NOTE — ED Notes (Signed)
Gave pt [place] sandwich and orange juice.

## 2019-05-02 NOTE — Progress Notes (Signed)
PCCM: Thanks for seeing him  Garner Nash, DO [facility] 05/02/2019 1:22 PM

## 2019-05-03 ENCOUNTER — Other Ambulatory Visit: Payer: Self-pay

## 2019-05-03 ENCOUNTER — Encounter: Payer: Self-pay | Admitting: Internal Medicine

## 2019-05-03 ENCOUNTER — Inpatient Hospital Stay: Payer: Medicare Other

## 2019-05-03 ENCOUNTER — Ambulatory Visit
Admission: RE | Admit: 2019-05-03 | Discharge: 2019-05-03 | Disposition: A | Discharge status: Home / Self Care | Payer: Medicare Other | Source: Ambulatory Visit | Attending: Radiation Oncology | Admitting: Radiation Oncology

## 2019-05-03 ENCOUNTER — Inpatient Hospital Stay (HOSPITAL_BASED_OUTPATIENT_CLINIC_OR_DEPARTMENT_OTHER): Payer: Medicare Other | Admitting: Internal Medicine

## 2019-05-03 ENCOUNTER — Ambulatory Visit (HOSPITAL_BASED_OUTPATIENT_CLINIC_OR_DEPARTMENT_OTHER): Payer: Medicare Other | Admitting: Medical

## 2019-05-03 ENCOUNTER — Telehealth: Payer: Self-pay | Admitting: *Deleted

## 2019-05-03 VITALS — BP 142/106 | HR 121 | Temp 98.5°F | Resp 18

## 2019-05-03 VITALS — BP 138/108 | HR 130 | Temp 100.1°F | Resp 17

## 2019-05-03 DIAGNOSIS — Z5112 Encounter for antineoplastic immunotherapy: Secondary | ICD-10-CM

## 2019-05-03 DIAGNOSIS — C3491 Malignant neoplasm of unspecified part of right bronchus or lung: Secondary | ICD-10-CM

## 2019-05-03 DIAGNOSIS — F17200 Nicotine dependence, unspecified, uncomplicated: Secondary | ICD-10-CM

## 2019-05-03 DIAGNOSIS — R52 Pain, unspecified: Secondary | ICD-10-CM

## 2019-05-03 DIAGNOSIS — G893 Neoplasm related pain (acute) (chronic): Secondary | ICD-10-CM

## 2019-05-03 DIAGNOSIS — Z510 Encounter for antineoplastic radiation therapy: Secondary | ICD-10-CM | POA: Diagnosis not present

## 2019-05-03 DIAGNOSIS — Z7189 Other specified counseling: Secondary | ICD-10-CM | POA: Diagnosis not present

## 2019-05-03 DIAGNOSIS — C3411 Malignant neoplasm of upper lobe, right bronchus or lung: Secondary | ICD-10-CM | POA: Diagnosis not present

## 2019-05-03 LAB — CBC WITH DIFFERENTIAL (CANCER CENTER ONLY)
Abs Immature Granulocytes: 0.1 10*3/uL — ABNORMAL HIGH (ref 0.00–0.07)
Basophils Absolute: 0.1 10*3/uL (ref 0.0–0.1)
Basophils Relative: 0 %
Eosinophils Absolute: 0.1 10*3/uL (ref 0.0–0.5)
Eosinophils Relative: 1 %
HCT: 49 % (ref 39.0–52.0)
Hemoglobin: 16.9 g/dL (ref 13.0–17.0)
Immature Granulocytes: 1 %
Lymphocytes Relative: 19 %
Lymphs Abs: 3.4 10*3/uL (ref 0.7–4.0)
MCH: 30.6 pg (ref 26.0–34.0)
MCHC: 34.5 g/dL (ref 30.0–36.0)
MCV: 88.6 fL (ref 80.0–100.0)
Monocytes Absolute: 1.8 10*3/uL — ABNORMAL HIGH (ref 0.1–1.0)
Monocytes Relative: 10 %
Neutro Abs: 13 10*3/uL — ABNORMAL HIGH (ref 1.7–7.7)
Neutrophils Relative %: 69 %
Platelet Count: 227 10*3/uL (ref 150–400)
RBC: 5.53 MIL/uL (ref 4.22–5.81)
RDW: 13.8 % (ref 11.5–15.5)
WBC Count: 18.5 10*3/uL — ABNORMAL HIGH (ref 4.0–10.5)
nRBC: 0 % (ref 0.0–0.2)

## 2019-05-03 LAB — CMP (CANCER CENTER ONLY)
ALT: 14 U/L (ref 0–44)
AST: 12 U/L — ABNORMAL LOW (ref 15–41)
Albumin: 3.7 g/dL (ref 3.5–5.0)
Alkaline Phosphatase: 81 U/L (ref 38–126)
Anion gap: 11 (ref 5–15)
BUN: 20 mg/dL (ref 6–20)
CO2: 24 mmol/L (ref 22–32)
Calcium: 10.4 mg/dL — ABNORMAL HIGH (ref 8.9–10.3)
Chloride: 103 mmol/L (ref 98–111)
Creatinine: 0.85 mg/dL (ref 0.61–1.24)
GFR, Est AFR Am: >60 mL/min (ref >60)
GFR, Estimated: >60 mL/min (ref >60)
Glucose, Bld: 108 mg/dL — ABNORMAL HIGH (ref 70–99)
Potassium: 4.2 mmol/L (ref 3.5–5.1)
Sodium: 138 mmol/L (ref 135–145)
Total Bilirubin: 0.6 mg/dL (ref 0.3–1.2)
Total Protein: 7.5 g/dL (ref 6.5–8.1)

## 2019-05-03 MED ORDER — MORPHINE SULFATE 15 MG PO TABS: 15.0000 mg | ORAL_TABLET | Freq: Four times a day (QID) | ORAL | 0 refills | Status: AC | PRN

## 2019-05-03 MED ORDER — SODIUM CHLORIDE 0.9 % IV SOLN
Freq: Once | INTRAVENOUS | Status: AC
  Administered 2019-05-03: 13:00:00 via INTRAVENOUS
  Filled 2019-05-03: qty 250

## 2019-05-03 MED ORDER — MORPHINE SULFATE ER 30 MG PO TBCR: 30.0000 mg | EXTENDED_RELEASE_TABLET | Freq: Two times a day (BID) | ORAL | 0 refills | Status: AC

## 2019-05-03 NOTE — Progress Notes (Signed)
DISCONTINUE ON PATHWAY REGIMEN - Non-Small Cell Lung     Administer weekly:     Paclitaxel      Carboplatin   **Always confirm dose/schedule in your pharmacy ordering system**  REASON: Change In Patient Status PRIOR TREATMENT: [id number]: Carboplatin AUC=2 + Paclitaxel 45 mg/m2 Weekly During Radiation  START ON PATHWAY REGIMEN - Non-Small Cell Lung     A cycle is 21 days:     Pembrolizumab   **Always confirm dose/schedule in your pharmacy ordering system**  Patient Characteristics: Stage IV Metastatic, Nonsquamous, Initial Chemotherapy/Immunotherapy, PS = 0, 1, ALK Rearrangement Negative and EGFR Mutation Negative/Non-Sensitizing, PD-L1 Expression Positive  ? 50% (TPS) and Immunotherapy Candidate AJCC T Category: T1c Current Disease Status: Distant Metastases AJCC N Category: N3 AJCC M Category: M1c AJCC 8 Stage Grouping: IVB Histology: Nonsquamous Cell ROS1 Rearrangement Status: Negative T790M Mutation Status: Not Applicable - EGFR Mutation Negative/Unknown Other Mutations/Biomarkers: No Other Actionable Mutations NTRK Gene Fusion Status: Negative PD-L1 Expression Status: PD-L1 Positive ? 50% (TPS) Chemotherapy/Immunotherapy LOT: Initial Chemotherapy/Immunotherapy Molecular Targeted Therapy: Not Appropriate ALK Rearrangement Status: Negative EGFR Mutation Status: Negative/Wild Type BRAF V600E Mutation Status: Negative ECOG Performance Status: 1 Immunotherapy Candidate Status: Candidate for Immunotherapy Intent of Therapy: Non-Curative / Palliative Intent, Discussed with Patient

## 2019-05-03 NOTE — Patient Instructions (Signed)
Dehydration, Adult  Dehydration is when there is not enough fluid or water in your body. This happens when you lose more fluids than you take in. Dehydration can range from mild to very bad. It should be treated right away to keep it from getting very bad. Symptoms of mild dehydration may include:  Thirst.  Dry lips.  Slightly dry mouth.  Dry, warm skin.  Dizziness. Symptoms of moderate dehydration may include:  Very dry mouth.  Muscle cramps.  Dark pee (urine). Pee may be the color of tea.  Your body making less pee.  Your eyes making fewer tears.  Heartbeat that is uneven or faster than normal (palpitations).  Headache.  Light-headedness, especially when you stand up from sitting.  Fainting (syncope). Symptoms of very bad dehydration may include:  Changes in skin, such as: ? Cold and clammy skin. ? Blotchy (mottled) or pale skin. ? Skin that does not quickly return to normal after being lightly pinched and let go (poor skin turgor).  Changes in body fluids, such as: ? Feeling very thirsty. ? Your eyes making fewer tears. ? Not sweating when body temperature is high, such as in hot weather. ? Your body making very little pee.  Changes in vital signs, such as: ? Weak pulse. ? Pulse that is more than 100 beats a minute when you are sitting still. ? Fast breathing. ? Low blood pressure.  Other changes, such as: ? Sunken eyes. ? Cold hands and feet. ? Confusion. ? Lack of energy (lethargy). ? Trouble waking up from sleep. ? Short-term weight loss. ? Unconsciousness. Follow these instructions at home:   If told by your doctor, drink an ORS: ? Make an ORS by using instructions on the package. ? Start by drinking small amounts, about  cup (120 mL) every 5-10 minutes. ? Slowly drink more until you have had the amount that your doctor said to have.  Drink enough clear fluid to keep your pee clear or pale yellow. If you were told to drink an ORS, finish the  ORS first, then start slowly drinking clear fluids. Drink fluids such as: ? Water. Do not drink only water by itself. Doing that can make the salt (sodium) level in your body get too low (hyponatremia). ? Ice chips. ? Fruit juice that you have added water to (diluted). ? Low-calorie sports drinks.  Avoid: ? Alcohol. ? Drinks that have a lot of sugar. These include high-calorie sports drinks, fruit juice that does not have water added, and soda. ? Caffeine. ? Foods that are greasy or have a lot of fat or sugar.  Take over-the-counter and prescription medicines only as told by your doctor.  Do not take salt tablets. Doing that can make the salt level in your body get too high (hypernatremia).  Eat foods that have minerals (electrolytes). Examples include bananas, oranges, potatoes, tomatoes, and spinach.  Keep all follow-up visits as told by your doctor. This is important. Contact a doctor if:  You have belly (abdominal) pain that: ? Gets worse. ? Stays in one area (localizes).  You have a rash.  You have a stiff neck.  You get angry or annoyed more easily than normal (irritability).  You are more sleepy than normal.  You have a harder time waking up than normal.  You feel: ? Weak. ? Dizzy. ? Very thirsty.  You have peed (urinated) only a small amount of very dark pee during 6-8 hours. Get help right away if:  You have   symptoms of very bad dehydration.  You cannot drink fluids without throwing up (vomiting).  Your symptoms get worse with treatment.  You have a fever.  You have a very bad headache.  You are throwing up or having watery poop (diarrhea) and it: ? Gets worse. ? Does not go away.  You have blood or something green (bile) in your throw-up.  You have blood in your poop (stool). This may cause poop to look black and tarry.  You have not peed in 6-8 hours.  You pass out (faint).  Your heart rate when you are sitting still is more than 100 beats a  minute.  You have trouble breathing. This information is not intended to replace advice given to you by your health care provider. Make sure you discuss any questions you have with your health care provider. Document Released: 05/18/2009 Document Revised: 07/04/2017 Document Reviewed: 09/15/2015 Elsevier Patient Education  2020 Elsevier Inc.  

## 2019-05-03 NOTE — Progress Notes (Signed)
[facility] [phone]   [phone]  OFFICE PROGRESS NOTE  [name], MD [place] [address]  DIAGNOSIS: Stage IV (T3, N3, M1c) non-small cell lung cancer, adenocarcinoma presented with right upper lobe lung mass in addition to bilateral mediastinal, bilateral supraclavicular, right subpectoral and right axillary and cervical lymphadenopathy as well as right upper lobe and left chest wall invasion diagnosed in September 2020  Biomarker Findings Tumor Mutational Burden - 62 Muts/Mb Microsatellite status - MS-Stable Genomic Findings For a complete list of the genes assayed, please refer to the Appendix. BRAF G469A PIK3CA E545K, D1045N MYC amplification BCORL1 S234* BRD4 Q109f*51 CIC EQ94 CTNNB1 splice site 27654-6T>KFLT3 R834Q IKBKE amplification MCL1 amplification PIM1 amplification - equivocal SDHA E152* SETD2 QP5465 TKC12splice site 7751+7G>Y7 Disease relevant genes with no reportable alterations: ALK, EGFR, ERBB2, KRAS, MET, RET, ROS1   PDL 1 expression 100%  PRIOR THERAPY: Palliative radiotherapy to the chest wall invasion under the care of Dr. MLisbeth Renshaw  CURRENT THERAPY: Immunotherapy with Keytruda 200 mg IV every 3 weeks.  First dose May 10, 2019.  INTERVAL HISTORY: Justin [id number]. male returns to the clinic today for follow-up visit.  The patient continues to complain of severe pain on the right side of the chest as well as neck area.  He is scheduled to start palliative radiotherapy today.  He denied having any current shortness of breath, cough or hemoptysis.  He denied having any fever or chills.  He lost few pounds since his last visit.  He has lack of appetite.  He has no headache or visual changes.  He was unable to proceed with the MRI of the brain because he is in a lot of pain.  He had molecular studies performed by [facility] and that showed no actionable mutation but his PDL 1  expression was 100%. The patient is here today for evaluation and discussion of his treatment options. MEDICAL HISTORY: Past Medical History:  Diagnosis Date   Anxiety    Cancer ([place]    Sciatica    Ulcer of the stomach and intestine     ALLERGIES:  is allergic to hydrocodone and oxycodone.  MEDICATIONS:  Current Outpatient Medications  Medication Sig Dispense Refill   acetaminophen (TYLENOL) 325 MG tablet Take 650 mg by mouth every 6 (six) hours as needed for mild pain or headache.      fentaNYL (DURAGESIC) 25 MCG/HR Place 1 patch onto the skin every 3 (three) days. Rotate sites every application 1 patch 0   ibuprofen (ADVIL) 600 MG tablet Take 1 tablet (600 mg total) by mouth every 6 (six) hours as needed. 30 tablet 0   oxyCODONE-acetaminophen (PERCOCET) 10-325 MG tablet Take 1 tablet by mouth every 6 (six) hours as needed for pain (cancer related pain). 90 tablet 0   prochlorperazine (COMPAZINE) 10 MG tablet TAKE 1 TABLET(10 MG) BY MOUTH EVERY 6 HOURS AS NEEDED FOR NAUSEA OR VOMITING (Patient taking differently: Take 10 mg by mouth every 6 (six) hours as needed for nausea or vomiting. ) 30 tablet 0   tiZANidine (ZANAFLEX) 4 MG tablet TAKE 1 TABLET(4 MG) BY MOUTH EVERY 8 HOURS AS NEEDED FOR MUSCLE SPASMS 20 tablet 0   No current facility-administered medications for this visit.     SURGICAL HISTORY:  Past Surgical History:  Procedure Laterality Date   IR [place] BX ASP/DRAIN  04/08/2019   NO PAST SURGERIES  REVIEW OF SYSTEMS:  Constitutional: positive for anorexia, fatigue and weight loss Eyes: negative Ears, nose, mouth, throat, and face: negative Respiratory: positive for dyspnea on exertion and pleurisy/chest pain Cardiovascular: negative Gastrointestinal: negative Genitourinary:negative Integument/breast: negative Hematologic/lymphatic: negative Musculoskeletal:positive for bone pain Neurological: negative Behavioral/Psych: negative Endocrine:  negative Allergic/Immunologic: negative   PHYSICAL EXAMINATION: General appearance: alert, cooperative, fatigued and no distress Head: Normocephalic, without obvious abnormality, atraumatic Neck: no adenopathy, no JVD, supple, symmetrical, trachea midline and thyroid not enlarged, symmetric, no tenderness/mass/nodules Lymph nodes: Cervical, supraclavicular, and axillary nodes normal. Resp: clear to auscultation bilaterally Back: symmetric, no curvature. ROM normal. No CVA tenderness. Cardio: regular rate and rhythm, S1, S2 normal, no murmur, click, rub or gallop GI: soft, non-tender; bowel sounds normal; no masses,  no organomegaly Extremities: extremities normal, atraumatic, no cyanosis or edema Neurologic: Alert and oriented X 3, normal strength and tone. Normal symmetric reflexes. Normal coordination and gait  ECOG PERFORMANCE STATUS: 1 - Symptomatic but completely ambulatory  Blood pressure (!) 138/108, pulse (!) 130, temperature 100.1 F (37.8 C), temperature source Temporal, resp. rate 17, SpO2 100 %.  LABORATORY DATA: Lab Results  Component Value Date   WBC 18.5 (H) 05/03/2019   HGB 16.9 05/03/2019   HCT 49.0 05/03/2019   MCV 88.6 05/03/2019   PLT 227 05/03/2019      Chemistry      Component Value Date/Time   NA 138 05/03/2019 1058   K 4.2 05/03/2019 1058   CL 103 05/03/2019 1058   CO2 24 05/03/2019 1058   BUN 20 05/03/2019 1058   CREATININE 0.85 05/03/2019 1058      Component Value Date/Time   CALCIUM 10.4 (H) 05/03/2019 1058   ALKPHOS 81 05/03/2019 1058   AST 12 (L) 05/03/2019 1058   ALT 14 05/03/2019 1058   BILITOT 0.6 05/03/2019 1058       RADIOGRAPHIC STUDIES: Nm Pet Image Initial (pi) Skull Base To Thigh  Result Date: 04/20/2019 CLINICAL DATA:  Initial treatment strategy for metastatic lung cancer. EXAM: NUCLEAR MEDICINE PET SKULL BASE TO THIGH TECHNIQUE: 8.74 mCi F-18 FDG was injected intravenously. Full-ring PET imaging was performed from the skull  base to thigh after the radiotracer. CT data was obtained and used for attenuation correction and anatomic localization. Fasting blood glucose: 99 mg/dl COMPARISON:  CT chest, abdomen and pelvis 03/30/2019 FINDINGS: Mediastinal blood pool activity: SUV max 2.95 Liver activity: SUV max NA NECK: Extensive FDG avid adenopathy is identified within right cervical lymph node chain. Hypermetabolic lymph nodes are identified within the right level 2, 3, 4 and 5 stations. Index right level 2 node measures 1.1 cm within SUV max of 18.47. Index right level 3/4 lymph node measures 2.2 cm short axis and has an SUV max of 20.12. Incidental CT findings: none CHEST: Extensive right supraclavicular hypermetabolic adenopathy identified. Index right supraclavicular lymph node measures 2.9 cm with SUV max of 18.58. Hypermetabolic right retropectoral and right axillary lymph nodes identified. Right axillary node measures 1.9 cm short axis and has an SUV max of 26.4. Multiple left supraclavicular hypermetabolic lymph nodes are identified. Index lymph node measures 1.3 cm with SUV max of 12.59. Hypermetabolic bilateral mediastinal, subcarinal, and posterior mediastinal adenopathy identified: Right pre-vascular node measures 2.8 cm and has an SUV max of 14.1. Index left pre-vascular lymph node measures 1.5 cm with SUV max of 17.02. Subcarinal lymph node measures 1.2 cm within SUV max of 12.14. Posterior mediastinal lymph node between the aorta and esophagus adjacent to the left  mainstem bronchus measures 2.3 cm and has an SUV max of 15.35. Right apical lung mass with central cavitation measures 4 cm and has an SUV max of 16.38. Incidental CT findings: Advanced bullous emphysema. Small pericardial effusion. Mild aortic atherosclerosis. ABDOMEN/PELVIS: No abnormal hypermetabolic activity within the liver, pancreas, adrenal glands, or spleen. No hypermetabolic lymph nodes in the abdomen or pelvis. Incidental CT findings: Aortic  atherosclerosis. SKELETON: 2 foci of chest wall involvement are identified involving the upper medial aspect of the right pectoralis musculature with SUV max of 11.7. This appears to be a direct extension from right supraclavicular tumor. On the left, there is asymmetric increased uptake along the left internal mammary lymph node chain with extension into the second left costosternal junction. No findings to suggest distant osseous metastatic disease within the axial or proximal appendicular skeleton. Incidental CT findings: none IMPRESSION: 1. Hypermetabolic right upper lobe lung mass is again noted compatible with primary bronchogenic carcinoma. 2. Bilateral mediastinal, bilateral supraclavicular, right subpectoral and right axillary, and right level 2, 3, 4, and 5 cervical hypermetabolic nodal metastasis. 3. Evidence of right upper chest wall involvement by tumor which is felt to be a direct extension from bulky right supraclavicular adenopathy. 4. Left chest wall involvement is also suspected at the left second costosternal junction. This may be the result of direct extension from left internal mammary nodal metastasis. Electronically Signed   By: Kerby Moors M.D.   On: 04/20/2019 14:11   Ir US Guide Bx Asp/drain  Result Date: 04/08/2019 INDICATION: 48 year old male with suspected metastatic lung cancer and right supraclavicular lymphadenopathy. He presents for biopsy to confirm tissue diagnosis. EXAM: Ultrasound-guided core biopsy right supraclavicular lymph node MEDICATIONS: None. ANESTHESIA/SEDATION: None.  Performed under local anesthetic only. FLUOROSCOPY TIME:  None. COMPLICATIONS: None immediate. PROCEDURE: Informed written consent was obtained from the patient after a thorough discussion of the procedural risks, benefits and alternatives. All questions were addressed. Maximal Sterile Barrier Technique was utilized including caps, mask, sterile gowns, sterile gloves, sterile drape, hand hygiene and  skin antiseptic. A timeout was performed prior to the initiation of the procedure. Ultrasound was used to interrogate the right supraclavicular space. Enlarged, irregular and lobulated right supraclavicular lymph node identified measuring 3.2 x 2.0 cm. A suitable skin entry site was selected and marked. Local anesthesia was attained by infiltration with 1% lidocaine. A small dermatotomy was made. Under real-time ultrasound guidance, multiple 18 gauge core biopsies were obtained coaxially using the Bard mission automated biopsy device. Biopsy specimens were placed in formalin and delivered to pathology for further analysis. There was no immediate complication. IMPRESSION: Successful ultrasound-guided core biopsy of right supraclavicular lymphadenopathy. Electronically Signed   By: Jacqulynn Cadet M.D.   On: 04/08/2019 11:08    ASSESSMENT AND PLAN: This is a very pleasant 48 years old African-American male with metastatic non-small cell lung cancer, adenocarcinoma with no actionable mutation but positive PDL 1 expression of 100%. The patient continues to have severe pain on the right side of the chest.  He is expected to start palliative radiotherapy to this area today. Our initial plan was to do a course of concurrent chemoradiation but now with the metastatic nature of his disease, he would benefit from palliative radiotherapy to the painful lesions followed by treatment with immunotherapy with Keytruda. His PDL 1 expression is 100% and the patient would be great candidate for single agent immunotherapy with Keytruda.  I discussed with him his treatment options including palliative care versus the proceeding with immunotherapy.  The patient is interested in treatment with immunotherapy and he is expected to start the first cycle of this treatment next week.  We provided the patient with handout about Keytruda.  I also discussed with him the adverse effect of this treatment. He will come back for follow-up  visit in 2 weeks for evaluation and management of any adverse effect of his treatment. For pain management I started the patient on long-acting MS Contin 30 mg p.o. every 12 hour in addition to MS IR 15 mg p.o. every 6 hours as needed for breakthrough pain. The patient was advised to call immediately if he has any concerning symptoms in the interval. The patient voices understanding of current disease status and treatment options and is in agreement with the current care plan.  All questions were answered. The patient knows to call the clinic with any problems, questions or concerns. We can certainly see the patient much sooner if necessary.  I spent 15 minutes counseling the patient face to face. The total time spent in the appointment was 25 minutes.  Disclaimer: This note was dictated with voice recognition software. Similar sounding words can inadvertently be transcribed and may not be corrected upon review.

## 2019-05-03 NOTE — Telephone Encounter (Signed)
Looks like this will be picked up by oncology starting today when he starts chemo and radiation.   Justin Randall

## 2019-05-03 NOTE — Telephone Encounter (Signed)
Oncology Nurse Navigator Documentation  Oncology Nurse Navigator Flowsheets 05/03/2019  Navigator Location [facility]  Referral Date to RadOnc/MedOnc -  Navigator Encounter Type Telephone/I followed up on Justin Randall MRI brain.  He did not get his scan.  He has not shown for the past 4 previously scheduled MRI brain scans.  I called him today and asked him how he is doing.  He states he is in to much pain for scan.  He has been given pain medications but still unable to complete scan.  Patient is coming in today to see Dr. Julien Nordmann and requested I asked him to get him pain meds and sleeping meds.  I told him I will pass that information on to Dr. Julien Nordmann.  I updated Dr. Julien Nordmann and Dr. Lisbeth Renshaw that patient has not shown for the past 4 scheduled mri brain.    Telephone Outgoing Call  [facility] Date -  Patient Visit Type -  Treatment Phase Treatment  Barriers/Navigation Needs Coordination of Care;Education  Education Other  Interventions Coordination of Care;Education  Coordination of Care Other  Education Method Verbal  Acuity Level 2-Minimal Needs (1-2 Barriers Identified)  Time Spent with Patient 30

## 2019-05-04 ENCOUNTER — Ambulatory Visit: Payer: Medicare Other

## 2019-05-04 ENCOUNTER — Telehealth: Payer: Self-pay | Admitting: Internal Medicine

## 2019-05-04 ENCOUNTER — Telehealth: Payer: Self-pay | Admitting: Medical Oncology

## 2019-05-04 NOTE — Telephone Encounter (Signed)
Pt stated he did not get his MS contin filled. I called pharmacy and told them it was written at same time as MSIR and pt said he just got the IR.

## 2019-05-04 NOTE — Telephone Encounter (Signed)
Scheduled appt per 9/28 los- pt aware of appt date and time

## 2019-05-05 ENCOUNTER — Ambulatory Visit: Payer: Medicare Other

## 2019-05-05 NOTE — Progress Notes (Signed)
This patient was seen upfront today and was this provider was called to evaluate him as the patient was doubled over in a chair and was complaining of pain.  He reports that he has had changes in his pain medications lately as he has not taken them because he has had a reaction.  He is scheduled to see Dr. Fanny Bien. Mohamed today and plans to address these issues with them at that time.  Sandi Mealy, MHS, PA-C Physician Assistant

## 2019-05-05 NOTE — Telephone Encounter (Signed)
I called pt and told him the [facility] is being prior authorized.

## 2019-05-06 ENCOUNTER — Other Ambulatory Visit: Payer: Self-pay

## 2019-05-06 ENCOUNTER — Ambulatory Visit
Admission: RE | Admit: 2019-05-06 | Discharge: 2019-05-06 | Disposition: A | Discharge status: Home / Self Care | Payer: Medicare Other | Source: Ambulatory Visit | Attending: Radiation Oncology | Admitting: Radiation Oncology

## 2019-05-06 DIAGNOSIS — F17210 Nicotine dependence, cigarettes, uncomplicated: Secondary | ICD-10-CM | POA: Insufficient documentation

## 2019-05-06 DIAGNOSIS — C3491 Malignant neoplasm of unspecified part of right bronchus or lung: Secondary | ICD-10-CM | POA: Insufficient documentation

## 2019-05-06 DIAGNOSIS — Z510 Encounter for antineoplastic radiation therapy: Secondary | ICD-10-CM | POA: Diagnosis not present

## 2019-05-06 DIAGNOSIS — C770 Secondary and unspecified malignant neoplasm of lymph nodes of head, face and neck: Secondary | ICD-10-CM | POA: Diagnosis not present

## 2019-05-06 NOTE — Progress Notes (Signed)
  Radiation Oncology         [phone] ________________________________  Name: Justin Randall MRN: [id number]  Date: 04/30/2019  DOB: [birth year]  SIMULATION AND TREATMENT PLANNING NOTE  DIAGNOSIS:     ICD-10-CM   1. 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  Identity was confirmed.  All relevant records and images related to the planned course of therapy were reviewed.   Written consent to proceed with treatment was confirmed which was freely given after reviewing the details related to the planned course of therapy had been reviewed with the patient.  Then, the patient was set-up in a stable reproducible  supine position for radiation therapy.  CT images were obtained.  Surface markings were placed.    Medically necessary complex treatment device(s) for immobilization:  Thermoplastic mask,  accuform device.   The CT images were loaded into the planning software.  Then the target and avoidance structures were contoured.  Treatment planning then occurred.  The radiation prescription was entered and confirmed.  A total of 4 complex treatment devices were fabricated which relate to the designed radiation treatment fields. Each of these customized fields/ complex treatment devices will be used on a daily basis during the radiation course. I have requested : 3D Simulation  I have requested a DVH of the following structures: GTV, lungs, cord.   The patient will undergo daily image guidance to ensure accurate localization of the target, and adequate minimize dose to the normal surrounding structures in close proximity to the target.   PLAN:  The patient will receive 37.5 Gy in 15 fractions.  Special treatment procedure The patient will also receive concurrent chemotherapy during the treatment. The patient may therefore experience increased toxicity or side effects and the patient  will be monitored for such problems. This may require extra lab work as necessary. This therefore constitutes a special treatment procedure.   ________________________________   Jodelle Gross, MD, PhD

## 2019-05-07 ENCOUNTER — Ambulatory Visit: Payer: Medicare Other

## 2019-05-10 ENCOUNTER — Inpatient Hospital Stay: Payer: Medicare Other

## 2019-05-10 ENCOUNTER — Ambulatory Visit: Payer: Medicare Other

## 2019-05-10 ENCOUNTER — Telehealth: Payer: Self-pay | Admitting: Internal Medicine

## 2019-05-10 NOTE — Telephone Encounter (Signed)
Returned patient's phone call regarding rescheduling cancelled 10/05 appointment, per patient's request appointment rescheduled to 10/07.    Message to provider.

## 2019-05-11 ENCOUNTER — Ambulatory Visit: Payer: Medicare Other

## 2019-05-12 ENCOUNTER — Ambulatory Visit: Payer: Medicare Other

## 2019-05-12 ENCOUNTER — Other Ambulatory Visit: Payer: Medicare Other

## 2019-05-13 ENCOUNTER — Ambulatory Visit: Payer: Medicare Other

## 2019-05-14 ENCOUNTER — Ambulatory Visit: Payer: Medicare Other

## 2019-05-14 ENCOUNTER — Encounter: Payer: Self-pay | Admitting: Radiation Oncology

## 2019-05-15 ENCOUNTER — Encounter (HOSPITAL_COMMUNITY): Payer: Self-pay

## 2019-05-15 ENCOUNTER — Other Ambulatory Visit: Payer: Self-pay

## 2019-05-15 ENCOUNTER — Observation Stay (HOSPITAL_COMMUNITY): Payer: Medicare Other

## 2019-05-15 ENCOUNTER — Inpatient Hospital Stay (HOSPITAL_COMMUNITY)
Admission: EM | Admit: 2019-05-15 | Discharge: 2019-06-06 | DRG: 189 | Disposition: E | Payer: Medicare Other | Attending: Internal Medicine | Admitting: Internal Medicine

## 2019-05-15 ENCOUNTER — Encounter (HOSPITAL_COMMUNITY): Payer: Medicare Other

## 2019-05-15 ENCOUNTER — Emergency Department (HOSPITAL_COMMUNITY): Payer: Medicare Other

## 2019-05-15 DIAGNOSIS — C792 Secondary malignant neoplasm of skin: Secondary | ICD-10-CM | POA: Diagnosis present

## 2019-05-15 DIAGNOSIS — C7951 Secondary malignant neoplasm of bone: Secondary | ICD-10-CM | POA: Diagnosis present

## 2019-05-15 DIAGNOSIS — J439 Emphysema, unspecified: Secondary | ICD-10-CM | POA: Diagnosis present

## 2019-05-15 DIAGNOSIS — Z7401 Bed confinement status: Secondary | ICD-10-CM

## 2019-05-15 DIAGNOSIS — L89221 Pressure ulcer of left hip, stage 1: Secondary | ICD-10-CM | POA: Diagnosis present

## 2019-05-15 DIAGNOSIS — Z79891 Long term (current) use of opiate analgesic: Secondary | ICD-10-CM

## 2019-05-15 DIAGNOSIS — R531 Weakness: Secondary | ICD-10-CM

## 2019-05-15 DIAGNOSIS — R000 Tachycardia, unspecified: Secondary | ICD-10-CM

## 2019-05-15 DIAGNOSIS — Z681 Body mass index (BMI) 19 or less, adult: Secondary | ICD-10-CM

## 2019-05-15 DIAGNOSIS — E872 Acidosis: Secondary | ICD-10-CM | POA: Diagnosis present

## 2019-05-15 DIAGNOSIS — C3491 Malignant neoplasm of unspecified part of right bronchus or lung: Secondary | ICD-10-CM | POA: Diagnosis present

## 2019-05-15 DIAGNOSIS — Z20828 Contact with and (suspected) exposure to other viral communicable diseases: Secondary | ICD-10-CM | POA: Diagnosis present

## 2019-05-15 DIAGNOSIS — Z923 Personal history of irradiation: Secondary | ICD-10-CM

## 2019-05-15 DIAGNOSIS — E860 Dehydration: Secondary | ICD-10-CM | POA: Diagnosis present

## 2019-05-15 DIAGNOSIS — J9601 Acute respiratory failure with hypoxia: Secondary | ICD-10-CM | POA: Diagnosis present

## 2019-05-15 DIAGNOSIS — C3411 Malignant neoplasm of upper lobe, right bronchus or lung: Secondary | ICD-10-CM | POA: Diagnosis present

## 2019-05-15 DIAGNOSIS — I2694 Multiple subsegmental pulmonary emboli without acute cor pulmonale: Secondary | ICD-10-CM | POA: Diagnosis not present

## 2019-05-15 DIAGNOSIS — J9600 Acute respiratory failure, unspecified whether with hypoxia or hypercapnia: Secondary | ICD-10-CM

## 2019-05-15 DIAGNOSIS — F419 Anxiety disorder, unspecified: Secondary | ICD-10-CM | POA: Diagnosis present

## 2019-05-15 DIAGNOSIS — Z515 Encounter for palliative care: Secondary | ICD-10-CM

## 2019-05-15 DIAGNOSIS — Z8711 Personal history of peptic ulcer disease: Secondary | ICD-10-CM

## 2019-05-15 DIAGNOSIS — F17200 Nicotine dependence, unspecified, uncomplicated: Secondary | ICD-10-CM | POA: Diagnosis present

## 2019-05-15 DIAGNOSIS — L8990 Pressure ulcer of unspecified site, unspecified stage: Secondary | ICD-10-CM | POA: Insufficient documentation

## 2019-05-15 DIAGNOSIS — Z6820 Body mass index (BMI) 20.0-20.9, adult: Secondary | ICD-10-CM

## 2019-05-15 DIAGNOSIS — F32A Depression, unspecified: Secondary | ICD-10-CM | POA: Diagnosis present

## 2019-05-15 DIAGNOSIS — R0600 Dyspnea, unspecified: Secondary | ICD-10-CM

## 2019-05-15 DIAGNOSIS — F17210 Nicotine dependence, cigarettes, uncomplicated: Secondary | ICD-10-CM | POA: Diagnosis present

## 2019-05-15 DIAGNOSIS — Z885 Allergy status to narcotic agent status: Secondary | ICD-10-CM

## 2019-05-15 DIAGNOSIS — Z9221 Personal history of antineoplastic chemotherapy: Secondary | ICD-10-CM

## 2019-05-15 DIAGNOSIS — E440 Moderate protein-calorie malnutrition: Secondary | ICD-10-CM | POA: Insufficient documentation

## 2019-05-15 DIAGNOSIS — E43 Unspecified severe protein-calorie malnutrition: Secondary | ICD-10-CM | POA: Diagnosis present

## 2019-05-15 DIAGNOSIS — Z79899 Other long term (current) drug therapy: Secondary | ICD-10-CM

## 2019-05-15 DIAGNOSIS — I2699 Other pulmonary embolism without acute cor pulmonale: Secondary | ICD-10-CM | POA: Diagnosis present

## 2019-05-15 DIAGNOSIS — R0902 Hypoxemia: Secondary | ICD-10-CM

## 2019-05-15 DIAGNOSIS — J438 Other emphysema: Secondary | ICD-10-CM | POA: Diagnosis present

## 2019-05-15 DIAGNOSIS — J690 Pneumonitis due to inhalation of food and vomit: Secondary | ICD-10-CM | POA: Diagnosis not present

## 2019-05-15 DIAGNOSIS — G893 Neoplasm related pain (acute) (chronic): Secondary | ICD-10-CM | POA: Diagnosis present

## 2019-05-15 DIAGNOSIS — F329 Major depressive disorder, single episode, unspecified: Secondary | ICD-10-CM | POA: Diagnosis present

## 2019-05-15 DIAGNOSIS — K5900 Constipation, unspecified: Secondary | ICD-10-CM | POA: Diagnosis present

## 2019-05-15 DIAGNOSIS — R627 Adult failure to thrive: Secondary | ICD-10-CM | POA: Diagnosis present

## 2019-05-15 DIAGNOSIS — Z66 Do not resuscitate: Secondary | ICD-10-CM | POA: Diagnosis not present

## 2019-05-15 LAB — COMPREHENSIVE METABOLIC PANEL
ALT: 22 U/L (ref 0–44)
AST: 19 U/L (ref 15–41)
Albumin: 3.3 g/dL — ABNORMAL LOW (ref 3.5–5.0)
Alkaline Phosphatase: 110 U/L (ref 38–126)
Anion gap: 16 — ABNORMAL HIGH (ref 5–15)
BUN: 40 mg/dL — ABNORMAL HIGH (ref 6–20)
CO2: 24 mmol/L (ref 22–32)
Calcium: 10.3 mg/dL (ref 8.9–10.3)
Chloride: 102 mmol/L (ref 98–111)
Creatinine, Ser: 1 mg/dL (ref 0.61–1.24)
GFR calc Af Amer: >60 mL/min (ref >60)
GFR calc non Af Amer: >60 mL/min (ref >60)
Glucose, Bld: 103 mg/dL — ABNORMAL HIGH (ref 70–99)
Potassium: 4.6 mmol/L (ref 3.5–5.1)
Sodium: 142 mmol/L (ref 135–145)
Total Bilirubin: 1.7 mg/dL — ABNORMAL HIGH (ref 0.3–1.2)
Total Protein: 7.6 g/dL (ref 6.5–8.1)

## 2019-05-15 LAB — CBC WITH DIFFERENTIAL/PLATELET
Abs Immature Granulocytes: 0.28 10*3/uL — ABNORMAL HIGH (ref 0.00–0.07)
Basophils Absolute: 0.1 10*3/uL (ref 0.0–0.1)
Basophils Relative: 0 %
Eosinophils Absolute: 0 10*3/uL (ref 0.0–0.5)
Eosinophils Relative: 0 %
HCT: 48.9 % (ref 39.0–52.0)
Hemoglobin: 15.8 g/dL (ref 13.0–17.0)
Immature Granulocytes: 1 %
Lymphocytes Relative: 10 %
Lymphs Abs: 2.4 10*3/uL (ref 0.7–4.0)
MCH: 30 pg (ref 26.0–34.0)
MCHC: 32.3 g/dL (ref 30.0–36.0)
MCV: 92.8 fL (ref 80.0–100.0)
Monocytes Absolute: 2 10*3/uL — ABNORMAL HIGH (ref 0.1–1.0)
Monocytes Relative: 8 %
Neutro Abs: 19.1 10*3/uL — ABNORMAL HIGH (ref 1.7–7.7)
Neutrophils Relative %: 81 %
Platelets: 98 10*3/uL — ABNORMAL LOW (ref 150–400)
RBC: 5.27 MIL/uL (ref 4.22–5.81)
RDW: 14.9 % (ref 11.5–15.5)
WBC: 23.9 10*3/uL — ABNORMAL HIGH (ref 4.0–10.5)
nRBC: 0 % (ref 0.0–0.2)

## 2019-05-15 LAB — URINALYSIS, ROUTINE W REFLEX MICROSCOPIC
Bacteria, UA: NONE SEEN
Bilirubin Urine: NEGATIVE
Glucose, UA: NEGATIVE mg/dL
Ketones, ur: NEGATIVE mg/dL
Leukocytes,Ua: NEGATIVE
Nitrite: NEGATIVE
Protein, ur: NEGATIVE mg/dL
Specific Gravity, Urine: 1.044 — ABNORMAL HIGH (ref 1.005–1.030)
pH: 5 (ref 5.0–8.0)

## 2019-05-15 LAB — LACTIC ACID, PLASMA
Lactic Acid, Venous: 2.2 mmol/L (ref 0.5–1.9)
Lactic Acid, Venous: 2.1 mmol/L (ref 0.5–1.9)

## 2019-05-15 LAB — PROTIME-INR
INR: 1.3 — ABNORMAL HIGH (ref 0.8–1.2)
Prothrombin Time: 15.8 seconds — ABNORMAL HIGH (ref 11.4–15.2)

## 2019-05-15 LAB — SARS CORONAVIRUS 2 BY RT PCR (HOSPITAL ORDER, PERFORMED IN ~~LOC~~ HOSPITAL LAB): SARS Coronavirus 2: NEGATIVE

## 2019-05-15 LAB — APTT: aPTT: 29 seconds (ref 24–36)

## 2019-05-15 MED ORDER — INFLUENZA VAC SPLIT QUAD 0.5 ML IM SUSY: 0.5000 mL | PREFILLED_SYRINGE | INTRAMUSCULAR | Status: DC

## 2019-05-15 MED ORDER — KETOROLAC TROMETHAMINE 30 MG/ML IJ SOLN: 30.0000 mg | Freq: Four times a day (QID) | INTRAMUSCULAR | Status: DC | PRN

## 2019-05-15 MED ORDER — ONDANSETRON HCL 4 MG PO TABS: 4.0000 mg | ORAL_TABLET | Freq: Four times a day (QID) | ORAL | Status: DC | PRN

## 2019-05-15 MED ORDER — MIRTAZAPINE 15 MG PO TABS
15.0000 mg | ORAL_TABLET | Freq: Every day | ORAL | Status: DC
  Administered 2019-05-15 – 2019-05-17 (×3): 15 mg via ORAL
  Filled 2019-05-15 (×3): qty 1

## 2019-05-15 MED ORDER — PANTOPRAZOLE SODIUM 40 MG IV SOLR: 40.0000 mg | Freq: Once | INTRAVENOUS | Status: DC

## 2019-05-15 MED ORDER — ENOXAPARIN SODIUM 100 MG/ML ~~LOC~~ SOLN
100.0000 mg | SUBCUTANEOUS | Status: DC
  Administered 2019-05-15: 15:00:00 100 mg via SUBCUTANEOUS
  Filled 2019-05-15: qty 1

## 2019-05-15 MED ORDER — SODIUM CHLORIDE 0.9 % IV SOLN
INTRAVENOUS | Status: DC
  Administered 2019-05-15 – 2019-05-17 (×4): via INTRAVENOUS

## 2019-05-15 MED ORDER — ACETAMINOPHEN 325 MG PO TABS
650.0000 mg | ORAL_TABLET | Freq: Four times a day (QID) | ORAL | Status: DC | PRN
  Filled 2019-05-15 (×2): qty 2

## 2019-05-15 MED ORDER — ENOXAPARIN (LOVENOX) PATIENT EDUCATION KIT
PACK | Freq: Once | Status: AC
  Administered 2019-05-15: 15:00:00
  Filled 2019-05-15: qty 1

## 2019-05-15 MED ORDER — IOHEXOL 350 MG/ML SOLN
100.0000 mL | Freq: Once | INTRAVENOUS | Status: AC | PRN
  Administered 2019-05-15: 100 mL via INTRAVENOUS

## 2019-05-15 MED ORDER — BISACODYL 10 MG RE SUPP: 10.0000 mg | Freq: Every day | RECTAL | Status: DC | PRN

## 2019-05-15 MED ORDER — SODIUM CHLORIDE 0.9 % IV BOLUS
500.0000 mL | Freq: Once | INTRAVENOUS | Status: AC
  Administered 2019-05-15: 02:00:00 500 mL via INTRAVENOUS

## 2019-05-15 MED ORDER — HYDROMORPHONE HCL 1 MG/ML IJ SOLN
0.5000 mg | INTRAMUSCULAR | Status: DC | PRN
  Administered 2019-05-15 – 2019-05-20 (×18): 1 mg via INTRAVENOUS
  Filled 2019-05-15 (×21): qty 1

## 2019-05-15 MED ORDER — MORPHINE SULFATE (PF) 2 MG/ML IV SOLN
2.0000 mg | INTRAVENOUS | Status: DC | PRN
  Administered 2019-05-15 (×2): 4 mg via INTRAVENOUS
  Filled 2019-05-15 (×2): qty 2

## 2019-05-15 MED ORDER — MORPHINE SULFATE ER 30 MG PO TBCR
30.0000 mg | EXTENDED_RELEASE_TABLET | Freq: Two times a day (BID) | ORAL | Status: DC
  Administered 2019-05-15 (×2): 30 mg via ORAL
  Filled 2019-05-15 (×2): qty 1

## 2019-05-15 MED ORDER — HEPARIN (PORCINE) 25000 UT/250ML-% IV SOLN
1500.0000 [IU]/h | INTRAVENOUS | Status: AC
  Administered 2019-05-15: 1500 [IU]/h via INTRAVENOUS
  Filled 2019-05-15: qty 250

## 2019-05-15 MED ORDER — MORPHINE SULFATE (PF) 4 MG/ML IV SOLN
4.0000 mg | Freq: Once | INTRAVENOUS | Status: AC
  Administered 2019-05-15: 02:00:00 4 mg via INTRAVENOUS
  Filled 2019-05-15: qty 1

## 2019-05-15 MED ORDER — HEPARIN BOLUS VIA INFUSION
3000.0000 [IU] | Freq: Once | INTRAVENOUS | Status: AC
  Administered 2019-05-15: 06:00:00 3000 [IU] via INTRAVENOUS
  Filled 2019-05-15: qty 3000

## 2019-05-15 MED ORDER — ACETAMINOPHEN 650 MG RE SUPP: 650.0000 mg | Freq: Four times a day (QID) | RECTAL | Status: DC | PRN

## 2019-05-15 MED ORDER — MORPHINE SULFATE (PF) 4 MG/ML IV SOLN
4.0000 mg | Freq: Once | INTRAVENOUS | Status: AC
  Administered 2019-05-15: 06:00:00 4 mg via INTRAVENOUS
  Filled 2019-05-15: qty 1

## 2019-05-15 MED ORDER — METOCLOPRAMIDE HCL 5 MG/ML IJ SOLN: 10.0000 mg | Freq: Four times a day (QID) | INTRAMUSCULAR | Status: DC

## 2019-05-15 MED ORDER — SODIUM CHLORIDE 0.9% FLUSH
3.0000 mL | Freq: Two times a day (BID) | INTRAVENOUS | Status: DC
  Administered 2019-05-15 – 2019-05-22 (×13): 3 mL via INTRAVENOUS

## 2019-05-15 MED ORDER — POLYETHYLENE GLYCOL 3350 17 G PO PACK: 17.0000 g | PACK | Freq: Every day | ORAL | Status: DC

## 2019-05-15 MED ORDER — ONDANSETRON HCL 4 MG/2ML IJ SOLN: 4.0000 mg | Freq: Four times a day (QID) | INTRAMUSCULAR | Status: DC | PRN

## 2019-05-15 MED ORDER — MORPHINE SULFATE 15 MG PO TABS
15.0000 mg | ORAL_TABLET | Freq: Four times a day (QID) | ORAL | Status: DC | PRN
  Administered 2019-05-15: 18:00:00 15 mg via ORAL
  Filled 2019-05-15: qty 1

## 2019-05-15 MED ORDER — SENNOSIDES-DOCUSATE SODIUM 8.6-50 MG PO TABS
1.0000 | ORAL_TABLET | Freq: Two times a day (BID) | ORAL | Status: DC
  Administered 2019-05-15 – 2019-05-20 (×3): 1 via ORAL
  Filled 2019-05-15 (×6): qty 1

## 2019-05-15 NOTE — Progress Notes (Signed)
PT Cancellation Note  Patient Details Name: Justin Randall MRN: [id number] DOB: July 03, 1971   Cancelled Treatment:     Attempted assessment today however pt politely refused due to his pain in his thoracic region . Pt having difficulty moving out of left sidelying flexed position. Stating this position is the most comfortable. His wife states he has been preferring this position for last 1-2 weeks due to the pain. They both state that it is making his Left greater trochanter sore , and we tried various positions to see if we could decrease the pressure on this point.   Pt lives ith wife who works 9:00-3:00 , and then care for him. He has been mobile until last 2 weeks, She tried to walk him to the bathroom with his cane , but last few days very difficult due to his pain.   They will likely need RW and maybe a WC, and HHPT. Will complete full assessment when pt able.    Clide Dales 05/09/2019, 4:09 PM  Clide Dales, PT Acute Rehabilitation Services Pager: [phone] Office: [phone] 05/09/2019

## 2019-05-15 NOTE — Progress Notes (Signed)
   05/10/2019 1051  Vitals  Temp 98.4 F (36.9 C)  Temp Source Oral  BP 95/74  MAP (mmHg) 83  BP Location Right Arm  BP Method Automatic  Patient Position (if appropriate) Lying  Pulse Rate (!) 116  Pulse Rate Source Monitor  Resp 16  Oxygen Therapy  SpO2 98 %  O2 Device Nasal Cannula  O2 Flow Rate (L/min) 2 L/min  Height and Weight  Height 6\' 2"  (1.88 m)  Weight 64 kg  Type of Scale Used Bed  Type of Weight Actual  BSA (Calculated - sq m) 1.83 sq meters  BMI (Calculated) 18.1  Weight in (lb) to have BMI = 25 194.3  MEWS Score  MEWS RR 0  MEWS Pulse 2  MEWS Systolic 1  MEWS LOC 0  MEWS Temp 0  MEWS Score 3  MEWS Score Color Yellow   Pt MEWS yellow. Protocol initiated. MD aware of elevated HR. Pt currently monitored on telemetry. Will continue to monitor pt.

## 2019-05-15 NOTE — ED Notes (Signed)
Attempt at call report. Abby RN on floor verbalizes will call back.

## 2019-05-15 NOTE — Progress Notes (Signed)
Attempted exam, Patient unable to lay down properly for exam. Will try again once patient is feeling better.   Justin Randall 06/05/2019 11:44 AM

## 2019-05-15 NOTE — ED Triage Notes (Signed)
Per EMS, Pt called EMS for right 9/10 flank pain. Steadily increasing pain for the last 4 days. Pt has stage 4 lung cancer. Last radiation treatment was 05/11/2019. Pt has prescription for morphine, last took yesterday.

## 2019-05-15 NOTE — Progress Notes (Signed)
[place] for [facility] Indication: pulmonary embolus  Allergies  Allergen Reactions  . Fentanyl Nausea And Vomiting    Pt stopped med on his own -  Not feeling self  . Hydrocodone Other (See Comments)    Stays awake, cant sleep, decreased appetite  . Oxycodone     Nausea, "not myself"    Patient Measurements: Height: 6\' 2"  (188 cm) Weight: 162 lb (73.5 kg) IBW/kg (Calculated) : 82.2 Heparin Dosing Weight:   Vital Signs: Temp: 98.4 F (36.9 C) (10/10 1051) Temp Source: Oral (10/10 1051) BP: 95/74 (10/10 1051) Pulse Rate: 116 (10/10 1051)  Labs: Recent Labs    05/16/2019 0159  HGB 15.8  HCT 48.9  PLT 98*  APTT 29  LABPROT 15.8*  INR 1.3*  CREATININE 1.00    Estimated Creatinine Clearance: 93.9 mL/min (by C-G formula based on SCr of 1 mg/dL).   Medical History: Past Medical History:  Diagnosis Date  . Anxiety   . Cancer ([place])   . Sciatica   . Ulcer of the stomach and intestine     Medications:  Infusions:  . heparin 1,500 Units/hr (05/19/2019 0543)    Assessment: Patient with PE.  No oral anticoagulants noted on med rec.   Baseline coags ordered. 05/20/2019 Transition UFH> LMWH PLTC low at 98.  SCr WNL.   Goal of Therapy:  Heparin level 0.3-0.7 units/ml Monitor platelets by anticoagulation protocol: Yes   Plan:  Dc heparin drip at 1300 Start LMWH 100 mg sq q24 at [place], Pharm.D [phone] 05/14/2019 11:06 AM

## 2019-05-15 NOTE — ED Notes (Signed)
Date and time results received: 05/12/2019 3:26 AM  Test: Lactic Acid  Critical Value: 2.2  Name of Provider Notified: Atilano Median, PA

## 2019-05-15 NOTE — H&P (Signed)
[facility] History and Physical   Patient: Justin Randall JQB:[id number]   PCP: [name], MD DOB: 02-06-1971   DOA: 05/21/2019   DOS: 06/03/2019   DOS: the patient was seen and examined on 05/07/2019  Patient coming from: The patient is coming from Home  Chief Complaint: Chest pain and shortness of breath  HPI: Justin Randall is a 48 y.o. male with Past medical history of stage IV non-small cell lung cancer. Patient presents with complaints of right-sided chest pain ongoing for last 4 days.  Pain has been progressively getting worse and uncontrolled with his home pain medication. He also reports shortness of breath.  No fever no chills.  Occasional dry cough.  No nausea no vomiting.  No abdominal pain.  Reports constipation. No active bleeding reported before. Patient has metastasis to his right shoulder and for which she has been referred to radiation but is unable to attend the appointments secondary to severe pain and fatigue.  Patient is also unable to ambulate secondary to severe pain. Also reports decreased appetite and is not eating or drinking anything well.  Drinking Ensure occasionally. Unable to tolerate strong pain medications.  ED Course: Presents with chest pain.  CT PE protocol is positive for pulmonary embolism.  Patient was referred for admission for further work-up and pain control.  At his baseline ambulates without assistance but now mostly bedbound secondary to severe pain independent for most of his ADL;  manages his medication on his own.  Review of Systems: as mentioned in the history of present illness.  All other systems reviewed and are negative.  Past Medical History:  Diagnosis Date   Anxiety    Cancer ([place])    Sciatica    Ulcer of the stomach and intestine    Past Surgical History:  Procedure Laterality Date   IR US GUIDE BX ASP/DRAIN  04/08/2019   NO PAST SURGERIES     Social History:  reports that he has been smoking. He has a  30.00 pack-year smoking history. He has never used smokeless tobacco. He reports current drug use. Drug: Marijuana. He reports that he does not drink alcohol.  Allergies  Allergen Reactions   Fentanyl Nausea And Vomiting    Pt stopped med on his own -  Not feeling self   Hydrocodone Other (See Comments)    Stays awake, cant sleep, decreased appetite   Oxycodone     Nausea, "not myself"   Family history reviewed and not pertinent Family History  Problem Relation Age of Onset   Healthy Mother      Prior to Admission medications   Medication Sig Start Date End Date Taking? Authorizing Provider  morphine (MS CONTIN) 30 MG 12 hr tablet Take 1 tablet (30 mg total) by mouth every 12 (twelve) hours. 05/03/19  Yes Curt Bears, MD  morphine (MSIR) 15 MG tablet Take 1 tablet (15 mg total) by mouth every 6 (six) hours as needed for severe pain. 05/03/19  Yes Curt Bears, MD  fentaNYL (DURAGESIC) 25 MCG/HR Place 1 patch onto the skin every 3 (three) days. Rotate sites every application Patient not taking: Reported on 05/03/2019 05/02/19   Lauraine Rinne, NP  DULoxetine (CYMBALTA) 30 MG capsule Take 1 capsule (30 mg total) by mouth daily. 12/05/17 03/22/19  [name], MD    Physical Exam: Vitals:   05/14/2019 1208 05/30/2019 1334 05/16/2019 1424 05/25/2019 1712  BP: 97/73 (!) 125/104 90/72 107/79  Pulse: (!) 114 (!) 131 (!) 118 Marland Kitchen)  115  Resp: 20 16 14 12   Temp: 98.4 F (36.9 C) 98.8 F (37.1 C) 98.6 F (37 C) (!) 97.4 F (36.3 C)  TempSrc: Oral Oral Oral Oral  SpO2: 96% 98% 97% 94%  Weight:      Height:        General: alert and oriented to time, place, and person. Appear in marked distress, affect flat in affect Eyes: PERRL, Conjunctiva normal ENT: Oral Mucosa Clear, moist  Neck: no JVD, no Abnormal Mass Or lumps Cardiovascular: S1 and S2 Present, no Murmur, peripheral pulses symmetrical Respiratory: good respiratory effort, Bilateral Air entry equal and Decreased, no signs  of accessory muscle use, bilateral  Crackles, no wheezes Abdomen: Bowel Sound present, Soft and no tenderness, no hernia Skin: no rashes  Extremities: no Pedal edema, no calf tenderness Neurologic: without any new focal findings Gait not checked due to patient safety concerns  Data Reviewed: I have personally reviewed and interpreted labs, imaging as discussed below.  CBC: Recent Labs  Lab 05/09/2019 0159  WBC 23.9*  NEUTROABS 19.1*  HGB 15.8  HCT 48.9  MCV 92.8  PLT 98*   Basic Metabolic Panel: Recent Labs  Lab 05/12/2019 0159  NA 142  K 4.6  CL 102  CO2 24  GLUCOSE 103*  BUN 40*  CREATININE 1.00  CALCIUM 10.3   GFR: Estimated Creatinine Clearance: 81.8 mL/min (by C-G formula based on SCr of 1 mg/dL). Liver Function Tests: Recent Labs  Lab 05/11/2019 0159  AST 19  ALT 22  ALKPHOS 110  BILITOT 1.7*  PROT 7.6  ALBUMIN 3.3*   No results for input(s): LIPASE, AMYLASE in the last 168 hours. No results for input(s): AMMONIA in the last 168 hours. Coagulation Profile: Recent Labs  Lab 05/13/2019 0159  INR 1.3*   Cardiac Enzymes: No results for input(s): CKTOTAL, CKMB, CKMBINDEX, TROPONINI in the last 168 hours. BNP (last 3 results) No results for input(s): PROBNP in the last 8760 hours. HbA1C: No results for input(s): HGBA1C in the last 72 hours. CBG: No results for input(s): GLUCAP in the last 168 hours. Lipid Profile: No results for input(s): CHOL, HDL, LDLCALC, TRIG, CHOLHDL, LDLDIRECT in the last 72 hours. Thyroid Function Tests: No results for input(s): TSH, T4TOTAL, FREET4, T3FREE, THYROIDAB in the last 72 hours. Anemia Panel: No results for input(s): VITAMINB12, FOLATE, FERRITIN, TIBC, IRON, RETICCTPCT in the last 72 hours. Urine analysis:    Component Value Date/Time   COLORURINE YELLOW 05/13/2019 0141   APPEARANCEUR CLEAR 05/23/2019 0141   LABSPEC 1.044 (H) 05/07/2019 0141   PHURINE 5.0 05/24/2019 0141   GLUCOSEU NEGATIVE 05/25/2019 0141   HGBUR  MODERATE (A) 05/26/2019 0141   BILIRUBINUR NEGATIVE 05/10/2019 0141   KETONESUR NEGATIVE 05/14/2019 0141   PROTEINUR NEGATIVE 05/14/2019 0141   NITRITE NEGATIVE 06/03/2019 0141   LEUKOCYTESUR NEGATIVE 06/01/2019 0141    Radiological Exams on Admission: Ct Angio Chest Pe W And/or Wo Contrast  Result Date: 05/07/2019 CLINICAL DATA:  Per EMS, Pt called EMS for right 9/10 flank pain. Steadily increasing pain for the last 4 days. Pt has stage 4 lung cancer. Last radiation treatment was 05/11/2019. Pt has prescription for morphine, last took yesterday. Omni 350/.*comment was truncated*^172mL OMNIPAQUE IOHEXOL 350 MG/ML SOLNPE suspected, high pretest prob EXAM: CT ANGIOGRAPHY CHEST WITH CONTRAST TECHNIQUE: Multidetector CT imaging of the chest was performed using the standard protocol during bolus administration of intravenous contrast. Multiplanar CT image reconstructions and MIPs were obtained to evaluate the vascular anatomy. CONTRAST:  123mL OMNIPAQUE IOHEXOL 350 MG/ML SOLN COMPARISON:  04/20/2019, PET-CT FINDINGS: Cardiovascular: A tubular filling defect in the LEFT lower lobe pulmonary artery (image 57/4 consistent with thromboembolus. This is partially occlusive. Small embolus in the LEFT upper lobe pulmonary artery is partially wall adherent (image 37/4). No RIGHT ventricular strain evident. Mediastinum/Nodes: Bulky RIGHT axillary and RIGHT septic clavicular adenopathy again demonstrated. There is a mass of nodal tissue in the RIGHT supraclavicular region measuring 6.5 by 7.3 cm. This intense hypermetabolic on comparison PET scan. The nodal mass appears increased from roughly 5.4 x 5.4 cm. In the anterior mediastinum large prevascular lymph node measuring 3 cm similar. Small pericardial effusions increased. Lungs/Pleura: RIGHT upper lobe irregular mass measuring 3.6 cm not changed comparison exam. No pneumothorax. Pneumonia. Small focus of ground-glass density in the lateral aspect of the RIGHT middle  lobe. Paraseptal emphysema the upper lobes. Upper Abdomen: Limited view of the liver, kidneys, pancreas are unremarkable. Normal adrenal glands. Musculoskeletal: No clear aggressive osseous lesion. Review of the MIP images confirms the above findings. IMPRESSION: 1. Small pulmonary emboli in the LEFT lower lobe and LEFT upper lobe. Overall clot burden mild. 2. Stable RIGHT upper lobe mass. 3. Bulky RIGHT axillary and supraclavicular adenopathy. 4. Interval increase in size of RIGHT supraclavicular nodal mass. 5. Small pericardial effusion increased. Aortic Atherosclerosis (ICD10-I70.0) and Emphysema (ICD10-J43.9). Findings conveyed [facility] on 05/06/2019  at04:51. Electronically Signed   By: Suzy Bouchard M.D.   On: 05/09/2019 04:51   Dg Chest Port 1 View  Result Date: 05/12/2019 CLINICAL DATA:  Flank pain. History of lung cancer. EXAM: PORTABLE CHEST 1 VIEW COMPARISON:  03/22/2019 chest radiograph and 03/30/2019 chest CT FINDINGS: Right apical opacities are consistent with previously demonstrated cavitary mass. The lungs are otherwise clear. There is bullous emphysema apices. IMPRESSION: 1. No acute cardiopulmonary disease. 2. Unchanged appearance of the right apical cavitary mass. 3. Emphysema. Electronically Signed   By: Ulyses Jarred M.D.   On: 05/31/2019 02:34   EKG: Independently reviewed. normal sinus rhythm, nonspecific ST and T waves changes, S1Q3T3 pattern.  I reviewed all nursing notes, pharmacy notes, vitals, pertinent old records.  Assessment/Plan 1. Pulmonary emboli (HCC) Presents with pulmonary embolism.  Started on IV heparin. Currently PE is not large.  No evidence of right heart strain.  Will transition him to Lovenox based on my discussion with the patient.  We will provide Lovenox education.  Monitor in the hospital.  Lower extremity Doppler ordered.  2.  Pain control secondary to cancer. 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  Pain has been excruciating and uncontrolled with his home pain regimen. Patient has not been able to tolerate stronger pain regimen. Referred to radiation oncology for radiation of the shoulder and left supraclavicular mass but unable to reach to the office due to severe pain. Anticipating that there aggressive pain regimen in the hospital will improve patient's ability to go to the outpatient Currently have not discussed with radiation oncology.  3.  Poor p.o. intake. Constipation. Stool softener as well as MiraLAX. Remeron ordered.  4.  Pressure injury of the skin. Frequent position change.  Nutrition: Regular diet DVT Prophylaxis: Therapeutic Anticoagulation with Lovenox  Advance goals of care discussion: Full code   Consults: none   Family Communication: no family was present at bedside, at the time of interview.  Discussed with wife on the phone, Opportunity was given to ask question and all questions were answered satisfactorily.  Disposition: Admitted  as observation, telemetry unit. Likely to be discharged home, in 1-2 days.  I have discussed plan of care as described above with RN and patient/family.   Author: Berle Mull, MD [facility] 06/03/2019 6:15 PM   To reach On-call, see care teams to locate the attending and reach out to them via [email or web address]. If 7PM-7AM, please contact night-coverage If you still have difficulty reaching the attending provider, please page the [facility] (Director on Call) for [facility] on amion for assistance.

## 2019-05-15 NOTE — Progress Notes (Signed)
ANTICOAGULATION CONSULT NOTE - Initial Consult  Pharmacy Consult for Heparin Indication: pulmonary embolus  Allergies  Allergen Reactions  . Fentanyl Nausea And Vomiting    Pt stopped med on his own -  Not feeling self  . Hydrocodone Other (See Comments)    Stays awake, cant sleep, decreased appetite  . Oxycodone     Nausea, "not myself"    Patient Measurements: Height: 6\' 2"  (188 cm) Weight: 162 lb (73.5 kg) IBW/kg (Calculated) : 82.2 Heparin Dosing Weight:   Vital Signs: Temp: 98.6 F (37 C) (10/10 0018) Temp Source: Oral (10/10 0018) BP: 119/91 (10/10 0400) Pulse Rate: 113 (10/10 0400)  Labs: Recent Labs    06/05/2019 0159  HGB 15.8  HCT 48.9  PLT 98*  APTT 29  LABPROT 15.8*  INR 1.3*  CREATININE 1.00    Estimated Creatinine Clearance: 93.9 mL/min (by C-G formula based on SCr of 1 mg/dL).   Medical History: Past Medical History:  Diagnosis Date  . Anxiety   . Cancer ([place])   . Sciatica   . Ulcer of the stomach and intestine     Medications:  Infusions:  . heparin      Assessment: Patient with PE.  No oral anticoagulants noted on med rec.   Baseline coags ordered.  Goal of Therapy:  Heparin level 0.3-0.7 units/ml Monitor platelets by anticoagulation protocol: Yes   Plan:  Heparin bolus 3000 units iv x1 Heparin drip at 1500 units/hr Daily CBC Next heparin level at [place], Shea Stakes [place] 05/26/2019,5:29 AM

## 2019-05-15 NOTE — ED Provider Notes (Signed)
[facility] DEPT Provider Note   CSN: [id number] Arrival date & time: 06/01/2019  0011     History   Chief Complaint No chief complaint on file.   HPI Nikia Mangino is a 48 y.o. 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 Patient states the pain is only present when he coughs. Patient's wife is at bedside and states patient recently developed a worsening, productive cough. Patient has chronic pain due to his lung cancer and takes morphine daily. His last morphine was at 7pm. Patient also notes redness and irritation to site of radiation. Patient recently had radiation therapy to right upper chest and notes increased irritation to site. Irritation is associated with drainage and increased erythema. Patient's wife notes patient has repeatedly canceled radiation appointments due to feeling unwell. Patient has been bed bound for the past few weeks due to increased fatigue. Patient's wife is his sole caretaker at home. Patient admits to decreased appetite where he is on a liquid diet due to difficulties swallowing in the past. Patient has not had any chemotherapy or immunotherpy yet. Patient denies fever, chills, chest pain, and worsening shortness of breath.   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   Encounter for antineoplastic immunotherapy 05/03/2019   Cancer related pain 04/27/2019   Encounter for antineoplastic chemotherapy 04/15/2019   Goals of care, counseling/discussion 04/15/2019   Adenocarcinoma, lung, right ([place]) 04/13/2019   Tobacco dependence 12/05/2017   Depression 12/05/2017    Marijuana use, episodic 12/05/2017   Chronic right-sided low back pain with sciatica 09/11/2016    Past Surgical History:  Procedure Laterality Date   IR US GUIDE BX ASP/DRAIN  04/08/2019   NO PAST SURGERIES          Home Medications    Prior to Admission medications   Medication Sig Start Date End Date Taking? Authorizing Provider  morphine (MS CONTIN) 30 MG 12 hr tablet Take 1 tablet (30 mg total) by mouth every 12 (twelve) hours. 05/03/19  Yes Curt Bears, MD  morphine (MSIR) 15 MG tablet Take 1 tablet (15 mg total) by mouth every 6 (six) hours as needed for severe pain. 05/03/19  Yes Curt Bears, MD  fentaNYL (DURAGESIC) 25 MCG/HR Place 1 patch onto the skin every 3 (three) days. Rotate sites every application Patient not taking: Reported on 05/03/2019 05/02/19   Lauraine Rinne, NP  ibuprofen (ADVIL) 600 MG tablet Take 1 tablet (600 mg total) by mouth every 6 (six) hours as needed. Patient not taking: Reported on 05/16/2019 03/22/19   Wieters, Hallie C, PA-C  prochlorperazine (COMPAZINE) 10 MG tablet TAKE 1 TABLET(10 MG) BY MOUTH EVERY 6 HOURS AS NEEDED FOR NAUSEA OR VOMITING Patient not taking: No sig reported 04/22/19   Curt Bears, MD  tiZANidine (ZANAFLEX) 4 MG tablet TAKE 1 TABLET(4 MG) BY MOUTH EVERY 8 HOURS AS NEEDED FOR MUSCLE SPASMS Patient not taking: Reported on 06/05/2019 04/27/19   Lauraine Rinne, NP  DULoxetine (CYMBALTA) 30 MG capsule Take 1 capsule (30 mg total) by mouth daily. 12/05/17 03/22/19  [name], MD  Family History Family History  Problem Relation Age of Onset   Healthy Mother     Social History Social History   Tobacco Use   Smoking status: Current Every Day Smoker    Packs/day: 1.00    Years: 30.00    Pack years: 30.00   Smokeless tobacco: Never Used  Substance Use Topics   Alcohol use: No   Drug use: Yes    Types: Marijuana     Allergies   Fentanyl, Hydrocodone, and Oxycodone   Review of Systems Review  of Systems  Constitutional: Positive for appetite change and fatigue. Negative for chills and fever.  Respiratory: Positive for cough (chronic cough due to stage 4 lung cancer). Negative for shortness of breath.   Cardiovascular: Negative for chest pain and palpitations.  Musculoskeletal: Positive for myalgias (pain in right lower rib region).  Skin: Positive for color change.  Neurological: Positive for weakness.  All other systems reviewed and are negative.    Physical Exam Updated Vital Signs BP 112/90 (BP Location: Left Arm)    Pulse (!) 124    Temp 98.6 F (37 C) (Oral)    Resp 20    Ht 6\' 2"  (1.88 m)    Wt 73.5 kg    SpO2 94%    BMI 20.80 kg/m   Physical Exam Vitals signs and nursing note reviewed.  Constitutional:      General: He is not in acute distress. HENT:     Head: Normocephalic.  Eyes:     Pupils: Pupils are equal, round, and reactive to light.  Neck:     Musculoskeletal: Neck supple.  Cardiovascular:     Rate and Rhythm: Regular rhythm. Tachycardia present.     Pulses: Normal pulses.     Heart sounds: Normal heart sounds. No murmur. No friction rub. No gallop.   Pulmonary:     Breath sounds: Normal breath sounds.     Comments: Increased work for breathing. Patient placed on 2L nasal canula Abdominal:     General: Abdomen is flat. Bowel sounds are normal. There is no distension.     Palpations: Abdomen is soft.  Musculoskeletal: Normal range of motion.     Comments: Reproducible tenderness over lower right ribs. No erythema or signs of infection. No deformity. No crepitus.  Skin:    General: Skin is warm and dry.     Comments: Numerous nodules on right clavicular region with overlying erythema and white drainage. Erythematous pressure sore on left hip  Neurological:     General: No focal deficit present.     Mental Status: He is oriented to person, place, and time.      ED Treatments / Results  Labs (all labs ordered are listed, but only abnormal  results are displayed) Labs Reviewed  COMPREHENSIVE METABOLIC PANEL - Abnormal; Notable for the following components:      Result Value   Glucose, Bld 103 (*)    BUN 40 (*)    Albumin 3.3 (*)    Total Bilirubin 1.7 (*)    Anion gap 16 (*)    All other components within normal limits  CBC WITH DIFFERENTIAL/PLATELET - Abnormal; Notable for the following components:   WBC 23.9 (*)    Platelets 98 (*)    Neutro Abs 19.1 (*)    Monocytes Absolute 2.0 (*)    Abs Immature Granulocytes 0.28 (*)    All other components within normal limits  LACTIC ACID, PLASMA - Abnormal; Notable for the following  components:   Lactic Acid, Venous 2.2 (*)    All other components within normal limits  PROTIME-INR - Abnormal; Notable for the following components:   Prothrombin Time 15.8 (*)    INR 1.3 (*)    All other components within normal limits  URINALYSIS, ROUTINE W REFLEX MICROSCOPIC - Abnormal; Notable for the following components:   Specific Gravity, Urine 1.044 (*)    Hgb urine dipstick MODERATE (*)    All other components within normal limits  SARS CORONAVIRUS 2 BY RT PCR (HOSPITAL ORDER, [facility])  CULTURE, BLOOD (ROUTINE X 2)  CULTURE, BLOOD (ROUTINE X 2)  URINE CULTURE  APTT  LACTIC ACID, PLASMA  HEPARIN LEVEL (UNFRACTIONATED)    EKG None  Radiology Ct Angio Chest Pe W And/or Wo Contrast  Result Date: 05/20/2019 CLINICAL DATA:  Per EMS, Pt called EMS for right 9/10 flank pain. Steadily increasing pain for the last 4 days. Pt has stage 4 lung cancer. Last radiation treatment was 05/11/2019. Pt has prescription for morphine, last took yesterday. Omni 350/.*comment was truncated*^170mL OMNIPAQUE IOHEXOL 350 MG/ML SOLNPE suspected, high pretest prob EXAM: CT ANGIOGRAPHY CHEST WITH CONTRAST TECHNIQUE: Multidetector CT imaging of the chest was performed using the standard protocol during bolus administration of intravenous contrast. Multiplanar CT image  reconstructions and MIPs were obtained to evaluate the vascular anatomy. CONTRAST:  130mL OMNIPAQUE IOHEXOL 350 MG/ML SOLN COMPARISON:  04/20/2019, PET-CT FINDINGS: Cardiovascular: A tubular filling defect in the LEFT lower lobe pulmonary artery (image 57/4 consistent with thromboembolus. This is partially occlusive. Small embolus in the LEFT upper lobe pulmonary artery is partially wall adherent (image 37/4). No RIGHT ventricular strain evident. Mediastinum/Nodes: Bulky RIGHT axillary and RIGHT septic clavicular adenopathy again demonstrated. There is a mass of nodal tissue in the RIGHT supraclavicular region measuring 6.5 by 7.3 cm. This intense hypermetabolic on comparison PET scan. The nodal mass appears increased from roughly 5.4 x 5.4 cm. In the anterior mediastinum large prevascular lymph node measuring 3 cm similar. Small pericardial effusions increased. Lungs/Pleura: RIGHT upper lobe irregular mass measuring 3.6 cm not changed comparison exam. No pneumothorax. Pneumonia. Small focus of ground-glass density in the lateral aspect of the RIGHT middle lobe. Paraseptal emphysema the upper lobes. Upper Abdomen: Limited view of the liver, kidneys, pancreas are unremarkable. Normal adrenal glands. Musculoskeletal: No clear aggressive osseous lesion. Review of the MIP images confirms the above findings. IMPRESSION: 1. Small pulmonary emboli in the LEFT lower lobe and LEFT upper lobe. Overall clot burden mild. 2. Stable RIGHT upper lobe mass. 3. Bulky RIGHT axillary and supraclavicular adenopathy. 4. Interval increase in size of RIGHT supraclavicular nodal mass. 5. Small pericardial effusion increased. Aortic Atherosclerosis (ICD10-I70.0) and Emphysema (ICD10-J43.9). Findings conveyed [facility] on 05/30/2019  at04:51. Electronically Signed   By: Suzy Bouchard M.D.   On: 05/10/2019 04:51   Dg Chest Port 1 View  Result Date: 05/14/2019 CLINICAL DATA:  Flank pain. History of lung cancer. EXAM:  PORTABLE CHEST 1 VIEW COMPARISON:  03/22/2019 chest radiograph and 03/30/2019 chest CT FINDINGS: Right apical opacities are consistent with previously demonstrated cavitary mass. The lungs are otherwise clear. There is bullous emphysema apices. IMPRESSION: 1. No acute cardiopulmonary disease. 2. Unchanged appearance of the right apical cavitary mass. 3. Emphysema. Electronically Signed   By: Ulyses Jarred M.D.   On: 05/29/2019 02:34    Procedures .Critical Care Performed by: Jonette Eva, PA-C Authorized by: Jonette Eva, PA-C   Critical care provider statement:  Critical care time (minutes):  45   Critical care time was exclusive of:  Separately billable procedures and treating other patients and teaching time   Critical care was necessary to treat or prevent imminent or life-threatening deterioration of the following conditions:  Respiratory failure   Critical care was time spent personally by me on the following activities:  Discussions with consultants, evaluation of patient's response to treatment, examination of patient, ordering and performing treatments and interventions, ordering and review of laboratory studies, ordering and review of radiographic studies, pulse oximetry, re-evaluation of patient's condition, obtaining history from patient or surrogate and review of old charts   I assumed direction of critical care for this patient from another provider in my specialty: no     (including critical care time)  Medications Ordered in ED Medications  heparin ADULT infusion 100 units/mL (25000 units/239mL sodium chloride 0.45%) (1,500 Units/hr Intravenous New Bag/Given 05/26/2019 0543)  morphine 4 MG/ML injection 4 mg (4 mg Intravenous Given 06/01/2019 0158)  sodium chloride 0.9 % bolus 500 mL (500 mLs Intravenous New Bag/Given 05/19/2019 0201)  iohexol (OMNIPAQUE) 350 MG/ML injection 100 mL (100 mLs Intravenous Contrast Given 05/07/2019 0343)  heparin bolus via infusion 3,000 Units  (3,000 Units Intravenous Bolus from Bag 06/02/2019 0545)  morphine 4 MG/ML injection 4 mg (4 mg Intravenous Given 06/02/2019 0545)     Initial Impression / Assessment and Plan / ED Course   Clinical Course as of May 14 709  Sat May 15, 2019  0202 Patient presents the emergency department with complaints of increasing generalized weakness, new onset cough and worsening right-sided chest pain.  He has a recent diagnosis of lung cancer in September 2020 and has just recently begun treatment.  No chemotherapy or immunotherapy yet.  Patient was scheduled for daily palliative radiation.  Per the chart, it appears he went to radiation on 05/06/2019 but has not returned.  Wife at bedside reports that patient has been canceling appointments due to feeling unwell.  She reports that he has not gotten out of bed for almost 1 week and when he does he is very unsteady.  She reports that he has minimal appetite and has appeared more and more ill.  Additionally she reports that the skin changes on his right neck and shoulder seem to be more "raw" than they have been in the past.   [HM]  0203 Patient presents with significant tachycardia and hypoxia.  89% on room air at rest.  He is requiring 2 L of oxygen to maintain his oxygen saturations at this time.  He has not been on oxygen at home.  Pulse Rate(!): 124 [HM]  0520 Discussed with radiology.  Patient has 2 pulmonary emboli in the left lung.  Additionally he has large mass and metastasis to the right shoulder region.  No CT evidence of infection at this time.  CT Angio Chest PE W and/or Wo Contrast [HM]  0605 Discussed with Dr. Linda Hedges who will admit. Pt continues to have pain will redose morphine   [HM]    Clinical Course User Index [HM] Muthersbaugh, Gwenlyn Perking    I have reviewed the triage vital signs and the nursing notes.  Pertinent labs & imaging results that were available during my care of the patient were reviewed by me and considered in my medical  decision making (see chart for details).  Tiyon Sanor is a 48 year old male who presents to the ED for evaluation of right-sided rib pain that is only present when  the patient coughs.  Patient has a history of stage IV lung cancer.  He had his first radiation on 10/6.  Upon arrival, patient is tachycardic at 124, O2 saturation 92%, and afebrile. On physical exam, patient has numerous nodules in right clavicular region with visible drainage and overlying erythema.  I have reviewed the previous records and it shows that the nodules have slightly worsened since last visit. Lungs clear to auscultation bilaterally. Will obtain labs, give fluids, and pain medication. Will also obtain chest x-ray.  Patient quickly became hypoxic at 89%. Will place patient on 2L nasal canula. Patient's new need for oxygen is concerning-suspicious for PE due to patient's recent cancer diagnosis and immobility. Will order CTA of chest.   Leukocytosis of 23.9, lactate 2.2. Jarrett Soho Muthersbaugh, PA-C received call about 2 pulmonary emboli. Will start Heparin. Radiology also noted a large mass and METS to the right shoulder region with no signs of infection. Patient will be admitted for PE treatment and new hypoxia. Will order admission orders. Jarrett Soho Muthersbaugh, PA-C discussed case with triad hospitalist Dr. Linda Hedges to admit patient.   Final Clinical Impressions(s) / ED Diagnoses   Final diagnoses:  Multiple subsegmental pulmonary emboli without acute cor pulmonale [facility])  Generalized weakness  Tachycardia  Hypoxia    ED Discharge Orders    None       Romie Levee 09/16/15 [id number]    Delora Fuel, MD 01/41/03 2333

## 2019-05-16 ENCOUNTER — Observation Stay (HOSPITAL_COMMUNITY): Payer: Medicare Other

## 2019-05-16 DIAGNOSIS — C7951 Secondary malignant neoplasm of bone: Secondary | ICD-10-CM | POA: Diagnosis present

## 2019-05-16 DIAGNOSIS — Z9221 Personal history of antineoplastic chemotherapy: Secondary | ICD-10-CM | POA: Diagnosis not present

## 2019-05-16 DIAGNOSIS — Z7189 Other specified counseling: Secondary | ICD-10-CM

## 2019-05-16 DIAGNOSIS — E43 Unspecified severe protein-calorie malnutrition: Secondary | ICD-10-CM | POA: Diagnosis present

## 2019-05-16 DIAGNOSIS — J9601 Acute respiratory failure with hypoxia: Secondary | ICD-10-CM | POA: Diagnosis present

## 2019-05-16 DIAGNOSIS — E860 Dehydration: Secondary | ICD-10-CM | POA: Diagnosis present

## 2019-05-16 DIAGNOSIS — L89221 Pressure ulcer of left hip, stage 1: Secondary | ICD-10-CM | POA: Diagnosis present

## 2019-05-16 DIAGNOSIS — J690 Pneumonitis due to inhalation of food and vomit: Secondary | ICD-10-CM | POA: Diagnosis not present

## 2019-05-16 DIAGNOSIS — C792 Secondary malignant neoplasm of skin: Secondary | ICD-10-CM | POA: Diagnosis present

## 2019-05-16 DIAGNOSIS — R531 Weakness: Secondary | ICD-10-CM | POA: Diagnosis not present

## 2019-05-16 DIAGNOSIS — Z681 Body mass index (BMI) 19 or less, adult: Secondary | ICD-10-CM | POA: Diagnosis not present

## 2019-05-16 DIAGNOSIS — G893 Neoplasm related pain (acute) (chronic): Secondary | ICD-10-CM | POA: Diagnosis present

## 2019-05-16 DIAGNOSIS — Z923 Personal history of irradiation: Secondary | ICD-10-CM | POA: Diagnosis not present

## 2019-05-16 DIAGNOSIS — K5900 Constipation, unspecified: Secondary | ICD-10-CM | POA: Diagnosis present

## 2019-05-16 DIAGNOSIS — Z7401 Bed confinement status: Secondary | ICD-10-CM | POA: Diagnosis not present

## 2019-05-16 DIAGNOSIS — I2693 Single subsegmental pulmonary embolism without acute cor pulmonale: Secondary | ICD-10-CM | POA: Diagnosis not present

## 2019-05-16 DIAGNOSIS — Z66 Do not resuscitate: Secondary | ICD-10-CM | POA: Diagnosis not present

## 2019-05-16 DIAGNOSIS — I2609 Other pulmonary embolism with acute cor pulmonale: Secondary | ICD-10-CM | POA: Diagnosis not present

## 2019-05-16 DIAGNOSIS — I2694 Multiple subsegmental pulmonary emboli without acute cor pulmonale: Secondary | ICD-10-CM | POA: Diagnosis present

## 2019-05-16 DIAGNOSIS — G9341 Metabolic encephalopathy: Secondary | ICD-10-CM | POA: Diagnosis not present

## 2019-05-16 DIAGNOSIS — E872 Acidosis: Secondary | ICD-10-CM | POA: Diagnosis present

## 2019-05-16 DIAGNOSIS — Z20828 Contact with and (suspected) exposure to other viral communicable diseases: Secondary | ICD-10-CM | POA: Diagnosis present

## 2019-05-16 DIAGNOSIS — R609 Edema, unspecified: Secondary | ICD-10-CM | POA: Diagnosis not present

## 2019-05-16 DIAGNOSIS — Z515 Encounter for palliative care: Secondary | ICD-10-CM | POA: Diagnosis not present

## 2019-05-16 DIAGNOSIS — J438 Other emphysema: Secondary | ICD-10-CM | POA: Diagnosis present

## 2019-05-16 DIAGNOSIS — R627 Adult failure to thrive: Secondary | ICD-10-CM | POA: Diagnosis present

## 2019-05-16 DIAGNOSIS — I2699 Other pulmonary embolism without acute cor pulmonale: Secondary | ICD-10-CM | POA: Diagnosis not present

## 2019-05-16 DIAGNOSIS — C3491 Malignant neoplasm of unspecified part of right bronchus or lung: Secondary | ICD-10-CM | POA: Diagnosis not present

## 2019-05-16 DIAGNOSIS — J439 Emphysema, unspecified: Secondary | ICD-10-CM | POA: Diagnosis present

## 2019-05-16 DIAGNOSIS — F17210 Nicotine dependence, cigarettes, uncomplicated: Secondary | ICD-10-CM | POA: Diagnosis present

## 2019-05-16 DIAGNOSIS — F419 Anxiety disorder, unspecified: Secondary | ICD-10-CM | POA: Diagnosis present

## 2019-05-16 DIAGNOSIS — F329 Major depressive disorder, single episode, unspecified: Secondary | ICD-10-CM | POA: Diagnosis present

## 2019-05-16 DIAGNOSIS — C3411 Malignant neoplasm of upper lobe, right bronchus or lung: Secondary | ICD-10-CM | POA: Diagnosis present

## 2019-05-16 LAB — COMPREHENSIVE METABOLIC PANEL
ALT: 25 U/L (ref 0–44)
AST: 35 U/L (ref 15–41)
Albumin: 2.7 g/dL — ABNORMAL LOW (ref 3.5–5.0)
Alkaline Phosphatase: 102 U/L (ref 38–126)
Anion gap: 15 (ref 5–15)
BUN: 29 mg/dL — ABNORMAL HIGH (ref 6–20)
CO2: 22 mmol/L (ref 22–32)
Calcium: 9.2 mg/dL (ref 8.9–10.3)
Chloride: 109 mmol/L (ref 98–111)
Creatinine, Ser: 0.99 mg/dL (ref 0.61–1.24)
GFR calc Af Amer: >60 mL/min (ref >60)
GFR calc non Af Amer: >60 mL/min (ref >60)
Glucose, Bld: 66 mg/dL — ABNORMAL LOW (ref 70–99)
Potassium: 5.9 mmol/L — ABNORMAL HIGH (ref 3.5–5.1)
Sodium: 146 mmol/L — ABNORMAL HIGH (ref 135–145)
Total Bilirubin: 3.1 mg/dL — ABNORMAL HIGH (ref 0.3–1.2)
Total Protein: 6.5 g/dL (ref 6.5–8.1)

## 2019-05-16 LAB — URINE CULTURE: Culture: NO GROWTH

## 2019-05-16 LAB — CBC
HCT: 45.3 % (ref 39.0–52.0)
Hemoglobin: 14.3 g/dL (ref 13.0–17.0)
MCH: 30 pg (ref 26.0–34.0)
MCHC: 31.6 g/dL (ref 30.0–36.0)
MCV: 95 fL (ref 80.0–100.0)
Platelets: 114 10*3/uL — ABNORMAL LOW (ref 150–400)
RBC: 4.77 MIL/uL (ref 4.22–5.81)
RDW: 15.6 % — ABNORMAL HIGH (ref 11.5–15.5)
WBC: 20.1 10*3/uL — ABNORMAL HIGH (ref 4.0–10.5)
nRBC: 0 % (ref 0.0–0.2)

## 2019-05-16 LAB — MRSA PCR SCREENING: MRSA by PCR: NEGATIVE

## 2019-05-16 LAB — HIV ANTIBODY (ROUTINE TESTING W REFLEX): HIV Screen 4th Generation wRfx: NONREACTIVE

## 2019-05-16 MED ORDER — DEXAMETHASONE SODIUM PHOSPHATE 10 MG/ML IJ SOLN
8.0000 mg | INTRAMUSCULAR | Status: DC
  Administered 2019-05-16 – 2019-05-19 (×4): 8 mg via INTRAVENOUS
  Filled 2019-05-16: qty 0.8
  Filled 2019-05-16 (×3): qty 1

## 2019-05-16 MED ORDER — CHLORHEXIDINE GLUCONATE CLOTH 2 % EX PADS
6.0000 | MEDICATED_PAD | Freq: Every day | CUTANEOUS | Status: DC
  Administered 2019-05-16 – 2019-05-20 (×5): 6 via TOPICAL

## 2019-05-16 MED ORDER — LORAZEPAM 2 MG/ML IJ SOLN
0.5000 mg | INTRAMUSCULAR | Status: DC | PRN
  Administered 2019-05-21: 0.5 mg via INTRAVENOUS
  Filled 2019-05-16: qty 1

## 2019-05-16 MED ORDER — METOPROLOL TARTRATE 5 MG/5ML IV SOLN
2.5000 mg | INTRAVENOUS | Status: DC | PRN
  Administered 2019-05-16 – 2019-05-20 (×5): 2.5 mg via INTRAVENOUS
  Filled 2019-05-16 (×5): qty 5

## 2019-05-16 MED ORDER — LORAZEPAM 2 MG/ML IJ SOLN: 0.5000 mg | Freq: Once | INTRAMUSCULAR | Status: DC

## 2019-05-16 MED ORDER — PRO-STAT SUGAR FREE PO LIQD
30.0000 mL | Freq: Two times a day (BID) | ORAL | Status: DC
  Administered 2019-05-16: 30 mL via ORAL
  Filled 2019-05-16 (×2): qty 30

## 2019-05-16 MED ORDER — METOPROLOL TARTRATE 5 MG/5ML IV SOLN: 2.5000 mg | Freq: Four times a day (QID) | INTRAVENOUS | Status: DC

## 2019-05-16 MED ORDER — ENOXAPARIN SODIUM 100 MG/ML ~~LOC~~ SOLN
100.0000 mg | SUBCUTANEOUS | Status: DC
  Administered 2019-05-16 – 2019-05-20 (×5): 100 mg via SUBCUTANEOUS
  Filled 2019-05-16 (×6): qty 1

## 2019-05-16 MED ORDER — MORPHINE SULFATE (PF) 2 MG/ML IV SOLN
2.0000 mg | INTRAVENOUS | Status: DC | PRN
  Administered 2019-05-16 – 2019-05-17 (×6): 4 mg via INTRAVENOUS
  Administered 2019-05-17: 2 mg via INTRAVENOUS
  Administered 2019-05-17: 4 mg via INTRAVENOUS
  Filled 2019-05-16 (×8): qty 2

## 2019-05-16 MED ORDER — SODIUM CHLORIDE 0.9 % IV BOLUS
1000.0000 mL | Freq: Once | INTRAVENOUS | Status: AC
  Administered 2019-05-16: 1000 mL via INTRAVENOUS

## 2019-05-16 MED ORDER — BOOST / RESOURCE BREEZE PO LIQD CUSTOM
1.0000 | Freq: Two times a day (BID) | ORAL | Status: DC
  Administered 2019-05-16 – 2019-05-19 (×4): 1 via ORAL

## 2019-05-16 MED ORDER — ADULT MULTIVITAMIN W/MINERALS CH
1.0000 | ORAL_TABLET | Freq: Every day | ORAL | Status: DC
  Administered 2019-05-16: 1 via ORAL
  Filled 2019-05-16: qty 1

## 2019-05-16 MED ORDER — MORPHINE SULFATE (PF) 2 MG/ML IV SOLN
2.0000 mg | Freq: Once | INTRAVENOUS | Status: AC
  Administered 2019-05-16: 2 mg via INTRAVENOUS
  Filled 2019-05-16: qty 1

## 2019-05-16 MED ORDER — ORAL CARE MOUTH RINSE
15.0000 mL | Freq: Two times a day (BID) | OROMUCOSAL | Status: DC
  Administered 2019-05-16 – 2019-05-22 (×11): 15 mL via OROMUCOSAL

## 2019-05-16 NOTE — Progress Notes (Signed)
Placed pt on NRB for sats in the 80s. Spoke with RN she is paging md.

## 2019-05-16 NOTE — Consult Note (Signed)
Consultation Note Date: 05/16/2019   Patient Name: Justin Randall  DOB: 24-Apr-1971  MRN: [id number]  Age / Sex: 48 y.o., male  PCP: [name], MD Referring Physician: Mendel Corning, MD  Reason for Consultation: Establishing goals of care  HPI/Patient Profile: 48 y.o. male  with past medical history of metastatic stage IV non-small cell lung cancer who follows with Dr. Earlie Server admitted on 05/17/2019 with right-sided chest pain.  He has had significant pain for the past several weeks and has been mostly bedbound.  This is also prevented him from making it to several appointments.  On arrival, CT angio revealed pulmonary embolism.  This morning, he is suffered from hypoxic respiratory failure and rapid response was called.  Palliative consulted for goals of care.  Clinical Assessment and Goals of Care: I met today with patient and his wife.   We discussed clinical course as well as wishes moving forward in regard to advanced directives.  Concepts specific to code status and care plan this hospitalization discussed.  Values and goals of care important to patient and family were attempted to be elicited.  Mr. Domingo appears to be limited in his ability to understand and process his current situation.  I had a long discussion with his wife both on the phone and after she arrived to the hospital regarding care plan moving forward.  She understands that he is critically ill and there is high likelihood that he may continue to decompensate and this may eventually lead to his death.  We discussed plan for continuation of current interventions as well as limitations of no heroic measures in the event of cardiac or respiratory arrest.  Plan to reassess his situation in 24 to 48 hours.  I began initial conversation with her regarding that we might be approaching a point where there should be consideration for transition  to full comfort care.  Concept of Hospice and Palliative Care were discussed  Questions and concerns addressed.   PMT will continue to support holistically.   SUMMARY OF RECOMMENDATIONS - DNR/DNI. - Plan to continue current interventions and reassess his situation in 24 to 48 hours.  I had detailed discussion with his wife regarding how concerned I am that he will continue to decline.  We did discuss consideration for transition to comfort care if no improvement. -Also discussed plan for aggressive treatment of his pain and shortness of breath.  Agree with plan for morphine 2 to 4 mg every 2 hours as needed.  Also could try Dilaudid if this is ineffective. -Discussed with patient and then with his wife at bedside.  -His wife is inquiring about other family coming to visit.  We discussed current visitation restrictions in light of Covid pandemic.  If, however, his condition continues to deteriorate, I would recommend visitation exception per visitor exception policy if it does appear that he is in fact approaching end-of-life.  Code Status/Advance Care Planning:  DNR- We discussed the patient and his wife regarding heroic interventions at the end-of-life and they  agree this would not be in line with prior expressed wishes for a natural death or be likely to lead to getting well enough to go back home. They were in agreement with changing CODE STATUS of DO NOT RESUSCITATE.  Palliative Prophylaxis:   Aspiration and Oral Care  Prognosis:   Guarded  Discharge Planning: To Be Determined      Primary Diagnoses: Present on Admission: . Pulmonary emboli ([place]) . Tobacco dependence . Depression . Cancer related pain . Adenocarcinoma, lung, right ([place]) . Acute respiratory failure with hypoxia ([place])   I have reviewed the medical record, interviewed the patient and family, and examined the patient. The following aspects are pertinent.  Past Medical History:  Diagnosis Date  . Anxiety    . Cancer ([place])   . Sciatica   . Ulcer of the stomach and intestine    Social History   Socioeconomic History  . Marital status: Married    Spouse name: Not on file  . Number of children: Not on file  . Years of education: Not on file  . Highest education level: Not on file  Occupational History  . Not on file  Social Needs  . Financial resource strain: Not on file  . Food insecurity    Worry: Not on file    Inability: Not on file  . Transportation needs    Medical: Not on file    Non-medical: Not on file  Tobacco Use  . Smoking status: Current Every Day Smoker    Packs/day: 1.00    Years: 30.00    Pack years: 30.00  . Smokeless tobacco: Never Used  Substance and Sexual Activity  . Alcohol use: No  . Drug use: Yes    Types: Marijuana  . Sexual activity: Not on file  Lifestyle  . Physical activity    Days per week: Not on file    Minutes per session: Not on file  . Stress: Not on file  Relationships  . Social Herbalist on phone: Not on file    Gets together: Not on file    Attends religious service: Not on file    Active member of club or organization: Not on file    Attends meetings of clubs or organizations: Not on file    Relationship status: Not on file  Other Topics Concern  . Not on file  Social History Narrative  . Not on file   Family History  Problem Relation Age of Onset  . Healthy Mother    Scheduled Meds: . Chlorhexidine Gluconate Cloth  6 each Topical Daily  . dexamethasone (DECADRON) injection  8 mg Intravenous Q24H  . enoxaparin (LOVENOX) injection  100 mg Subcutaneous Q24H  . feeding supplement  1 Container Oral BID BM  . feeding supplement (PRO-STAT SUGAR FREE 64)  30 mL Oral BID  . influenza vac split quadrivalent PF  0.5 mL Intramuscular Tomorrow-1000  . mouth rinse  15 mL Mouth Rinse BID  . mirtazapine  15 mg Oral QHS  . multivitamin with minerals  1 tablet Oral Daily  . polyethylene glycol  17 g Oral Daily  .  senna-docusate  1 tablet Oral BID  . sodium chloride flush  3 mL Intravenous Q12H   Continuous Infusions: . sodium chloride 100 mL/hr at 05/16/19 1132   PRN Meds:.acetaminophen **OR** acetaminophen, bisacodyl, HYDROmorphone (DILAUDID) injection, LORazepam, metoprolol tartrate, morphine injection, ondansetron **OR** ondansetron (ZOFRAN) IV Medications Prior to Admission:  Prior to Admission medications  Medication Sig Start Date End Date Taking? Authorizing Provider  morphine (MS CONTIN) 30 MG 12 hr tablet Take 1 tablet (30 mg total) by mouth every 12 (twelve) hours. 05/03/19  Yes Curt Bears, MD  morphine (MSIR) 15 MG tablet Take 1 tablet (15 mg total) by mouth every 6 (six) hours as needed for severe pain. 05/03/19  Yes Curt Bears, MD  fentaNYL (DURAGESIC) 25 MCG/HR Place 1 patch onto the skin every 3 (three) days. Rotate sites every application Patient not taking: Reported on 05/03/2019 05/02/19   Lauraine Rinne, NP  DULoxetine (CYMBALTA) 30 MG capsule Take 1 capsule (30 mg total) by mouth daily. 12/05/17 03/22/19  [name], MD   Allergies  Allergen Reactions  . Fentanyl Nausea And Vomiting    Pt stopped med on his own -  Not feeling self  . Hydrocodone Other (See Comments)    Stays awake, cant sleep, decreased appetite  . Oxycodone     Nausea, "not myself"   Review of Systems  Respiratory: Positive for chest tightness and shortness of breath.   Cardiovascular: Positive for chest pain (At site of cancer near the clavicular border).   Physical Exam General: Appears sleepy but arouses easily, in moderate respiratory distress, on nonrebreather  HEENT: No bruits, no goiter, no JVD Heart: Tachy. No murmur appreciated. Lungs: Decreased air movement, shallow breaths Abdomen: Soft, nontender, nondistended, positive bowel sounds.  Ext: No significant edema Skin: Warm and dry, cancer-related changes noted near right clavicle and neck  Vital Signs: BP 102/65   Pulse (!)  124   Temp 98.6 F (37 C) (Oral)   Resp (!) 27   Ht 6' 2"  (1.88 m)   Wt 66.2 kg   SpO2 94%   BMI 18.75 kg/m  Pain Scale: 0-10   Pain Score: 7    SpO2: SpO2: 94 % O2 Device:SpO2: 94 % O2 Flow Rate: .O2 Flow Rate (L/min): 15 L/min  IO: Intake/output summary:   Intake/Output Summary (Last 24 hours) at 05/16/2019 1612 Last data filed at 05/16/2019 1132 Gross per 24 hour  Intake 2598.45 ml  Output 1000 ml  Net 1598.45 ml    LBM: Last BM Date: 05/14/19 Baseline Weight: Weight: 73.5 kg Most recent weight: Weight: 66.2 kg     Palliative Assessment/Data:     Time: 1660-6004, 1000-1040 Time Total: 75 Greater than 50%  of this time was spent counseling and coordinating care related to the above assessment and plan.  Signed by: Micheline Rough, MD   Please contact Palliative Medicine Team phone at [phone] for questions and concerns.  For individual provider: See Shea Evans

## 2019-05-16 NOTE — Progress Notes (Signed)
Charge nurse was informed by primary RN that rapid response was called. Pt overnight c/o increased SOB and not being able to breathe. Was originally placed on 5L [place] with no relief. Rapid response was called due to the increased SOB. Pt placed on NRB and saturations 90-94%. Pt continues to have uncontrolled pain. Palliative consult was entered and goals of care discussion was started with wife. Pt placed on DNR/DNI. Will continue to watch pt over the next 24 hours before deciding on comfort care. Pt transferred to stepdown unit for closer monitoring. Wife was informed about transfer. Report called to RN and pt was transferred to the unit. Pt currently stable upon transfer.

## 2019-05-16 NOTE — Progress Notes (Signed)
Initial Nutrition Assessment  RD working remotely.   DOCUMENTATION CODES:   (unable to assess for malnutrition at this time.)  INTERVENTION:  - will order Boost Breeze TID, each supplement provides 250 kcal and 9 grams of protein. - will order 30 mL Prostat BID, each supplement provides 100 kcal and 15 grams of protein. - will order daily multivitamin with minerals. - diet advancement as medically feasible.    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 He presented to the ED with right-sided chest pain ongoing x4 days. Patient reports\ed pain has been progressively worsening and that it was uncontrolled with his home pain medications. He also reported difficulty breathing and occasional dry cough. PTA he had been referred to radiation oncology but was unable to attend appointments due to severe pain and fatigue. He has been most bedbound and unable to ambulate due to pain. He reported decreased appetite and not eating or drinking anything well. CT chest showed pulmonary embolism and patient was admitted for further work-up.  Diet changed from Regular to CLD today at 0815. Per flow sheet, patient consumed 15% of breakfast and 25% of lunch yesterday. He was transferred from [address] to S[facility] ~45 minutes ago following a call to Rapid Response. Patient on non-rebreather. Notes indicate patient continues to have uncontrolled pain. Patient is DNR/DNI and RN note from 1100 indicates that plan is to monitor over the next 24 hours and then possibly transition to comfort care.   RD did not attempt to contact  patient at this time given the above listed information. Patient has not been seen by a [place] RD at any time in the past.   Labs reviewed; Na: 146 mmol/l, K: 5.9 mmol/, BUN: 29 mg/dl. Medications reviewed; 1 packet miralax/day, 1 tablet senokot BID.  IVF; NS @ 100 ml/hr.     NUTRITION - FOCUSED PHYSICAL EXAM:  unable to complete at this time.   Diet Order:   Diet Order            Diet clear liquid Room service appropriate? Yes; Fluid consistency: Thin  Diet effective now              EDUCATION NEEDS:   No education needs have been identified at this time  Skin:  Skin Assessment: Skin Integrity Issues: Skin Integrity Issues:: Stage I Stage I: stage 1 L hip  Last BM:  10/9  Height:   Ht Readings from Last 1 Encounters:  05/16/19 6\' 2"  (1.88 m)    Weight:   Wt Readings from Last 1 Encounters:  05/16/19 66.2 kg    Ideal Body Weight:  86.4 kg  BMI:  Body mass index is 18.75 kg/m.  Estimated Nutritional Needs:   Kcal:  4403-4742 kcal (35-40 kcal/kg)  Protein:  130-140 grams  Fluid:  >/= 2.3 L/day      Jarome Matin, MS, RD, LDN, [facility] Inpatient Clinical Dietitian Pager # [phone] After hours/weekend pager # [phone]

## 2019-05-16 NOTE — Progress Notes (Signed)
Pt has refused to turn off left side all night.

## 2019-05-16 NOTE — Progress Notes (Signed)
[facility]                                                                              Patient Demographics  Justin Randall, is a 48 y.o. male, DOB - November 01, 1970, HQP:[id number]  Admit date - 05/31/2019   Admitting Physician Lavina Hamman, MD  Outpatient Primary MD for the patient is [name], MD  Outpatient specialists:   LOS - 0  days   Medical records reviewed and are as summarized below:    No chief complaint on file.      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 Julien Nordmann, presented to ED with right-sided chest pain ongoing for last 4 days.  Patient reports pain has been progressively getting worse and uncontrolled with his home pain medications.  He also reported difficulty breathing.  No fevers or chills.  Occasional dry coughing.  Patient has metastasis to the right shoulder, he had been referred to radiation oncology however had not been able to attend appointments due to severe pain and fatigue.  Patient has been unable to ambulate secondary to severe pain and has been mostly bedbound.  Patient reports decreased appetite, not eating or drinking anything well.   CT angiogram chest showed pulmonary embolism, patient was admitted for further work-up.   Assessment & Plan    Principal Problem: Acute respiratory failure with hypoxia likely secondary to pulmonary emboli ([place]), stage IV lung CA, metastatic -Overnight patient noted to have worsening dyspnea, tachypneic and tachycardiac.  Intractable chest pain. -This morning called for rapid response, (please see my note from earlier) -Placed on IV Dilaudid for pain control, patient reports not able to swallow oral medications well.  Also placed on IV Ativan for anxiety. -Patient keeps taking Ventimask off, may need to place on NRB versus BiPAP -Goals of care addressed with the patient and his wife, DNR/DNI, if no significant  improvement in next 24-48hrs, may need to pursue comfort care  -We will transfer to stepdown unit for closer monitoring for respiratory status -Highly appreciate Dr. Domingo Cocking for palliative medicine recommendations, symptom management and assisting me with the patient.  Active Problems: Acute PE: Left lower lobe, left upper lobe, mild overall clot burden, no right heart strain -Continue therapeutic Lovenox, indefinitely in the setting of malignancy  Lactic acidosis -Likely due to dehydration, poor oral intake.  No illness or sepsis identified -Received IV fluid bolus, continue IV fluid hydration -SLP evaluation once more hemodynamically stable  Metastatic stage IV adenocarcinoma right lung, mets to right shoulder, intractable pain -Pain has been excruciating, uncontrolled with his home pain regiment also patient reports difficulty swallowing pills -For now changed to IV Dilaudid, added IV steroids, IV Ativan as needed for anxiety -SLP evaluation once more hemodynamically stable -Patient has been referred to radiation oncology for XRT of the shoulder and left supraclavicular mass but unable to make it to appointments due to severe pain  Pressure injury Left anterior hip stage I -Continue wound care per nursing   Severe protein calorie malnutrition -In the context of acute illness, chronic malignancy,  metastatic, lung CA -BMI 18.1  Code Status: DNR status per patient and his wife DVT Prophylaxis:  Lovenox therapeutic Family Communication: Discussed all imaging results, lab results, explained to the patient and patient's wife on the phone   Disposition Plan: Transfer to stepdown unit for closer monitoring, secondary to acute respiratory distress with hypoxia.  High risk of deterioration.  Remains inpatient  Critical care Time Spent in minutes 45 minutes, including chart review, patient examination, decision making and consultation, discussing with patient and wife.  Procedures:    None  Consultants:   Palliative medicine  Antimicrobials:   Anti-infectives (From admission, onward)   None          Medications  Scheduled Meds:  dexamethasone (DECADRON) injection  8 mg Intravenous Q24H   enoxaparin (LOVENOX) injection  100 mg Subcutaneous Q24H   influenza vac split quadrivalent PF  0.5 mL Intramuscular Tomorrow-1000   mirtazapine  15 mg Oral QHS   polyethylene glycol  17 g Oral Daily   senna-docusate  1 tablet Oral BID   sodium chloride flush  3 mL Intravenous Q12H   Continuous Infusions:  sodium chloride 100 mL/hr at 05/16/19 1007   PRN Meds:.acetaminophen **OR** acetaminophen, bisacodyl, HYDROmorphone (DILAUDID) injection, LORazepam, morphine injection, ondansetron **OR** ondansetron (ZOFRAN) IV      Subjective:   Justin Randall was seen and examined today.  Difficulty breathing or talking, was placed on Ventimask, patient keeps taking it off.  Complaining of intractable right-sided chest and shoulder pain 10/10.  Denies abdominal pain, N/V/D/C.   Objective:   Vitals:   05/16/19 0835 05/16/19 0840 05/16/19 0850 05/16/19 0900  BP: 105/78 (!) 78/64 100/81 104/75  Pulse: (!) 129 (!) 128 (!) 125 (!) 125  Resp: 15 14 16 16   Temp:      TempSrc:      SpO2: 93% 92% 95% 94%  Weight:      Height:        Intake/Output Summary (Last 24 hours) at 05/16/2019 1033 Last data filed at 05/16/2019 1009 Gross per 24 hour  Intake 2544.07 ml  Output 1250 ml  Net 1294.07 ml     Wt Readings from Last 3 Encounters:  05/17/2019 64 kg  04/27/19 74.6 kg  04/26/19 79.4 kg     Exam  General: Alert and awake, acute respiratory distress, on Ventimask  Eyes:   HEENT:  Atraumatic, normocephalic  Cardiovascular: S1 S2 auscultated, no murmurs, RRR  Respiratory: Decreased breath sounds throughout  Gastrointestinal: Soft, nontender, nondistended, + bowel sounds  Ext: no pedal edema bilaterally  Neuro: Moving all 4  extremities  Musculoskeletal: No digital cyanosis, clubbing  Skin: No rashes  Psych: Alert and awake, oriented, respiratory distress, anxious   Data Reviewed:  I have personally reviewed following labs and imaging studies  Micro Results Recent Results (from the past 240 hour(s))  Urine culture     Status: None   Collection Time: 05/09/2019  1:41 AM   Specimen: In/Out Cath Urine  Result Value Ref Range Status   Specimen Description   Final    IN/OUT CATH URINE Performed at [facility], [place] [address], [place], [place] [address]    Special Requests   Final    NONE Performed at [facility], [place] [address], [place], [place] [address]    Culture   Final    NO GROWTH Performed at [facility], [place] [address] [place], [place] [address]    Report Status 05/16/2019 FINAL  Final  SARS Coronavirus 2 by RT PCR (hospital order, performed in [facility] hospital lab) Nasopharyngeal Nasopharyngeal Swab     Status: None   Collection Time: 06/03/2019  1:42 AM   Specimen: Nasopharyngeal Swab  Result Value Ref Range Status   SARS Coronavirus 2 NEGATIVE NEGATIVE Final    Comment: (NOTE) If result is NEGATIVE SARS-CoV-2 target nucleic acids are NOT DETECTED. The SARS-CoV-2 RNA is generally detectable in upper and lower  respiratory specimens during the acute phase of infection. The lowest  concentration of SARS-CoV-2 viral copies this assay can detect is 250  copies / mL. A negative result does not preclude SARS-CoV-2 infection  and should not be used as the sole basis for treatment or other  patient management decisions.  A negative result may occur with  improper specimen collection / handling, submission of specimen other  than nasopharyngeal swab, presence of viral mutation(s) within the  areas targeted by this assay, and inadequate number of viral copies  (<250 copies / mL). A negative result must be combined with clinical  observations,  patient history, and epidemiological information. If result is POSITIVE SARS-CoV-2 target nucleic acids are DETECTED. The SARS-CoV-2 RNA is generally detectable in upper and lower  respiratory specimens dur ing the acute phase of infection.  Positive  results are indicative of active infection with SARS-CoV-2.  Clinical  correlation with patient history and other diagnostic information is  necessary to determine patient infection status.  Positive results do  not rule out bacterial infection or co-infection with other viruses. If result is PRESUMPTIVE POSTIVE SARS-CoV-2 nucleic acids MAY BE PRESENT.   A presumptive positive result was obtained on the submitted specimen  and confirmed on repeat testing.  While 2019 novel coronavirus  (SARS-CoV-2) nucleic acids may be present in the submitted sample  additional confirmatory testing may be necessary for epidemiological  and / or clinical management purposes  to differentiate between  SARS-CoV-2 and other Sarbecovirus currently known to infect humans.  If clinically indicated additional testing with an alternate test  methodology [phone]) is advised. The SARS-CoV-2 RNA is generally  detectable in upper and lower respiratory sp ecimens during the acute  phase of infection. The expected result is Negative. Fact Sheet for Patients:  StrictlyIdeas.no Fact Sheet for Healthcare Providers: BankingDealers.co.za This test is not yet approved or cleared by the [place] FDA and has been authorized for detection and/or diagnosis of SARS-CoV-2 by FDA under an Emergency Use Authorization (EUA).  This EUA will remain in effect (meaning this test can be used) for the duration of the COVID-19 declaration under Section 564(b)(1) of the Act, 21 U.S.C. section 360bbb-3(b)(1), unless the authorization is terminated or revoked sooner. Performed at [facility], [place] [address], [place], [place] [address]   Blood Culture (routine x 2)     Status: None (Preliminary result)   Collection Time: 05/26/2019  1:59 AM   Specimen: BLOOD RIGHT WRIST  Result Value Ref Range Status   Specimen Description   Final    BLOOD RIGHT WRIST Performed at [facility], [address] [place], [place] [address]    Special Requests   Final    BOTTLES DRAWN AEROBIC AND ANAEROBIC Blood Culture adequate volume Performed at [place] [address] [place], [place] [address]    Culture PENDING  Incomplete   Report Status PENDING  Incomplete  Blood Culture (routine x 2)     Status: None (Preliminary result)  Collection Time: 05/18/2019  2:00 AM   Specimen: BLOOD LEFT FOREARM  Result Value Ref Range Status   Specimen Description   Final    BLOOD LEFT FOREARM Performed at [facility], [address] [place], [place] [address]    Special Requests   Final    BOTTLES DRAWN AEROBIC AND ANAEROBIC Blood Culture adequate volume Performed at [place] [address] [place], [place] [address]    Culture PENDING  Incomplete   Report Status PENDING  Incomplete    Radiology Reports Ct Angio Chest Pe W And/or Wo Contrast  Result Date: 06/03/2019 CLINICAL DATA:  Per EMS, Pt called EMS for right 9/10 flank pain. Steadily increasing pain for the last 4 days. Pt has stage 4 lung cancer. Last radiation treatment was 05/11/2019. Pt has prescription for morphine, last took yesterday. Omni 350/.*comment was truncated*^12mL OMNIPAQUE IOHEXOL 350 MG/ML SOLNPE suspected, high pretest prob EXAM: CT ANGIOGRAPHY CHEST WITH CONTRAST TECHNIQUE: Multidetector CT imaging of the chest was performed using the standard protocol during bolus administration of intravenous contrast. Multiplanar CT image reconstructions and MIPs were obtained to evaluate the vascular anatomy. CONTRAST:  146mL OMNIPAQUE IOHEXOL 350 MG/ML SOLN COMPARISON:  04/20/2019, PET-CT  FINDINGS: Cardiovascular: A tubular filling defect in the LEFT lower lobe pulmonary artery (image 57/4 consistent with thromboembolus. This is partially occlusive. Small embolus in the LEFT upper lobe pulmonary artery is partially wall adherent (image 37/4). No RIGHT ventricular strain evident. Mediastinum/Nodes: Bulky RIGHT axillary and RIGHT septic clavicular adenopathy again demonstrated. There is a mass of nodal tissue in the RIGHT supraclavicular region measuring 6.5 by 7.3 cm. This intense hypermetabolic on comparison PET scan. The nodal mass appears increased from roughly 5.4 x 5.4 cm. In the anterior mediastinum large prevascular lymph node measuring 3 cm similar. Small pericardial effusions increased. Lungs/Pleura: RIGHT upper lobe irregular mass measuring 3.6 cm not changed comparison exam. No pneumothorax. Pneumonia. Small focus of ground-glass density in the lateral aspect of the RIGHT middle lobe. Paraseptal emphysema the upper lobes. Upper Abdomen: Limited view of the liver, kidneys, pancreas are unremarkable. Normal adrenal glands. Musculoskeletal: No clear aggressive osseous lesion. Review of the MIP images confirms the above findings. IMPRESSION: 1. Small pulmonary emboli in the LEFT lower lobe and LEFT upper lobe. Overall clot burden mild. 2. Stable RIGHT upper lobe mass. 3. Bulky RIGHT axillary and supraclavicular adenopathy. 4. Interval increase in size of RIGHT supraclavicular nodal mass. 5. Small pericardial effusion increased. Aortic Atherosclerosis (ICD10-I70.0) and Emphysema (ICD10-J43.9). Findings conveyed [facility] on 05/20/2019  at04:51. Electronically Signed   By: Suzy Bouchard M.D.   On: 06/02/2019 04:51   Nm Pet Image Initial (pi) Skull Base To Thigh  Result Date: 04/20/2019 CLINICAL DATA:  Initial treatment strategy for metastatic lung cancer. EXAM: NUCLEAR MEDICINE PET SKULL BASE TO THIGH TECHNIQUE: 8.74 mCi F-18 FDG was injected intravenously. Full-ring PET  imaging was performed from the skull base to thigh after the radiotracer. CT data was obtained and used for attenuation correction and anatomic localization. Fasting blood glucose: 99 mg/dl COMPARISON:  CT chest, abdomen and pelvis 03/30/2019 FINDINGS: Mediastinal blood pool activity: SUV max 2.95 Liver activity: SUV max NA NECK: Extensive FDG avid adenopathy is identified within right cervical lymph node chain. Hypermetabolic lymph nodes are identified within the right level 2, 3, 4 and 5 stations. Index right level 2 node measures 1.1 cm within SUV max of 18.47. Index right level 3/4 lymph node measures 2.2 cm short axis  and has an SUV max of 20.12. Incidental CT findings: none CHEST: Extensive right supraclavicular hypermetabolic adenopathy identified. Index right supraclavicular lymph node measures 2.9 cm with SUV max of 18.58. Hypermetabolic right retropectoral and right axillary lymph nodes identified. Right axillary node measures 1.9 cm short axis and has an SUV max of 26.4. Multiple left supraclavicular hypermetabolic lymph nodes are identified. Index lymph node measures 1.3 cm with SUV max of 12.59. Hypermetabolic bilateral mediastinal, subcarinal, and posterior mediastinal adenopathy identified: Right pre-vascular node measures 2.8 cm and has an SUV max of 14.1. Index left pre-vascular lymph node measures 1.5 cm with SUV max of 17.02. Subcarinal lymph node measures 1.2 cm within SUV max of 12.14. Posterior mediastinal lymph node between the aorta and esophagus adjacent to the left mainstem bronchus measures 2.3 cm and has an SUV max of 15.35. Right apical lung mass with central cavitation measures 4 cm and has an SUV max of 16.38. Incidental CT findings: Advanced bullous emphysema. Small pericardial effusion. Mild aortic atherosclerosis. ABDOMEN/PELVIS: No abnormal hypermetabolic activity within the liver, pancreas, adrenal glands, or spleen. No hypermetabolic lymph nodes in the abdomen or pelvis.  Incidental CT findings: Aortic atherosclerosis. SKELETON: 2 foci of chest wall involvement are identified involving the upper medial aspect of the right pectoralis musculature with SUV max of 11.7. This appears to be a direct extension from right supraclavicular tumor. On the left, there is asymmetric increased uptake along the left internal mammary lymph node chain with extension into the second left costosternal junction. No findings to suggest distant osseous metastatic disease within the axial or proximal appendicular skeleton. Incidental CT findings: none IMPRESSION: 1. Hypermetabolic right upper lobe lung mass is again noted compatible with primary bronchogenic carcinoma. 2. Bilateral mediastinal, bilateral supraclavicular, right subpectoral and right axillary, and right level 2, 3, 4, and 5 cervical hypermetabolic nodal metastasis. 3. Evidence of right upper chest wall involvement by tumor which is felt to be a direct extension from bulky right supraclavicular adenopathy. 4. Left chest wall involvement is also suspected at the left second costosternal junction. This may be the result of direct extension from left internal mammary nodal metastasis. Electronically Signed   By: Kerby Moors M.D.   On: 04/20/2019 14:11   Dg Chest Port 1 View  Result Date: 05/17/2019 CLINICAL DATA:  Flank pain. History of lung cancer. EXAM: PORTABLE CHEST 1 VIEW COMPARISON:  03/22/2019 chest radiograph and 03/30/2019 chest CT FINDINGS: Right apical opacities are consistent with previously demonstrated cavitary mass. The lungs are otherwise clear. There is bullous emphysema apices. IMPRESSION: 1. No acute cardiopulmonary disease. 2. Unchanged appearance of the right apical cavitary mass. 3. Emphysema. Electronically Signed   By: Ulyses Jarred M.D.   On: 05/30/2019 02:34    Lab Data:  CBC: Recent Labs  Lab 05/28/2019 0159  WBC 23.9*  NEUTROABS 19.1*  HGB 15.8  HCT 48.9  MCV 92.8  PLT 98*   Basic Metabolic  Panel: Recent Labs  Lab 05/07/2019 0159  NA 142  K 4.6  CL 102  CO2 24  GLUCOSE 103*  BUN 40*  CREATININE 1.00  CALCIUM 10.3   GFR: Estimated Creatinine Clearance: 81.8 mL/min (by C-G formula based on SCr of 1 mg/dL). Liver Function Tests: Recent Labs  Lab 05/09/2019 0159  AST 19  ALT 22  ALKPHOS 110  BILITOT 1.7*  PROT 7.6  ALBUMIN 3.3*   No results for input(s): LIPASE, AMYLASE in the last 168 hours. No results for input(s): AMMONIA in the  last 168 hours. Coagulation Profile: Recent Labs  Lab 05/25/2019 0159  INR 1.3*   Cardiac Enzymes: No results for input(s): CKTOTAL, CKMB, CKMBINDEX, TROPONINI in the last 168 hours. BNP (last 3 results) No results for input(s): PROBNP in the last 8760 hours. HbA1C: No results for input(s): HGBA1C in the last 72 hours. CBG: No results for input(s): GLUCAP in the last 168 hours. Lipid Profile: No results for input(s): CHOL, HDL, LDLCALC, TRIG, CHOLHDL, LDLDIRECT in the last 72 hours. Thyroid Function Tests: No results for input(s): TSH, T4TOTAL, FREET4, T3FREE, THYROIDAB in the last 72 hours. Anemia Panel: No results for input(s): VITAMINB12, FOLATE, FERRITIN, TIBC, IRON, RETICCTPCT in the last 72 hours. Urine analysis:    Component Value Date/Time   COLORURINE YELLOW 05/30/2019 0141   APPEARANCEUR CLEAR 05/08/2019 0141   LABSPEC 1.044 (H) 05/08/2019 0141   PHURINE 5.0 06/01/2019 0141   GLUCOSEU NEGATIVE 05/20/2019 0141   HGBUR MODERATE (A) 06/01/2019 0141   BILIRUBINUR NEGATIVE 05/26/2019 0141   KETONESUR NEGATIVE 05/11/2019 0141   PROTEINUR NEGATIVE 05/11/2019 0141   NITRITE NEGATIVE 06/04/2019 0141   LEUKOCYTESUR NEGATIVE 05/14/2019 0141     Aedyn Mckeon M.D. [facility] 05/16/2019, 10:33 AM  Pager: [phone] Between 7am to 7pm - call Pager - [phone]  After 7pm go to [email or web address] - password [email or web address]  Call night coverage person covering after 7pm

## 2019-05-16 NOTE — Progress Notes (Signed)
OT Cancellation Note  Patient Details Name: Justin Randall MRN: [id number] DOB: Feb 15, 1971   Cancelled Treatment:     pnt having a rapid response and is dnr per nursing and was not able to be seen.   Quorra Rosene 05/16/2019, 9:48 AM

## 2019-05-16 NOTE — Progress Notes (Signed)
Pt c/o SOB, flank pain, HR 130's, RR 32. Lorra Hals notified.

## 2019-05-16 NOTE — Progress Notes (Signed)
PT Cancellation Note  Patient Details Name: Justin Randall MRN: [id number] DOB: 1971/05/10   Cancelled Treatment:    Reason Eval/Treat Not Completed: Patient not medically ready;Medical issues which prohibited therapy;  Rapid Response called this am,  O2 sats in 80s, patient was placed on Ventimask.  Right-sided chest pain--+PE;  It is noted that Palliative Medicine has been consulted for goals of care. Will continue to efforts and proceed accordingly  [facility] 05/16/2019, 10:02 AM

## 2019-05-16 NOTE — Progress Notes (Signed)
Called d/t pt c/o SOB, arrived to find pt resting comfortably on left side.  Pt RN had made [facility] on call aware pt has been in pain (not new).  Pt had received 2 mg IV morphine and was resting.  Pt able to f/c. Lungs decreased, 4 L [place].  Pt wanted to rest from pain, pt RN feels he looks better as well.  Informed to call if pt has anymore issues.

## 2019-05-16 NOTE — Progress Notes (Signed)
Pt HR sustaining in 120-130's despite pain management and O2 sat at 94% on NRBM. Pt denies anxiety at this time. Dr Tana Coast paged; awaiting orders.

## 2019-05-16 NOTE — Plan of Care (Signed)
Patient seen and examined called by RN, rapid response for shortness of breath.  Chart reviewed, patient also complained of difficulty swallowing pills.  O2 sats in 80s, patient was placed on Ventimask.  Right-sided chest pain 10/10, states difficulty talking, short of breath.   -BP soft, will give fluid bolus 1 L, - DC all p.o. morphine, placed on IV 2 to 4 mg every 2 hours as needed, IV Ativan 0.5 mg every 4 hours as needed for anxiety -Continue Ventimask to keep O2 sats up > 90%, heparin drip for new PE -Change to clear liquid diet, swallowing evaluation when stable -Palliative medicine consulted, discussed with Dr. Domingo Cocking for goals of care and symptom management  -Discussed in detail with patient's wife, Mrs. Carolan Shiver, discussed all the above and current hemodynamic status, overall poor prognosis with stage IV adeno CA lung, has not been able to tolerate XRT, intractable pain, bedbound.  We discussed code status, per her request changed to DNR/DNI.  We discussed about hospice and comfort care if no significant improvement.  Will follow palliative medicine recommendations.  Full note to follow  Ripudeep Rai M.D. [facility] 05/16/2019, 8:32 AM  Pager: [phone]

## 2019-05-17 ENCOUNTER — Ambulatory Visit: Payer: Medicare Other

## 2019-05-17 ENCOUNTER — Encounter (HOSPITAL_COMMUNITY): Payer: Medicare Other

## 2019-05-17 ENCOUNTER — Inpatient Hospital Stay: Payer: Medicare Other | Attending: Physician Assistant | Admitting: Physician Assistant

## 2019-05-17 ENCOUNTER — Inpatient Hospital Stay: Payer: Medicare Other

## 2019-05-17 DIAGNOSIS — G893 Neoplasm related pain (acute) (chronic): Secondary | ICD-10-CM

## 2019-05-17 DIAGNOSIS — C3491 Malignant neoplasm of unspecified part of right bronchus or lung: Secondary | ICD-10-CM

## 2019-05-17 LAB — BASIC METABOLIC PANEL
Anion gap: 11 (ref 5–15)
BUN: 25 mg/dL — ABNORMAL HIGH (ref 6–20)
CO2: 21 mmol/L — ABNORMAL LOW (ref 22–32)
Calcium: 8.8 mg/dL — ABNORMAL LOW (ref 8.9–10.3)
Chloride: 114 mmol/L — ABNORMAL HIGH (ref 98–111)
Creatinine, Ser: 0.8 mg/dL (ref 0.61–1.24)
GFR calc Af Amer: >60 mL/min (ref >60)
GFR calc non Af Amer: >60 mL/min (ref >60)
Glucose, Bld: 109 mg/dL — ABNORMAL HIGH (ref 70–99)
Potassium: 3.8 mmol/L (ref 3.5–5.1)
Sodium: 146 mmol/L — ABNORMAL HIGH (ref 135–145)

## 2019-05-17 LAB — CBC
HCT: 40.7 % (ref 39.0–52.0)
Hemoglobin: 12.9 g/dL — ABNORMAL LOW (ref 13.0–17.0)
MCH: 30.5 pg (ref 26.0–34.0)
MCHC: 31.7 g/dL (ref 30.0–36.0)
MCV: 96.2 fL (ref 80.0–100.0)
Platelets: 121 10*3/uL — ABNORMAL LOW (ref 150–400)
RBC: 4.23 MIL/uL (ref 4.22–5.81)
RDW: 15.9 % — ABNORMAL HIGH (ref 11.5–15.5)
WBC: 15.4 10*3/uL — ABNORMAL HIGH (ref 4.0–10.5)
nRBC: 0 % (ref 0.0–0.2)

## 2019-05-17 MED ORDER — PHENOL 1.4 % MT LIQD
1.0000 | OROMUCOSAL | Status: DC | PRN
  Filled 2019-05-17: qty 177

## 2019-05-17 NOTE — Evaluation (Signed)
Physical Therapy Evaluation Patient Details Name: Justin Randall MRN: [id number] DOB: Jan 11, 1971 Today's Date: 05/17/2019   History of Present Illness  Justin Randall is a 48 y.o. male with Past medical history of stage IV non-small cell lung cancer.Patient presents with complaints of right-sided chest pain ongoing for last 4 days, shortness of breath. 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 Pt to benefit from acute PT to address deficits. Pt tolerated sitting EOB for ~5 minutes, pt reliant on tripod position in sitting for improved accessory muscle breathing. PT recommending home with HHPT, vs home with hospice pending palliative plan. Pt may benefit from hospital bed for home, as pt has been essentially bed-level for a few weeks and is dependent for ADLs in bed. PT to progress mobility as tolerated, and will continue to follow acutely.      Follow Up Recommendations Home health PT;Supervision/Assistance - 24 hour(if pt d/c home)    Equipment Recommendations  Other (comment);Hospital bed    Recommendations for Other Services       Precautions / Restrictions Precautions Precautions: Fall Precaution Comments: watch vitals (especially O2 on NRB 15 liters) Restrictions Weight Bearing Restrictions: No      Mobility  Bed Mobility Overal bed mobility: Needs Assistance Bed Mobility: Sit to Supine;Supine to Sit     Supine to sit: Min assist;HOB elevated Sit to supine: +2 for safety/equipment;Min assist   General bed mobility comments: Min assist for trunk elevation from bed, sat  up on L side to avoid stressing pt's R shoulder.  Transfers Overall transfer level: (NT - pt defers)                  Ambulation/Gait                Stairs            Wheelchair Mobility    Modified Rankin (Stroke Patients Only)       Balance Overall balance assessment: Needs assistance Sitting-balance support: Feet supported Sitting balance-Leahy Scale: Fair Sitting balance - Comments: sat in tripod position at EOB for ease of breathing for ~5 minutes       Standing balance comment: NT - politely declined stand attempt due to fatigue                             Pertinent Vitals/Pain Pain Assessment: 0-10 Pain Score: 7  Pain Location: right shoulder Pain Descriptors / Indicators: Aching;Sore Pain Intervention(s): Limited activity within patient's tolerance;Monitored during session;Premedicated before session;Repositioned    Home Living Family/patient expects to be discharged to:: Private residence Living Arrangements: Spouse/significant other Available Help at Discharge: Family;Available 24 hours/day Type of Home: House Home Access: Level entry     Home Layout: One level Home Equipment: Cane - single point      Prior Function Level of Independence: Needs assistance   Gait / Transfers Assistance Needed: pt mostly bed-level for the past 2 weeks or so, typically ambulates with cane  ADL's / Homemaking Assistance Needed: Wife did sponge bath in bed and dressed him in bed  Hand Dominance   Dominant Hand: Right    Extremity/Trunk Assessment   Upper Extremity Assessment Upper Extremity Assessment: RUE deficits/detail RUE Deficits / Details: Per OT note: painful at shoulder    Lower Extremity Assessment Lower Extremity Assessment: Generalized weakness(able to perform hip and knee flexion/extension partial AROM, observed during bed mobility)    Cervical / Trunk Assessment Cervical / Trunk Assessment: Kyphotic(tripod  positioning to assist with breathing)  Communication   Communication: Other (comment)(a little difficult to understand due to SOB and NRB)  Cognition Arousal/Alertness: Awake/alert Behavior During Therapy: WFL for tasks assessed/performed Overall Cognitive Status: Within Functional Limits for tasks assessed                                 General Comments: pt somewhat drowsy, pt reporting fatigue      General Comments      Exercises Other Exercises Other Exercises: pt encouraged to roll in bed ~ every 2 hours and sustain sidelying-type position for a couple of minutes for pressure relief/skin integrity   Assessment/Plan    PT Assessment Patient needs continued PT services  PT Problem List Decreased strength;Decreased mobility;Decreased range of motion;Decreased activity tolerance;Decreased balance;Decreased knowledge of use of DME;Pain;Cardiopulmonary status limiting activity       PT Treatment Interventions DME instruction;Therapeutic activities;Gait training;Therapeutic exercise;Patient/family education;Balance training;Functional mobility training    PT Goals (Current goals can be found in the Care Plan section)  Acute Rehab PT Goals Patient Stated Goal: progress mobility PT Goal Formulation: With patient Time For Goal Achievement: 05/31/19 Potential to Achieve Goals: Fair    Frequency Min 3X/week   Barriers to discharge        Co-evaluation PT/OT/SLP Co-Evaluation/Treatment: Yes Reason for Co-Treatment: Complexity of the patient's impairments (multi-system involvement) PT goals addressed during session: Mobility/safety with mobility OT goals addressed during session: ADL's and self-care       AM-PAC PT "6 Clicks" Mobility  Outcome Measure Help needed turning from your back to your side while in a flat bed without using bedrails?: A Little Help needed moving from lying on your back to sitting on the side of a flat bed without using bedrails?: A  Little Help needed moving to and from a bed to a chair (including a wheelchair)?: A Lot Help needed standing up from a chair using your arms (e.g., wheelchair or bedside chair)?: A Lot Help needed to walk in hospital room?: Total Help needed climbing 3-5 steps with a railing? : Total 6 Click Score: 12    End of Session Equipment Utilized During Treatment: Oxygen(15LO2, nonrebreather) Activity Tolerance: Patient limited by fatigue Patient left: in bed;with call bell/phone within reach;with family/visitor present Nurse Communication: Mobility status PT Visit Diagnosis: Other abnormalities of gait and mobility (R26.89);Muscle weakness (generalized) (M62.81)    Time: 6712-4580 PT Time Calculation (min) (ACUTE ONLY): 12 min   Charges:   PT Evaluation $PT Eval Low Complexity: 1 Low          Enjoli Tidd Conception Chancy, PT Acute Rehabilitation Services Pager [phone]  Office [phone]   Jahaira Earnhart D Elonda Husky 05/17/2019, 6:06 PM

## 2019-05-17 NOTE — Progress Notes (Signed)
[facility]                                                                              Patient Demographics  Justin Randall, is a 48 y.o. male, DOB - [birth year], FVC:[id number]  Admit date - 06/05/2019   Admitting Physician Lavina Hamman, MD  Outpatient Primary MD for the patient is [name], MD  Outpatient specialists:   LOS - 1  days   Medical records reviewed and are as summarized below:    No chief complaint on file.      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 Julien Nordmann, presented to ED with right-sided chest pain ongoing for last 4 days.  Patient reports pain has been progressively getting worse and uncontrolled with his home pain medications.  He also reported difficulty breathing.  No fevers or chills.  Occasional dry coughing.  Patient has metastasis to the right shoulder, he had been referred to radiation oncology however had not been able to attend appointments due to severe pain and fatigue.  Patient has been unable to ambulate secondary to severe pain and has been mostly bedbound.  Patient reports decreased appetite, not eating or drinking anything well.   CT angiogram chest showed pulmonary embolism, patient was admitted for further work-up.   Assessment & Plan    Principal Problem: Acute respiratory failure with hypoxia likely secondary to pulmonary emboli ([place]), stage IV lung CA, metastatic -Still hypoxic, short of breath, remains on NRB, 15 L, O2 sats 95% -Per patient pain is slightly better from yesterday in his right chest and shoulder. -Continue IV Dilaudid, steroids, highly appreciate palliative medicine, Dr. Domingo Cocking for assisting in this patient's care. 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   -SLP evaluation once more hemodynamically stable, once off the NRB -Patient has been referred to radiation oncology for XRT of the shoulder and left supraclavicular mass but unable to make it to appointments due to severe pain.   - Once off the NRB and respiratory status better, should benefit from inpatient palliative XRT, will defer to oncology  Lactic acidosis -Likely due to dehydration, poor oral intake.  No illness or sepsis identified -SLP evaluation once more hemodynamically stable -BP now stable, saline lock IV fluids  Pressure injury Left anterior hip stage I -Continue wound care per nursing  Severe protein calorie malnutrition -In the context of acute illness, chronic malignancy, metastatic, lung CA -BMI 18.1  Code Status: DNR status per patient and his wife DVT Prophylaxis:  Lovenox therapeutic Family Communication: Discussed all imaging results, lab results, explained to the patient, discussed with patient's wife on 10/11   Disposition Plan: Remains inpatient, respiratory status still tenuous, on nonrebreather mask  Time spent: 35 minutes  Procedures:  None  Consultants:   Palliative medicine Oncology  Antimicrobials:   Anti-infectives (From admission, onward)   None         Medications  Scheduled Meds:  Chlorhexidine Gluconate Cloth  6 each Topical Daily   dexamethasone (DECADRON) injection  8 mg Intravenous Q24H   enoxaparin (LOVENOX) injection  100 mg Subcutaneous Q24H   feeding supplement  1 Container Oral BID BM   feeding supplement (PRO-STAT SUGAR FREE 64)  30 mL Oral BID   influenza vac split quadrivalent PF  0.5 mL Intramuscular Tomorrow-1000   mouth rinse   15 mL Mouth Rinse BID   mirtazapine  15 mg Oral QHS   multivitamin with minerals  1 tablet Oral Daily   polyethylene glycol  17 g Oral Daily   senna-docusate  1 tablet Oral BID   sodium chloride flush  3 mL Intravenous Q12H   Continuous Infusions:  sodium chloride 100 mL/hr at 05/17/19 1209   PRN Meds:.acetaminophen **OR** acetaminophen, bisacodyl, HYDROmorphone (DILAUDID) injection, LORazepam, metoprolol tartrate, ondansetron **OR** ondansetron (ZOFRAN) IV, phenol      Subjective:   Hadriel Northup was seen and examined today.  Still with difficulty breathing, on NRB mask 15 L.  Pain slightly better today, 7/10 in right chest and right shoulder, sharp and constant..  No nausea or vomiting.  No nausea vomiting, abdominal pain or diarrhea  Objective:   Vitals:   05/17/19 0900 05/17/19 1000 05/17/19 1100 05/17/19 1200  BP: (!) 123/96 122/88 (!) 150/98 (!) 128/94  Pulse: (!) 103 (!) 102 (!) 104 (!) 101  Resp: 18 16 20  (!) 29  Temp:      TempSrc:      SpO2: 98% 100% 90% 93%  Weight:      Height:        Intake/Output Summary (Last 24 hours) at 05/17/2019 1348 Last data filed at 05/17/2019 1209 Gross per 24 hour  Intake 2507.58 ml  Output 350 ml  Net 2157.58 ml     Wt Readings from Last 3 Encounters:  05/16/19 66.2 kg  04/27/19 74.6 kg  04/26/19 79.4 kg    Physical Exam  General: Alert and oriented x 3, still on NRB 15 L, short of breath  Eyes: PERRLA, EOMI, Anicteric Sclera,  HEENT:  Atraumatic, normocephalic  Cardiovascular: S1 S2 clear, NSR, tachycardia. No pedal edema b/l  Respiratory: Decreased breath sounds   Gastrointestinal: Soft, nontender, nondistended, NBS  Ext: no pedal edema bilaterally  Neuro: no new deficits  Musculoskeletal: No cyanosis, clubbing  Skin: No rashes  Psych: Normal affect and demeanor, alert and oriented x3    Data Reviewed:  I have personally reviewed following labs and imaging studies  Micro Results Recent  Results (from the past 240 hour(s))  Urine culture     Status: None   Collection Time: 05/11/2019  1:41 AM   Specimen: In/Out Cath Urine  Result Value Ref Range Status   Specimen Description   Final    IN/OUT CATH URINE Performed at [facility] [address], [place], [place] [address]    Special Requests   Final    NONE Performed at [facility], [place] [address] [place], [place] [address]    Culture   Final    NO GROWTH Performed at [facility], [place] [address] [place], [place] [address]    Report Status 05/16/2019 FINAL  Final  SARS Coronavirus 2 by RT PCR (hospital  order, performed in [facility] hospital lab) Nasopharyngeal Nasopharyngeal Swab     Status: None   Collection Time: 06/05/2019  1:42 AM   Specimen: Nasopharyngeal Swab  Result Value Ref Range Status   SARS Coronavirus 2 NEGATIVE NEGATIVE Final    Comment: (NOTE) If result is NEGATIVE SARS-CoV-2 target nucleic acids are NOT DETECTED. The SARS-CoV-2 RNA is generally detectable in upper and lower  respiratory specimens during the acute phase of infection. The lowest  concentration of SARS-CoV-2 viral copies this assay can detect is 250  copies / mL. A negative result does not preclude SARS-CoV-2 infection  and should not be used as the sole basis for treatment or other  patient management decisions.  A negative result may occur with  improper specimen collection / handling, submission of specimen other  than nasopharyngeal swab, presence of viral mutation(s) within the  areas targeted by this assay, and inadequate number of viral copies  (<250 copies / mL). A negative result must be combined with clinical  observations, patient history, and epidemiological information. If result is POSITIVE SARS-CoV-2 target nucleic acids are DETECTED. The SARS-CoV-2 RNA is generally detectable in upper and lower  respiratory specimens dur ing the acute phase of infection.  Positive   results are indicative of active infection with SARS-CoV-2.  Clinical  correlation with patient history and other diagnostic information is  necessary to determine patient infection status.  Positive results do  not rule out bacterial infection or co-infection with other viruses. If result is PRESUMPTIVE POSTIVE SARS-CoV-2 nucleic acids MAY BE PRESENT.   A presumptive positive result was obtained on the submitted specimen  and confirmed on repeat testing.  While 2019 novel coronavirus  (SARS-CoV-2) nucleic acids may be present in the submitted sample  additional confirmatory testing may be necessary for epidemiological  and / or clinical management purposes  to differentiate between  SARS-CoV-2 and other Sarbecovirus currently known to infect humans.  If clinically indicated additional testing with an alternate test  methodology [phone]) is advised. The SARS-CoV-2 RNA is generally  detectable in upper and lower respiratory sp ecimens during the acute  phase of infection. The expected result is Negative. Fact Sheet for Patients:  StrictlyIdeas.no Fact Sheet for Healthcare Providers: BankingDealers.co.za This test is not yet approved or cleared by the [place] FDA and has been authorized for detection and/or diagnosis of SARS-CoV-2 by FDA under an Emergency Use Authorization (EUA).  This EUA will remain in effect (meaning this test can be used) for the duration of the COVID-19 declaration under Section 564(b)(1) of the Act, 21 U.S.C. section 360bbb-3(b)(1), unless the authorization is terminated or revoked sooner. Performed at [facility], [place] [address] [place], [place] [address]   Blood Culture (routine x 2)     Status: None (Preliminary result)   Collection Time: 05/07/2019  1:59 AM   Specimen: BLOOD RIGHT WRIST  Result Value Ref Range Status   Specimen Description   Final    BLOOD RIGHT WRIST Performed at  [facility], [address] [place], [place] [address]    Special Requests   Final    BOTTLES DRAWN AEROBIC AND ANAEROBIC Blood Culture adequate volume Performed at [facility] [address] [place], [place] [address]    Culture   Final    NO GROWTH 2 DAYS Performed at [facility] [address] [place], [place] [address]    Report Status PENDING  Incomplete  Blood Culture (  routine x 2)     Status: None (Preliminary result)   Collection Time: 06/04/2019  2:00 AM   Specimen: BLOOD LEFT FOREARM  Result Value Ref Range Status   Specimen Description   Final    BLOOD LEFT FOREARM Performed at [facility], [place] [address], [place], [place] [address]    Special Requests   Final    BOTTLES DRAWN AEROBIC AND ANAEROBIC Blood Culture adequate volume Performed at [facility] [address], [place], [place] [address]    Culture   Final    NO GROWTH 2 DAYS Performed at [facility] [address] [place], [place] [address]    Report Status PENDING  Incomplete  MRSA PCR Screening     Status: None   Collection Time: 05/16/19 11:25 AM   Specimen: Nasal Mucosa; Nasopharyngeal  Result Value Ref Range Status   MRSA by PCR NEGATIVE NEGATIVE Final    Comment:        The GeneXpert MRSA Assay (FDA approved for NASAL specimens only), is one component of a comprehensive MRSA colonization surveillance program. It is not intended to diagnose MRSA infection nor to guide or monitor treatment for MRSA infections. Performed at [facility], [place] [address] [place], [place] [address]     Radiology Reports Ct Angio Chest Pe W And/or Wo Contrast  Result Date: 05/19/2019 CLINICAL DATA:  Per EMS, Pt called EMS for right 9/10 flank pain. Steadily increasing pain for the last 4 days. Pt has stage 4 lung cancer. Last radiation treatment was 05/11/2019. Pt has prescription for morphine, last took yesterday. Omni  350/.*comment was truncated*^182mL OMNIPAQUE IOHEXOL 350 MG/ML SOLNPE suspected, high pretest prob EXAM: CT ANGIOGRAPHY CHEST WITH CONTRAST TECHNIQUE: Multidetector CT imaging of the chest was performed using the standard protocol during bolus administration of intravenous contrast. Multiplanar CT image reconstructions and MIPs were obtained to evaluate the vascular anatomy. CONTRAST:  17mL OMNIPAQUE IOHEXOL 350 MG/ML SOLN COMPARISON:  04/20/2019, PET-CT FINDINGS: Cardiovascular: A tubular filling defect in the LEFT lower lobe pulmonary artery (image 57/4 consistent with thromboembolus. This is partially occlusive. Small embolus in the LEFT upper lobe pulmonary artery is partially wall adherent (image 37/4). No RIGHT ventricular strain evident. Mediastinum/Nodes: Bulky RIGHT axillary and RIGHT septic clavicular adenopathy again demonstrated. There is a mass of nodal tissue in the RIGHT supraclavicular region measuring 6.5 by 7.3 cm. This intense hypermetabolic on comparison PET scan. The nodal mass appears increased from roughly 5.4 x 5.4 cm. In the anterior mediastinum large prevascular lymph node measuring 3 cm similar. Small pericardial effusions increased. Lungs/Pleura: RIGHT upper lobe irregular mass measuring 3.6 cm not changed comparison exam. No pneumothorax. Pneumonia. Small focus of ground-glass density in the lateral aspect of the RIGHT middle lobe. Paraseptal emphysema the upper lobes. Upper Abdomen: Limited view of the liver, kidneys, pancreas are unremarkable. Normal adrenal glands. Musculoskeletal: No clear aggressive osseous lesion. Review of the MIP images confirms the above findings. IMPRESSION: 1. Small pulmonary emboli in the LEFT lower lobe and LEFT upper lobe. Overall clot burden mild. 2. Stable RIGHT upper lobe mass. 3. Bulky RIGHT axillary and supraclavicular adenopathy. 4. Interval increase in size of RIGHT supraclavicular nodal mass. 5. Small pericardial effusion increased. Aortic  Atherosclerosis (ICD10-I70.0) and Emphysema (ICD10-J43.9). Findings conveyed [facility] on 06/03/2019  at04:51. Electronically Signed   By: Suzy Bouchard M.D.   On: 05/12/2019 04:51   Nm Pet Image Initial (pi) Skull Base To Thigh  Result Date: 04/20/2019 CLINICAL DATA:  Initial treatment strategy for metastatic lung cancer. EXAM: NUCLEAR MEDICINE PET SKULL BASE TO THIGH TECHNIQUE: 8.74 mCi F-18 FDG was injected intravenously. Full-ring PET imaging was performed from the skull base to thigh after the radiotracer. CT data was obtained and used for attenuation correction and anatomic localization. Fasting blood glucose: 99 mg/dl COMPARISON:  CT chest, abdomen and pelvis 03/30/2019 FINDINGS: Mediastinal blood pool activity: SUV max 2.95 Liver activity: SUV max NA NECK: Extensive FDG avid adenopathy is identified within right cervical lymph node chain. Hypermetabolic lymph nodes are identified within the right level 2, 3, 4 and 5 stations. Index right level 2 node measures 1.1 cm within SUV max of 18.47. Index right level 3/4 lymph node measures 2.2 cm short axis and has an SUV max of 20.12. Incidental CT findings: none CHEST: Extensive right supraclavicular hypermetabolic adenopathy identified. Index right supraclavicular lymph node measures 2.9 cm with SUV max of 18.58. Hypermetabolic right retropectoral and right axillary lymph nodes identified. Right axillary node measures 1.9 cm short axis and has an SUV max of 26.4. Multiple left supraclavicular hypermetabolic lymph nodes are identified. Index lymph node measures 1.3 cm with SUV max of 12.59. Hypermetabolic bilateral mediastinal, subcarinal, and posterior mediastinal adenopathy identified: Right pre-vascular node measures 2.8 cm and has an SUV max of 14.1. Index left pre-vascular lymph node measures 1.5 cm with SUV max of 17.02. Subcarinal lymph node measures 1.2 cm within SUV max of 12.14. Posterior mediastinal lymph node between the aorta and  esophagus adjacent to the left mainstem bronchus measures 2.3 cm and has an SUV max of 15.35. Right apical lung mass with central cavitation measures 4 cm and has an SUV max of 16.38. Incidental CT findings: Advanced bullous emphysema. Small pericardial effusion. Mild aortic atherosclerosis. ABDOMEN/PELVIS: No abnormal hypermetabolic activity within the liver, pancreas, adrenal glands, or spleen. No hypermetabolic lymph nodes in the abdomen or pelvis. Incidental CT findings: Aortic atherosclerosis. SKELETON: 2 foci of chest wall involvement are identified involving the upper medial aspect of the right pectoralis musculature with SUV max of 11.7. This appears to be a direct extension from right supraclavicular tumor. On the left, there is asymmetric increased uptake along the left internal mammary lymph node chain with extension into the second left costosternal junction. No findings to suggest distant osseous metastatic disease within the axial or proximal appendicular skeleton. Incidental CT findings: none IMPRESSION: 1. Hypermetabolic right upper lobe lung mass is again noted compatible with primary bronchogenic carcinoma. 2. Bilateral mediastinal, bilateral supraclavicular, right subpectoral and right axillary, and right level 2, 3, 4, and 5 cervical hypermetabolic nodal metastasis. 3. Evidence of right upper chest wall involvement by tumor which is felt to be a direct extension from bulky right supraclavicular adenopathy. 4. Left chest wall involvement is also suspected at the left second costosternal junction. This may be the result of direct extension from left internal mammary nodal metastasis. Electronically Signed   By: Kerby Moors M.D.   On: 04/20/2019 14:11   Dg Chest Port 1 View  Result Date: 05/07/2019 CLINICAL DATA:  Flank pain. History of lung cancer. EXAM: PORTABLE CHEST 1 VIEW COMPARISON:  03/22/2019 chest radiograph and 03/30/2019 chest CT FINDINGS: Right apical opacities are consistent  with previously demonstrated cavitary mass. The lungs are otherwise clear. There is bullous emphysema apices. IMPRESSION: 1. No acute cardiopulmonary disease. 2. Unchanged appearance of the right apical cavitary mass. 3. Emphysema. Electronically Signed   By: Cletus Gash.D.  On: 05/27/2019 02:34    Lab Data:  CBC: Recent Labs  Lab 05/26/2019 0159 05/16/19 0901 05/17/19 0242  WBC 23.9* 20.1* 15.4*  NEUTROABS 19.1*  --   --   HGB 15.8 14.3 12.9*  HCT 48.9 45.3 40.7  MCV 92.8 95.0 96.2  PLT 98* 114* 875*   Basic Metabolic Panel: Recent Labs  Lab 06/05/2019 0159 05/16/19 0901 05/17/19 0242  NA 142 146* 146*  K 4.6 5.9* 3.8  CL 102 109 114*  CO2 24 22 21*  GLUCOSE 103* 66* 109*  BUN 40* 29* 25*  CREATININE 1.00 0.99 0.80  CALCIUM 10.3 9.2 8.8*   GFR: Estimated Creatinine Clearance: 105.7 mL/min (by C-G formula based on SCr of 0.8 mg/dL). Liver Function Tests: Recent Labs  Lab 06/04/2019 0159 05/16/19 0901  AST 19 35  ALT 22 25  ALKPHOS 110 102  BILITOT 1.7* 3.1*  PROT 7.6 6.5  ALBUMIN 3.3* 2.7*   No results for input(s): LIPASE, AMYLASE in the last 168 hours. No results for input(s): AMMONIA in the last 168 hours. Coagulation Profile: Recent Labs  Lab 05/09/2019 0159  INR 1.3*   Cardiac Enzymes: No results for input(s): CKTOTAL, CKMB, CKMBINDEX, TROPONINI in the last 168 hours. BNP (last 3 results) No results for input(s): PROBNP in the last 8760 hours. HbA1C: No results for input(s): HGBA1C in the last 72 hours. CBG: No results for input(s): GLUCAP in the last 168 hours. Lipid Profile: No results for input(s): CHOL, HDL, LDLCALC, TRIG, CHOLHDL, LDLDIRECT in the last 72 hours. Thyroid Function Tests: No results for input(s): TSH, T4TOTAL, FREET4, T3FREE, THYROIDAB in the last 72 hours. Anemia Panel: No results for input(s): VITAMINB12, FOLATE, FERRITIN, TIBC, IRON, RETICCTPCT in the last 72 hours. Urine analysis:    Component Value Date/Time    COLORURINE YELLOW 05/27/2019 0141   APPEARANCEUR CLEAR 05/09/2019 0141   LABSPEC 1.044 (H) 05/09/2019 0141   PHURINE 5.0 05/07/2019 0141   GLUCOSEU NEGATIVE 05/16/2019 0141   HGBUR MODERATE (A) 05/09/2019 0141   BILIRUBINUR NEGATIVE 06/01/2019 0141   KETONESUR NEGATIVE 05/07/2019 0141   PROTEINUR NEGATIVE 05/10/2019 0141   NITRITE NEGATIVE 05/23/2019 0141   LEUKOCYTESUR NEGATIVE 05/26/2019 0141     Hebah Bogosian M.D. [facility] 05/17/2019, 1:48 PM  Pager: [phone] Between 7am to 7pm - call Pager - [phone]  After 7pm go to [email or web address] - password [email or web address]  Call night coverage person covering after 7pm

## 2019-05-17 NOTE — Progress Notes (Signed)
PT Cancellation Note  Patient Details Name: Justin Randall MRN: [id number] DOB: Aug 02, 1971   Cancelled Treatment:    Reason Eval/Treat Not Completed: Other (comment) - Pt may be transitioning to comfort care, awaiting palliative. PT to check back PM per RN request.  Julien Girt, PT Acute Rehabilitation Services Pager [phone]  Office [phone]    Everette Dimauro D Bradshaw 05/17/2019, 10:12 AM

## 2019-05-17 NOTE — Evaluation (Signed)
Occupational Therapy Evaluation Patient Details Name: Justin Randall MRN: [id number] DOB: [birth year] Today's Date: 05/17/2019    History of Present Illness Justin Randall is a 48 y.o. male with Past medical history of stage IV non-small cell lung cancer.Patient presents with complaints of right-sided chest pain ongoing for last 4 days, shortness of breath. atient has metastasis to his right shoulder and for which she has been referred to radiation but is unable to attend the appointments secondary to severe pain and fatigue and he has been unable to ambulate secondary to severe pain.Found to have PE   Clinical Impression   This 48 yo male admitted with above presents to acute OT with good mobility but decreased endurance/increased WOB with activity thus affecting his ability to perform and A with basic ADls. He will benefit from acute OT with follow up at home v. Home with hospice without OT services.    Follow Up Recommendations  Supervision/Assistance - 24 hour;Home health OT(if pt goes home)    Equipment Recommendations  3 in 1 bedside commode;Hospital bed(if pt goes home)       Precautions / Restrictions Precautions Precautions: Fall Precaution Comments: watch vitals (especially O2 on NRB 15 liters) Restrictions Weight Bearing Restrictions: No      Mobility Bed Mobility Overal bed mobility: Needs Assistance Bed Mobility: Sit to Supine;Supine to Sit     Supine to sit: Min assist;HOB elevated Sit to supine: Min assist         Balance Overall balance assessment: Needs assistance     Sitting balance - Comments: sat in tripod position at EOB for ease of breathing for ~5 minutes       Standing balance comment: Did not want to attempt to stand                           ADL either performed or assessed with clinical judgement   ADL Overall ADL's : Needs assistance/impaired                                       General ADL Comments: PTA pt's  wife was doing sponge bath in bed and bed level dressing with pt needing some A with toileting as well     Vision Patient Visual Report: No change from baseline              Pertinent Vitals/Pain Pain Assessment: 0-10 Pain Score: 7  Pain Location: right shoulder Pain Descriptors / Indicators: Aching;Sore Pain Intervention(s): Limited activity within patient's tolerance;Monitored during session;Repositioned;Patient requesting pain meds-RN notified     Hand Dominance Right   Extremity/Trunk Assessment Upper Extremity Assessment Upper Extremity Assessment: RUE deficits/detail RUE Deficits / Details: painful at shoulder           Communication Communication Communication: Other (comment)(a little difficult to understand due to SOB and NRB)   Cognition Arousal/Alertness: Awake/alert Behavior During Therapy: WFL for tasks assessed/performed Overall Cognitive Status: Within Functional Limits for tasks assessed                                                Home Living Family/patient expects to be discharged to:: Private residence Living Arrangements: Spouse/significant other Available Help at Discharge: Family;Available 24 hours/day  Home Equipment: Cane - single point          Prior Functioning/Environment Level of Independence: Needs assistance    ADL's / Homemaking Assistance Needed: Wife did sponge bath in bed and dressed him in bed            OT Problem List: Decreased strength;Decreased range of motion;Impaired balance (sitting and/or standing);Cardiopulmonary status limiting activity      OT Treatment/Interventions: Self-care/ADL training;Therapeutic exercise;Patient/family education;Balance training    OT Goals(Current goals can be found in the care plan section) Acute Rehab OT Goals Patient Stated Goal: to sit up on EOB today OT Goal Formulation: With patient Time For Goal Achievement:  05/31/19 Potential to Achieve Goals: Fair  OT Frequency: Min 2X/week              AM-PAC OT "6 Clicks" Daily Activity     Outcome Measure Help from another person eating meals?: A Lot Help from another person taking care of personal grooming?: A Lot Help from another person toileting, which includes using toliet, bedpan, or urinal?: Total Help from another person bathing (including washing, rinsing, drying)?: A Lot Help from another person to put on and taking off regular upper body clothing?: A Lot Help from another person to put on and taking off regular lower body clothing?: Total 6 Click Score: 10   End of Session Nurse Communication: Patient requests pain meds  Activity Tolerance: Patient limited by fatigue;Patient limited by pain Patient left: in bed;with call bell/phone within reach;with bed alarm set;with family/visitor present  OT Visit Diagnosis: Unsteadiness on feet (R26.81);Other abnormalities of gait and mobility (R26.89);Muscle weakness (generalized) (M62.81)                Time: 5248-1859 OT Time Calculation (min): 13 min Charges:  OT General Charges $OT Visit: 1 Visit OT Evaluation $OT Eval Moderate Complexity: 1 Mod  [place] Circle, OTR/L Acute [employer] Pager [phone] Office [phone]     Almon Register 05/17/2019, 4:08 PM

## 2019-05-17 NOTE — Progress Notes (Signed)
Daily Progress Note   Patient Name: Justin Randall       Date: 05/17/2019 DOB: [birth year]  Age: 48 y.o. MRN#: [id number] Attending Physician: Mendel Corning, MD Primary Care Physician: [name], MD Admit Date: 05/09/2019  Reason for Consultation/Follow-up: Establishing goals of care  Subjective: I met today with patient and his wife.  He is more alert and interactive today.  Continues to report significant pain.  We discussed options for pain control and he is currently ordered both Dilaudid and morphine.  He feels that the Dilaudid is more effective than morphine is not controlling his pain.  While he certainly remains critically ill and remains on nonrebreather, he does appear to be more alert and comfortable than he was during my encounter with him yesterday.  We discussed plan to continue with current interventions and reassess his situation again tomorrow.  While he is currently not stable to consider radiation at this time, I do think that radiation therapy would be the most likely thing to benefit him long-term in regard to his pain management.  I was in touch with Worthy Flank from radiation oncology and they are aware of his admission.  Discussed plan to continue to monitor him daily and if he reaches a point where he appears stable enough to consider radiation, I would recommend them to begin treatments on him while an inpatient at that time.  Discussed this plan with Dr. Tana Coast as well as Mikey Bussing from oncology.  Length of Stay: 1  Current Medications: Scheduled Meds:  . Chlorhexidine Gluconate Cloth  6 each Topical Daily  . dexamethasone (DECADRON) injection  8 mg Intravenous Q24H  . enoxaparin (LOVENOX) injection  100 mg Subcutaneous Q24H  . feeding supplement  1  Container Oral BID BM  . feeding supplement (PRO-STAT SUGAR FREE 64)  30 mL Oral BID  . influenza vac split quadrivalent PF  0.5 mL Intramuscular Tomorrow-1000  . mouth rinse  15 mL Mouth Rinse BID  . mirtazapine  15 mg Oral QHS  . multivitamin with minerals  1 tablet Oral Daily  . polyethylene glycol  17 g Oral Daily  . senna-docusate  1 tablet Oral BID  . sodium chloride flush  3 mL Intravenous Q12H    Continuous Infusions:   PRN Meds: acetaminophen **OR** acetaminophen, bisacodyl, HYDROmorphone (  DILAUDID) injection, LORazepam, metoprolol tartrate, ondansetron **OR** ondansetron (ZOFRAN) IV, phenol  Physical Exam         General: Appears sleepy but arouses easily, in moderate respiratory distress, on nonrebreather  HEENT: No bruits, no goiter, no JVD Heart: Tachy. No murmur appreciated. Lungs: Decreased air movement, shallow breaths, coarse Abdomen: Soft, nontender, nondistended, positive bowel sounds.  Ext: No significant edema Skin: Warm and dry, cancer-related changes noted near right clavicle and neck   Vital Signs: BP (!) 128/94   Pulse (!) 101   Temp 97.6 F (36.4 C) (Axillary)   Resp (!) 29   Ht 6' 2"  (1.88 m)   Wt 66.2 kg   SpO2 93%   BMI 18.75 kg/m  SpO2: SpO2: 93 % O2 Device: O2 Device: NRB O2 Flow Rate: O2 Flow Rate (L/min): 15 L/min  Intake/output summary:   Intake/Output Summary (Last 24 hours) at 05/17/2019 1642 Last data filed at 05/17/2019 1209 Gross per 24 hour  Intake 2000.95 ml  Output 350 ml  Net 1650.95 ml   LBM: Last BM Date: 05/14/19 Baseline Weight: Weight: 73.5 kg Most recent weight: Weight: 66.2 kg       Palliative Assessment/Data:      Patient Active Problem List   Diagnosis Date Noted  . Acute respiratory failure with hypoxia ([place]) 05/16/2019  . Pulmonary emboli ([place]) 05/26/2019  . Pressure injury of skin 05/17/2019  . 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 Encounter for antineoplastic  immunotherapy 05/03/2019  . Cancer related pain 04/27/2019  . Encounter for antineoplastic chemotherapy 04/15/2019  . Goals of care, counseling/discussion 04/15/2019  . 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 Tobacco dependence 12/05/2017  . Depression 12/05/2017  . 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 Chronic right-sided low back pain with sciatica 09/11/2016    Palliative Care Assessment & Plan   Patient Profile: 48 y.o. male  with past medical history of metastatic stage IV non-small cell lung cancer who follows with Dr. Earlie Server admitted on 05/17/2019 with right-sided chest pain.  He has had significant pain for the past several weeks and has been mostly bedbound.  This is also prevented him from making it to several appointments.  On arrival, CT angio revealed pulmonary embolism.  He suffered from hypoxic respiratory failure and has transitioned to stepdown.  Palliative consulted for goals of care.  Assessment: Patient Active Problem List   Diagnosis Date Noted  . Acute respiratory failure with hypoxia ([place]) 05/16/2019  . Pulmonary emboli ([place]) 05/21/2019  . Pressure injury of skin 06/04/2019  . 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 Encounter for antineoplastic immunotherapy 05/03/2019  . Cancer related pain 04/27/2019  . Encounter for antineoplastic chemotherapy 04/15/2019  . Goals of care, counseling/discussion 04/15/2019  . 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 Tobacco dependence 12/05/2017  . Depression 12/05/2017  . 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 Chronic right-sided low back pain with sciatica 09/11/2016     Recommendations/Plan: - DNR/DNI. - Plan to continue current interventions and reassess his situation in 24 to 48 hours.  I another discussion with him and his wife regarding how concerned I am that he may continue to decline.  -He reports Dilaudid has been more effective than morphine for shortness of  breath and pain.  We will plan for IV Dilaudid 0.5 to 1 mg every 3 hours as needed. -Overall, I believe that radiation would be the best way to work to control his pain  if he reaches a point where his respiratory status improves.  Worthy Flank from radiation oncology reached out to touch base on his case today, and they are certainly willing to work to resume radiation while he is an inpatient if he reaches a point where he is stable enough to leave the floor to do so.  Please keep in touch with radiation oncology regarding possibility of starting inpatient radiation.  Code Status:    Code Status Orders  (From admission, onward)         Start     Ordered   05/16/19 0822  Do not attempt resuscitation (DNR)  Continuous    Question Answer Comment  In the event of cardiac or respiratory ARREST Do not call a "code blue"   In the event of cardiac or respiratory ARREST Do not perform Intubation, CPR, defibrillation or ACLS   In the event of cardiac or respiratory ARREST Use medication by any route, position, wound care, and other measures to relive pain and suffering. May use oxygen, suction and manual treatment of airway obstruction as needed for comfort.      05/16/19 1068        Code Status History    Date Active Date Inactive Code Status Order ID Comments User Context   05/23/2019 1055 05/16/2019 0821 Full Code [id number]  Lavina Hamman, MD Inpatient   Advance Care Planning Activity       Prognosis:   Guarded  Discharge Planning:  To Be Determined  Care plan was discussed with patient, wife, RN, Dr. Tana Coast, medical oncology, and radiation oncology  Thank you for allowing the Palliative Medicine Team to assist in the care of this patient.   Time In: 0940 Time Out: 1025 Total Time 45 Prolonged Time Billed No      Greater than 50%  of this time was spent counseling and coordinating care related to the above assessment and plan.  Micheline Rough, MD  Please contact Palliative  Medicine Team phone at [phone] for questions and concerns.

## 2019-05-17 NOTE — Progress Notes (Addendum)
HEMATOLOGY-ONCOLOGY PROGRESS NOTE  SUBJECTIVE: The patient remains short of breath this morning.  He is on 100% nonrebreather.  O2 sats are in the mid 90s.  He did ask to have his oxygen turned up despite the fact that is turned up as far as it will go.  Has ongoing shoulder pain.  States that he completed 2 out of 15 planned treatments of radiation.  He said that he could not complete his radiation due to severe pain making it difficult to get to his appointments.  States that he wants to go home.  Oncology History  Adenocarcinoma, lung, right ([place])  04/13/2019 Initial Diagnosis   Adenocarcinoma, lung, right ([place])   05/03/2019 - 05/03/2019 Chemotherapy   The patient had palonosetron (ALOXI) injection 0.25 mg, 0.25 mg, Intravenous,  Once, 0 of 7 cycles CARBOplatin (PARAPLATIN) 260 mg in sodium chloride 0.9 % 100 mL chemo infusion, 260 mg (100 % of original dose 256.6 mg), Intravenous,  Once, 0 of 7 cycles Dose modification: 256.6 mg (original dose 256.6 mg, Cycle 1) PACLitaxel (TAXOL) 90 mg in sodium chloride 0.9 % 250 mL chemo infusion (</= 80mg /m2), 45 mg/m2 = 90 mg, Intravenous,  Once, 0 of 7 cycles  for chemotherapy treatment.    05/10/2019 -  Chemotherapy   The patient had pembrolizumab (KEYTRUDA) 200 mg in sodium chloride 0.9 % 50 mL chemo infusion, 200 mg, Intravenous, Once, 0 of 8 cycles  for chemotherapy treatment.       REVIEW OF SYSTEMS:   Constitutional: Has intermittent fevers. Respiratory: He reports being very short of breath. Cardiovascular: Denies palpitation, chest discomfort Gastrointestinal:  Denies nausea, heartburn or change in bowel habits Skin: Denies abnormal skin rashes Lymphatics: Denies new lymphadenopathy or easy bruising Neurological:Denies numbness, tingling or new weaknesses MSK: Reports shoulder pain. Behavioral/Psych: Mood is stable, no new changes  Extremities: No lower extremity edema All other systems were reviewed with the patient and are  negative.  I have reviewed the past medical history, past surgical history, social history and family history with the patient and they are unchanged from previous note.   PHYSICAL EXAMINATION: ECOG PERFORMANCE STATUS: 3 - Symptomatic, >50% confined to bed  Vitals:   05/17/19 0750 05/17/19 0800  BP:  (!) 133/94  Pulse: (!) 108 (!) 108  Resp: (!) 22 17  Temp:  98.4 F (36.9 C)  SpO2: 95% 94%   Filed Weights   05/16/2019 0018 05/09/2019 1051 05/16/19 1120  Weight: 162 lb (73.5 kg) 141 lb (64 kg) 146 lb (66.2 kg)    Intake/Output from previous day: 10/11 0701 - 10/12 0700 In: 2407.8 [P.O.:60; I.V.:2256.4; IV Piggyback:91.5] Out: 600 [Urine:600]  GENERAL:alert, appears to be short of breath OROPHARYNX:no exudate, no erythema and lips, buccal mucosa, and tongue normal  LUNGS: Coarse rhonchi noted in the anterior lung fields. HEART: tachycardic, no murmurs ABDOMEN:abdomen soft, non-tender and normal bowel sounds Musculoskeletal:no cyanosis of digits and no clubbing  NEURO: alert & oriented x 3 with fluent speech, no focal motor/sensory deficits  LABORATORY DATA:  I have reviewed the data as listed CMP Latest Ref Rng & Units 05/17/2019 05/16/2019 05/08/2019  Glucose 70 - 99 mg/dL 109(H) 66(L) 103(H)  BUN 6 - 20 mg/dL 25(H) 29(H) 40(H)  Creatinine 0.61 - 1.24 mg/dL 0.80 0.99 1.00  Sodium 135 - 145 mmol/L 146(H) 146(H) 142  Potassium 3.5 - 5.1 mmol/L 3.8 5.9(H) 4.6  Chloride 98 - 111 mmol/L 114(H) 109 102  CO2 22 - 32 mmol/L 21(L) 22 24  Calcium 8.9 - 10.3 mg/dL 8.8(L) 9.2 10.3  Total Protein 6.5 - 8.1 g/dL - 6.5 7.6  Total Bilirubin 0.3 - 1.2 mg/dL - 3.1(H) 1.7(H)  Alkaline Phos 38 - 126 U/L - 102 110  AST 15 - 41 U/L - 35 19  ALT 0 - 44 U/L - 25 22    Lab Results  Component Value Date   WBC 15.4 (H) 05/17/2019   HGB 12.9 (L) 05/17/2019   HCT 40.7 05/17/2019   MCV 96.2 05/17/2019   PLT 121 (L) 05/17/2019   NEUTROABS 19.1 (H) 05/20/2019    Ct Angio Chest Pe W And/or  Wo Contrast  Result Date: 05/11/2019 CLINICAL DATA:  Per EMS, Pt called EMS for right 9/10 flank pain. Steadily increasing pain for the last 4 days. Pt has stage 4 lung cancer. Last radiation treatment was 05/11/2019. Pt has prescription for morphine, last took yesterday. Omni 350/.*comment was truncated*^123mL OMNIPAQUE IOHEXOL 350 MG/ML SOLNPE suspected, high pretest prob EXAM: CT ANGIOGRAPHY CHEST WITH CONTRAST TECHNIQUE: Multidetector CT imaging of the chest was performed using the standard protocol during bolus administration of intravenous contrast. Multiplanar CT image reconstructions and MIPs were obtained to evaluate the vascular anatomy. CONTRAST:  141mL OMNIPAQUE IOHEXOL 350 MG/ML SOLN COMPARISON:  04/20/2019, PET-CT FINDINGS: Cardiovascular: A tubular filling defect in the LEFT lower lobe pulmonary artery (image 57/4 consistent with thromboembolus. This is partially occlusive. Small embolus in the LEFT upper lobe pulmonary artery is partially wall adherent (image 37/4). No RIGHT ventricular strain evident. Mediastinum/Nodes: Bulky RIGHT axillary and RIGHT septic clavicular adenopathy again demonstrated. There is a mass of nodal tissue in the RIGHT supraclavicular region measuring 6.5 by 7.3 cm. This intense hypermetabolic on comparison PET scan. The nodal mass appears increased from roughly 5.4 x 5.4 cm. In the anterior mediastinum large prevascular lymph node measuring 3 cm similar. Small pericardial effusions increased. Lungs/Pleura: RIGHT upper lobe irregular mass measuring 3.6 cm not changed comparison exam. No pneumothorax. Pneumonia. Small focus of ground-glass density in the lateral aspect of the RIGHT middle lobe. Paraseptal emphysema the upper lobes. Upper Abdomen: Limited view of the liver, kidneys, pancreas are unremarkable. Normal adrenal glands. Musculoskeletal: No clear aggressive osseous lesion. Review of the MIP images confirms the above findings. IMPRESSION: 1. Small pulmonary emboli  in the LEFT lower lobe and LEFT upper lobe. Overall clot burden mild. 2. Stable RIGHT upper lobe mass. 3. Bulky RIGHT axillary and supraclavicular adenopathy. 4. Interval increase in size of RIGHT supraclavicular nodal mass. 5. Small pericardial effusion increased. Aortic Atherosclerosis (ICD10-I70.0) and Emphysema (ICD10-J43.9). Findings conveyed [facility] on 05/16/2019  at04:51. Electronically Signed   By: Suzy Bouchard M.D.   On: 05/18/2019 04:51   Nm Pet Image Initial (pi) Skull Base To Thigh  Result Date: 04/20/2019 CLINICAL DATA:  Initial treatment strategy for metastatic lung cancer. EXAM: NUCLEAR MEDICINE PET SKULL BASE TO THIGH TECHNIQUE: 8.74 mCi F-18 FDG was injected intravenously. Full-ring PET imaging was performed from the skull base to thigh after the radiotracer. CT data was obtained and used for attenuation correction and anatomic localization. Fasting blood glucose: 99 mg/dl COMPARISON:  CT chest, abdomen and pelvis 03/30/2019 FINDINGS: Mediastinal blood pool activity: SUV max 2.95 Liver activity: SUV max NA NECK: Extensive FDG avid adenopathy is identified within right cervical lymph node chain. Hypermetabolic lymph nodes are identified within the right level 2, 3, 4 and 5 stations. Index right level 2 node measures 1.1 cm within SUV max of 18.47. Index right level 3/4 lymph  node measures 2.2 cm short axis and has an SUV max of 20.12. Incidental CT findings: none CHEST: Extensive right supraclavicular hypermetabolic adenopathy identified. Index right supraclavicular lymph node measures 2.9 cm with SUV max of 18.58. Hypermetabolic right retropectoral and right axillary lymph nodes identified. Right axillary node measures 1.9 cm short axis and has an SUV max of 26.4. Multiple left supraclavicular hypermetabolic lymph nodes are identified. Index lymph node measures 1.3 cm with SUV max of 12.59. Hypermetabolic bilateral mediastinal, subcarinal, and posterior mediastinal adenopathy  identified: Right pre-vascular node measures 2.8 cm and has an SUV max of 14.1. Index left pre-vascular lymph node measures 1.5 cm with SUV max of 17.02. Subcarinal lymph node measures 1.2 cm within SUV max of 12.14. Posterior mediastinal lymph node between the aorta and esophagus adjacent to the left mainstem bronchus measures 2.3 cm and has an SUV max of 15.35. Right apical lung mass with central cavitation measures 4 cm and has an SUV max of 16.38. Incidental CT findings: Advanced bullous emphysema. Small pericardial effusion. Mild aortic atherosclerosis. ABDOMEN/PELVIS: No abnormal hypermetabolic activity within the liver, pancreas, adrenal glands, or spleen. No hypermetabolic lymph nodes in the abdomen or pelvis. Incidental CT findings: Aortic atherosclerosis. SKELETON: 2 foci of chest wall involvement are identified involving the upper medial aspect of the right pectoralis musculature with SUV max of 11.7. This appears to be a direct extension from right supraclavicular tumor. On the left, there is asymmetric increased uptake along the left internal mammary lymph node chain with extension into the second left costosternal junction. No findings to suggest distant osseous metastatic disease within the axial or proximal appendicular skeleton. Incidental CT findings: none IMPRESSION: 1. Hypermetabolic right upper lobe lung mass is again noted compatible with primary bronchogenic carcinoma. 2. Bilateral mediastinal, bilateral supraclavicular, right subpectoral and right axillary, and right level 2, 3, 4, and 5 cervical hypermetabolic nodal metastasis. 3. Evidence of right upper chest wall involvement by tumor which is felt to be a direct extension from bulky right supraclavicular adenopathy. 4. Left chest wall involvement is also suspected at the left second costosternal junction. This may be the result of direct extension from left internal mammary nodal metastasis. Electronically Signed   By: Kerby Moors M.D.    On: 04/20/2019 14:11   Dg Chest Port 1 View  Result Date: 05/29/2019 CLINICAL DATA:  Flank pain. History of lung cancer. EXAM: PORTABLE CHEST 1 VIEW COMPARISON:  03/22/2019 chest radiograph and 03/30/2019 chest CT FINDINGS: Right apical opacities are consistent with previously demonstrated cavitary mass. The lungs are otherwise clear. There is bullous emphysema apices. IMPRESSION: 1. No acute cardiopulmonary disease. 2. Unchanged appearance of the right apical cavitary mass. 3. Emphysema. Electronically Signed   By: Ulyses Jarred M.D.   On: 05/26/2019 02:34    ASSESSMENT AND PLAN: 1.  Metastatic non-small cell lung cancer, adenocarcinoma 2.  Acute respiratory failure with hypoxia 3.  Pulmonary emboli 4.  Painful right shoulder metastatic disease  -The patient remains extremely short of breath despite being on a 100% nonrebreather.  Appreciate assistance from the palliative care team regarding goals of care discussion.  The patient is not currently stable enough to undergo systemic treatment for his lung cancer. -If patient's respiratory status improves, I would recommend contacting radiation oncology to resume his radiation treatments to his right shoulder for palliation of his pain. -Continue current pain medications. -Continue Lovenox for now pending further goals of care discussion.  May go home with Lovenox if patient agreeable to  this and covered by insurance, but okay to transition to Eliquis or Xarelto upon discharge if consistent with goals of care.   LOS: 1 day   Mikey Bussing, DNP, AGPCNP-BC, AOCNP 05/17/19

## 2019-05-17 NOTE — Progress Notes (Signed)
OT Cancellation Note  Patient Details Name: Justin Randall MRN: [id number] DOB: 07-07-1971   Cancelled Treatment:    Reason Eval/Treat Not Completed: Other (comment). Spoke with RN and will check back later today to see how pt is once MD has rounded and perhaps more definitive as to whether will or will not be moving towards comfort care. Golden Circle, OTR/L Acute Rehab Services Pager [phone] Office [phone]     Almon Register 05/17/2019, 11:03 AM

## 2019-05-18 ENCOUNTER — Inpatient Hospital Stay (HOSPITAL_COMMUNITY): Payer: Medicare Other

## 2019-05-18 ENCOUNTER — Ambulatory Visit: Payer: Medicare Other

## 2019-05-18 DIAGNOSIS — I2694 Multiple subsegmental pulmonary emboli without acute cor pulmonale: Secondary | ICD-10-CM

## 2019-05-18 DIAGNOSIS — R609 Edema, unspecified: Secondary | ICD-10-CM

## 2019-05-18 DIAGNOSIS — R531 Weakness: Secondary | ICD-10-CM

## 2019-05-18 DIAGNOSIS — I2699 Other pulmonary embolism without acute cor pulmonale: Secondary | ICD-10-CM

## 2019-05-18 DIAGNOSIS — Z515 Encounter for palliative care: Secondary | ICD-10-CM

## 2019-05-18 LAB — CBC
HCT: 41.6 % (ref 39.0–52.0)
Hemoglobin: 13.6 g/dL (ref 13.0–17.0)
MCH: 30.4 pg (ref 26.0–34.0)
MCHC: 32.7 g/dL (ref 30.0–36.0)
MCV: 93.1 fL (ref 80.0–100.0)
Platelets: 126 10*3/uL — ABNORMAL LOW (ref 150–400)
RBC: 4.47 MIL/uL (ref 4.22–5.81)
RDW: 15.8 % — ABNORMAL HIGH (ref 11.5–15.5)
WBC: 15.7 10*3/uL — ABNORMAL HIGH (ref 4.0–10.5)
nRBC: 0.1 % (ref 0.0–0.2)

## 2019-05-18 LAB — BASIC METABOLIC PANEL
Anion gap: 12 (ref 5–15)
BUN: 25 mg/dL — ABNORMAL HIGH (ref 6–20)
CO2: 23 mmol/L (ref 22–32)
Calcium: 9.2 mg/dL (ref 8.9–10.3)
Chloride: 111 mmol/L (ref 98–111)
Creatinine, Ser: 0.8 mg/dL (ref 0.61–1.24)
GFR calc Af Amer: >60 mL/min (ref >60)
GFR calc non Af Amer: >60 mL/min (ref >60)
Glucose, Bld: 115 mg/dL — ABNORMAL HIGH (ref 70–99)
Potassium: 4.1 mmol/L (ref 3.5–5.1)
Sodium: 146 mmol/L — ABNORMAL HIGH (ref 135–145)

## 2019-05-18 LAB — BRAIN NATRIURETIC PEPTIDE: B Natriuretic Peptide: 39.7 pg/mL (ref 0.0–100.0)

## 2019-05-18 MED ORDER — POLYETHYLENE GLYCOL 3350 17 G PO PACK: 17.0000 g | PACK | Freq: Every day | ORAL | Status: DC | PRN

## 2019-05-18 MED ORDER — MIRTAZAPINE 15 MG PO TBDP
15.0000 mg | ORAL_TABLET | Freq: Every day | ORAL | Status: DC
  Administered 2019-05-18 – 2019-05-20 (×3): 15 mg via ORAL
  Filled 2019-05-18 (×3): qty 1

## 2019-05-18 MED ORDER — FUROSEMIDE 10 MG/ML IJ SOLN
40.0000 mg | Freq: Once | INTRAMUSCULAR | Status: AC
  Administered 2019-05-18: 08:00:00 40 mg via INTRAVENOUS
  Filled 2019-05-18: qty 4

## 2019-05-18 MED ORDER — NICOTINE 21 MG/24HR TD PT24
21.0000 mg | MEDICATED_PATCH | Freq: Every day | TRANSDERMAL | Status: DC
  Administered 2019-05-18 – 2019-05-22 (×5): 21 mg via TRANSDERMAL
  Filled 2019-05-18 (×5): qty 1

## 2019-05-18 MED ORDER — SODIUM CHLORIDE 0.9 % IV SOLN
3.0000 g | Freq: Four times a day (QID) | INTRAVENOUS | Status: DC
  Administered 2019-05-18 – 2019-05-21 (×11): 3 g via INTRAVENOUS
  Filled 2019-05-18 (×4): qty 3
  Filled 2019-05-18: qty 8
  Filled 2019-05-18 (×2): qty 3
  Filled 2019-05-18 (×2): qty 8
  Filled 2019-05-18 (×4): qty 3

## 2019-05-18 MED ORDER — RESOURCE THICKENUP CLEAR PO POWD
ORAL | Status: DC | PRN
  Filled 2019-05-18: qty 125

## 2019-05-18 NOTE — Progress Notes (Signed)
Bilateral lower extremity venous duplex has been completed. Preliminary results can be found in CV Proc through chart review.   05/18/19 8:48 AM Justin Randall RVT

## 2019-05-18 NOTE — Progress Notes (Signed)
Pharmacy Antibiotic Note  Justin Randall is a 48 y.o. male with metastatic lung cancer, known to pharmacy from current heparin consult.  CXR on 10/13 showed findings "consistent with infection or aspiration."  Pharmacy is consulted to start unasyn for aspiration PNA.   Plan: - unasyn 3 gm IV q6h - with stable renal function, pharmacy will sign off. Re-consult [place] if need further assistance  __________________________________________  Height: 6\' 2"  (188 cm) Weight: 146 lb (66.2 kg) IBW/kg (Calculated) : 82.2  Temp (24hrs), Avg:97.6 F (36.4 C), Min:97.1 F (36.2 C), Max:98 F (36.7 C)  Recent Labs  Lab 06/04/2019 0159 05/24/2019 0549 05/16/19 0901 05/17/19 0242 05/18/19 0224  WBC 23.9*  --  20.1* 15.4* 15.7*  CREATININE 1.00  --  0.99 0.80 0.80  LATICACIDVEN 2.2* 2.1*  --   --   --     Estimated Creatinine Clearance: 105.7 mL/min (by C-G formula based on SCr of 0.8 mg/dL).    Allergies  Allergen Reactions  . Fentanyl Nausea And Vomiting    Pt stopped med on his own -  Not feeling self  . Hydrocodone Other (See Comments)    Stays awake, cant sleep, decreased appetite  . Oxycodone     Nausea, "not myself"     Thank you for allowing pharmacy to be a part of this patient's care.  Lynelle Doctor 05/18/2019 3:28 PM

## 2019-05-18 NOTE — Progress Notes (Signed)
HEMATOLOGY-ONCOLOGY PROGRESS NOTE  SUBJECTIVE: Continues to report shortness of breath this morning.  Remains on 100% nonrebreather.  O2 sats during the time of visit ranged between 95 to 97% on the nonrebreather.  The patient did remove his mask to talk to me and did not drop his oxygen saturations.  Received a dose of Lasix earlier today.  Reports pain is currently controlled.  Afebrile.  States that he wants to go home for at least a half a day.  Reports that he does not want to stay in the hospital right now.  Oncology History  Adenocarcinoma, lung, right ([place])  04/13/2019 Initial Diagnosis   Adenocarcinoma, lung, right ([place])   05/03/2019 - 05/03/2019 Chemotherapy   The patient had palonosetron (ALOXI) injection 0.25 mg, 0.25 mg, Intravenous,  Once, 0 of 7 cycles CARBOplatin (PARAPLATIN) 260 mg in sodium chloride 0.9 % 100 mL chemo infusion, 260 mg (100 % of original dose 256.6 mg), Intravenous,  Once, 0 of 7 cycles Dose modification: 256.6 mg (original dose 256.6 mg, Cycle 1) PACLitaxel (TAXOL) 90 mg in sodium chloride 0.9 % 250 mL chemo infusion (</= 80mg /m2), 45 mg/m2 = 90 mg, Intravenous,  Once, 0 of 7 cycles  for chemotherapy treatment.    05/10/2019 -  Chemotherapy   The patient had pembrolizumab (KEYTRUDA) 200 mg in sodium chloride 0.9 % 50 mL chemo infusion, 200 mg, Intravenous, Once, 0 of 8 cycles  for chemotherapy treatment.       REVIEW OF SYSTEMS:   Constitutional: Denies fevers and chills Respiratory: Continues to report being very short of breath Cardiovascular: Denies palpitation, chest discomfort Gastrointestinal:  Denies nausea, heartburn or change in bowel habits Skin: Denies abnormal skin rashes Lymphatics: Denies new lymphadenopathy or easy bruising Neurological:Denies numbness, tingling or new weaknesses Behavioral/Psych: Mood is stable, no new changes  Extremities: No lower extremity edema All other systems were reviewed with the patient and are negative.  I  have reviewed the past medical history, past surgical history, social history and family history with the patient and they are unchanged from previous note.   PHYSICAL EXAMINATION: ECOG PERFORMANCE STATUS: 3 - Symptomatic, >50% confined to bed  Vitals:   05/18/19 0800 05/18/19 0828  BP: (!) 149/107   Pulse: 94   Resp: (!) 24   Temp:  97.9 F (36.6 C)  SpO2: 95%    Filed Weights   06/01/2019 0018 05/24/2019 1051 05/16/19 1120  Weight: 162 lb (73.5 kg) 141 lb (64 kg) 146 lb (66.2 kg)    Intake/Output from previous day: 10/12 0701 - 10/13 0700 In: 615.1 [I.V.:615.1] Out: 775 [Urine:775]  GENERAL:alert, appears short of breath LUNGS: diminished breath sounds HEART: Tachycardic, no LE edema ABDOMEN:abdomen soft, non-tender and normal bowel sounds Musculoskeletal:no cyanosis of digits and no clubbing  NEURO: alert & oriented x 3 with fluent speech, no focal motor/sensory deficits  LABORATORY DATA:  I have reviewed the data as listed CMP Latest Ref Rng & Units 05/18/2019 05/17/2019 05/16/2019  Glucose 70 - 99 mg/dL 115(H) 109(H) 66(L)  BUN 6 - 20 mg/dL 25(H) 25(H) 29(H)  Creatinine 0.61 - 1.24 mg/dL 0.80 0.80 0.99  Sodium 135 - 145 mmol/L 146(H) 146(H) 146(H)  Potassium 3.5 - 5.1 mmol/L 4.1 3.8 5.9(H)  Chloride 98 - 111 mmol/L 111 114(H) 109  CO2 22 - 32 mmol/L 23 21(L) 22  Calcium 8.9 - 10.3 mg/dL 9.2 8.8(L) 9.2  Total Protein 6.5 - 8.1 g/dL - - 6.5  Total Bilirubin 0.3 - 1.2 mg/dL - -  3.1(H)  Alkaline Phos 38 - 126 U/L - - 102  AST 15 - 41 U/L - - 35  ALT 0 - 44 U/L - - 25    Lab Results  Component Value Date   WBC 15.7 (H) 05/18/2019   HGB 13.6 05/18/2019   HCT 41.6 05/18/2019   MCV 93.1 05/18/2019   PLT 126 (L) 05/18/2019   NEUTROABS 19.1 (H) 05/07/2019    Ct Angio Chest Pe W And/or Wo Contrast  Result Date: 06/05/2019 CLINICAL DATA:  Per EMS, Pt called EMS for right 9/10 flank pain. Steadily increasing pain for the last 4 days. Pt has stage 4 lung cancer. Last  radiation treatment was 05/11/2019. Pt has prescription for morphine, last took yesterday. Omni 350/.*comment was truncated*^194mL OMNIPAQUE IOHEXOL 350 MG/ML SOLNPE suspected, high pretest prob EXAM: CT ANGIOGRAPHY CHEST WITH CONTRAST TECHNIQUE: Multidetector CT imaging of the chest was performed using the standard protocol during bolus administration of intravenous contrast. Multiplanar CT image reconstructions and MIPs were obtained to evaluate the vascular anatomy. CONTRAST:  144mL OMNIPAQUE IOHEXOL 350 MG/ML SOLN COMPARISON:  04/20/2019, PET-CT FINDINGS: Cardiovascular: A tubular filling defect in the LEFT lower lobe pulmonary artery (image 57/4 consistent with thromboembolus. This is partially occlusive. Small embolus in the LEFT upper lobe pulmonary artery is partially wall adherent (image 37/4). No RIGHT ventricular strain evident. Mediastinum/Nodes: Bulky RIGHT axillary and RIGHT septic clavicular adenopathy again demonstrated. There is a mass of nodal tissue in the RIGHT supraclavicular region measuring 6.5 by 7.3 cm. This intense hypermetabolic on comparison PET scan. The nodal mass appears increased from roughly 5.4 x 5.4 cm. In the anterior mediastinum large prevascular lymph node measuring 3 cm similar. Small pericardial effusions increased. Lungs/Pleura: RIGHT upper lobe irregular mass measuring 3.6 cm not changed comparison exam. No pneumothorax. Pneumonia. Small focus of ground-glass density in the lateral aspect of the RIGHT middle lobe. Paraseptal emphysema the upper lobes. Upper Abdomen: Limited view of the liver, kidneys, pancreas are unremarkable. Normal adrenal glands. Musculoskeletal: No clear aggressive osseous lesion. Review of the MIP images confirms the above findings. IMPRESSION: 1. Small pulmonary emboli in the LEFT lower lobe and LEFT upper lobe. Overall clot burden mild. 2. Stable RIGHT upper lobe mass. 3. Bulky RIGHT axillary and supraclavicular adenopathy. 4. Interval increase in  size of RIGHT supraclavicular nodal mass. 5. Small pericardial effusion increased. Aortic Atherosclerosis (ICD10-I70.0) and Emphysema (ICD10-J43.9). Findings conveyed [facility] on 05/14/2019  at04:51. Electronically Signed   By: Suzy Bouchard M.D.   On: 05/16/2019 04:51   Nm Pet Image Initial (pi) Skull Base To Thigh  Result Date: 04/20/2019 CLINICAL DATA:  Initial treatment strategy for metastatic lung cancer. EXAM: NUCLEAR MEDICINE PET SKULL BASE TO THIGH TECHNIQUE: 8.74 mCi F-18 FDG was injected intravenously. Full-ring PET imaging was performed from the skull base to thigh after the radiotracer. CT data was obtained and used for attenuation correction and anatomic localization. Fasting blood glucose: 99 mg/dl COMPARISON:  CT chest, abdomen and pelvis 03/30/2019 FINDINGS: Mediastinal blood pool activity: SUV max 2.95 Liver activity: SUV max NA NECK: Extensive FDG avid adenopathy is identified within right cervical lymph node chain. Hypermetabolic lymph nodes are identified within the right level 2, 3, 4 and 5 stations. Index right level 2 node measures 1.1 cm within SUV max of 18.47. Index right level 3/4 lymph node measures 2.2 cm short axis and has an SUV max of 20.12. Incidental CT findings: none CHEST: Extensive right supraclavicular hypermetabolic adenopathy identified. Index right supraclavicular lymph  node measures 2.9 cm with SUV max of 18.58. Hypermetabolic right retropectoral and right axillary lymph nodes identified. Right axillary node measures 1.9 cm short axis and has an SUV max of 26.4. Multiple left supraclavicular hypermetabolic lymph nodes are identified. Index lymph node measures 1.3 cm with SUV max of 12.59. Hypermetabolic bilateral mediastinal, subcarinal, and posterior mediastinal adenopathy identified: Right pre-vascular node measures 2.8 cm and has an SUV max of 14.1. Index left pre-vascular lymph node measures 1.5 cm with SUV max of 17.02. Subcarinal lymph node measures  1.2 cm within SUV max of 12.14. Posterior mediastinal lymph node between the aorta and esophagus adjacent to the left mainstem bronchus measures 2.3 cm and has an SUV max of 15.35. Right apical lung mass with central cavitation measures 4 cm and has an SUV max of 16.38. Incidental CT findings: Advanced bullous emphysema. Small pericardial effusion. Mild aortic atherosclerosis. ABDOMEN/PELVIS: No abnormal hypermetabolic activity within the liver, pancreas, adrenal glands, or spleen. No hypermetabolic lymph nodes in the abdomen or pelvis. Incidental CT findings: Aortic atherosclerosis. SKELETON: 2 foci of chest wall involvement are identified involving the upper medial aspect of the right pectoralis musculature with SUV max of 11.7. This appears to be a direct extension from right supraclavicular tumor. On the left, there is asymmetric increased uptake along the left internal mammary lymph node chain with extension into the second left costosternal junction. No findings to suggest distant osseous metastatic disease within the axial or proximal appendicular skeleton. Incidental CT findings: none IMPRESSION: 1. Hypermetabolic right upper lobe lung mass is again noted compatible with primary bronchogenic carcinoma. 2. Bilateral mediastinal, bilateral supraclavicular, right subpectoral and right axillary, and right level 2, 3, 4, and 5 cervical hypermetabolic nodal metastasis. 3. Evidence of right upper chest wall involvement by tumor which is felt to be a direct extension from bulky right supraclavicular adenopathy. 4. Left chest wall involvement is also suspected at the left second costosternal junction. This may be the result of direct extension from left internal mammary nodal metastasis. Electronically Signed   By: Kerby Moors M.D.   On: 04/20/2019 14:11   Dg Chest Port 1 View  Result Date: 05/18/2019 CLINICAL DATA:  Dyspnea EXAM: PORTABLE CHEST 1 VIEW COMPARISON:  05/09/2019 FINDINGS: There is new and  increased heterogeneous airspace opacity of the bilateral lung bases, particularly the right lung base. There is an unchanged, masslike opacity of the right upper lobe. The heart and mediastinum are unremarkable. IMPRESSION: 1. There is new and increased heterogeneous airspace opacity of the bilateral lung bases, particularly the right lung base, consistent with infection or aspiration. 2. Unchanged right upper lobe masslike opacity. Electronically Signed   By: Eddie Candle M.D.   On: 05/18/2019 09:05   Dg Chest Port 1 View  Result Date: 05/14/2019 CLINICAL DATA:  Flank pain. History of lung cancer. EXAM: PORTABLE CHEST 1 VIEW COMPARISON:  03/22/2019 chest radiograph and 03/30/2019 chest CT FINDINGS: Right apical opacities are consistent with previously demonstrated cavitary mass. The lungs are otherwise clear. There is bullous emphysema apices. IMPRESSION: 1. No acute cardiopulmonary disease. 2. Unchanged appearance of the right apical cavitary mass. 3. Emphysema. Electronically Signed   By: Ulyses Jarred M.D.   On: 05/07/2019 02:34   Vas [place] Lower Extremity Venous (dvt)  Result Date: 05/18/2019  Lower Venous Study Indications: Edema.  Risk Factors: None identified. Comparison Study: No prior studies. Performing Technologist: Oliver Hum RVT  Examination Guidelines: A complete evaluation includes B-mode imaging, spectral Doppler, color Doppler, and  power Doppler as needed of all accessible portions of each vessel. Bilateral testing is considered an integral part of a complete examination. Limited examinations for reoccurring indications may be performed as noted.  +---------+---------------+---------+-----------+----------+--------------+ RIGHT    CompressibilityPhasicitySpontaneityPropertiesThrombus Aging +---------+---------------+---------+-----------+----------+--------------+ CFV      Full           Yes      Yes                                  +---------+---------------+---------+-----------+----------+--------------+ SFJ      Full                                                        +---------+---------------+---------+-----------+----------+--------------+ FV Prox  Full                                                        +---------+---------------+---------+-----------+----------+--------------+ FV Mid   Full                                                        +---------+---------------+---------+-----------+----------+--------------+ FV DistalFull                                                        +---------+---------------+---------+-----------+----------+--------------+ PFV      Full                                                        +---------+---------------+---------+-----------+----------+--------------+ POP      Full           Yes      Yes                                 +---------+---------------+---------+-----------+----------+--------------+ PTV      Full                                                        +---------+---------------+---------+-----------+----------+--------------+ PERO     Full                                                        +---------+---------------+---------+-----------+----------+--------------+   +---------+---------------+---------+-----------+----------+--------------+ LEFT     CompressibilityPhasicitySpontaneityPropertiesThrombus Aging +---------+---------------+---------+-----------+----------+--------------+ CFV  Full           Yes      Yes                                 +---------+---------------+---------+-----------+----------+--------------+ SFJ      Full                                                        +---------+---------------+---------+-----------+----------+--------------+ FV Prox  Full                                                         +---------+---------------+---------+-----------+----------+--------------+ FV Mid   Full                                                        +---------+---------------+---------+-----------+----------+--------------+ FV DistalFull                                                        +---------+---------------+---------+-----------+----------+--------------+ PFV      Full                                                        +---------+---------------+---------+-----------+----------+--------------+ POP      Full           Yes      Yes                                 +---------+---------------+---------+-----------+----------+--------------+ PTV      Full                                                        +---------+---------------+---------+-----------+----------+--------------+ PERO     Full                                                        +---------+---------------+---------+-----------+----------+--------------+     Summary: Right: There is no evidence of deep vein thrombosis in the lower extremity. No cystic structure found in the popliteal fossa. Left: There is no evidence of deep vein thrombosis in the lower extremity. No cystic structure found in the popliteal fossa.  *See table(s) above for measurements and observations.    Preliminary  ASSESSMENT AND PLAN: 1.  Metastatic non-small cell lung cancer, adenocarcinoma 2.  Acute respiratory failure with hypoxia 3.  Pulmonary emboli 4.  Painful right shoulder metastatic disease  -Continues to report significant shortness of breath despite being on 100% NRB. O2 sats remain in the mid to upper 90's even when he removes the mask to talk. I have spoken with nursing who will try high flow O2 via [place] later today to see if he tolerates. The patient is positive 3L this admission. Received Lasix this am. CXR pending.  -Pain is currently controlled with current meds. I have spoken with Radiation Oncology  who is aware of the patient's admission. Will see how he does later today with trying high flow O2. If he does not drop his O2 sats, we will see if radiation oncology can resume his palliative radiation. -Continue Lovenox.   LOS: 2 days   Mikey Bussing, DNP, AGPCNP-BC, AOCNP 05/18/19

## 2019-05-18 NOTE — Progress Notes (Signed)
[facility]                                                                              Patient Demographics  Justin Randall, is a 48 y.o. male, DOB - 1971-01-30, KDT:[id number]  Admit date - 05/23/2019   Admitting Physician Lavina Hamman, MD  Outpatient Primary MD for the patient is [name], MD  Outpatient specialists:   LOS - 2  days   Medical records reviewed and are as summarized below:    No chief complaint on file.      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 Julien Nordmann, presented to ED with right-sided chest pain ongoing for last 4 days.  Patient reports pain has been progressively getting worse and uncontrolled with his home pain medications.  He also reported difficulty breathing.  No fevers or chills.  Occasional dry coughing.  Patient has metastasis to the right shoulder, he had been referred to radiation oncology however had not been able to attend appointments due to severe pain and fatigue.  Patient has been unable to ambulate secondary to severe pain and has been mostly bedbound.  Patient reports decreased appetite, not eating or drinking anything well.   CT angiogram chest showed pulmonary embolism, patient was admitted for further work-up.   Assessment & Plan    Principal Problem: Acute respiratory failure with hypoxia likely secondary to pulmonary emboli ([place]), stage IV lung CA, metastatic, aspiration pneumonia -Still hypoxic, short of breath, diffuse rhonchi, on nonrebreather -Continue IV Dilaudid, steroids, highly appreciate palliative medicine, Dr. Domingo Cocking for assisting in this patient's care. -Continue therapeutic Lovenox -BNP 39.7.  Stat chest x-ray showed new and increased heterogenicity airspace opacity of the bilateral lung bases, particularly right lung base consistent with infection or aspiration. 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   - Once off the NRB and respiratory status better, should benefit from inpatient palliative XRT, radiation oncology has been consulted -Continue pain control with Dilaudid, IV steroids  Lactic acidosis -Likely due to dehydration, poor oral intake.  No illness or sepsis identified -Patient received IV fluid hydration  Pressure injury Left anterior hip stage I -Continue wound care per nursing  Severe protein calorie malnutrition -In the context of acute illness, chronic malignancy, metastatic, lung CA -BMI 18.1  Code Status: DNR status per patient and his wife DVT Prophylaxis:  Lovenox therapeutic Family Communication: Discussed all imaging results, lab results, explained to the patient and wife on phone  Disposition Plan: Remains inpatient, respiratory status still tenuous, on nonrebreather mask  Time spent: 35 minutes  Procedures:  None  Consultants:   Palliative medicine Oncology Radiation oncology has been notified and aware of the patient being in the hospital  Antimicrobials:   Anti-infectives (From admission, onward)   Start     Dose/Rate Route Frequency Ordered Stop    05/18/19 1600  Ampicillin-Sulbactam (UNASYN) 3 g in sodium chloride 0.9 % 100 mL IVPB     3 g 200 mL/hr over 30 Minutes Intravenous Every 6 hours 05/18/19 1535           Medications  Scheduled Meds:  Chlorhexidine Gluconate Cloth  6 each Topical Daily   dexamethasone (DECADRON) injection  8 mg Intravenous Q24H   enoxaparin (LOVENOX) injection  100 mg Subcutaneous Q24H   feeding supplement  1 Container Oral BID BM   feeding supplement (PRO-STAT SUGAR FREE 64)  30 mL Oral BID   influenza vac split quadrivalent PF  0.5 mL Intramuscular Tomorrow-1000   mouth rinse  15 mL Mouth Rinse BID   mirtazapine  15 mg Oral QHS   senna-docusate  1 tablet Oral BID   sodium chloride flush  3 mL Intravenous Q12H   Continuous Infusions:  ampicillin-sulbactam (UNASYN) IV     PRN Meds:.acetaminophen **OR** acetaminophen, bisacodyl, HYDROmorphone (DILAUDID) injection, LORazepam, metoprolol tartrate, ondansetron **OR** ondansetron (ZOFRAN) IV, phenol, polyethylene glycol, Resource ThickenUp Clear      Subjective:   Augusta Hilbert was seen and examined today.  Continues to have difficulty breathing, coughing and short of breath, on NRB mask.  Lungs sound rhonchorous.  Denies any nausea or vomiting or abdominal pain or diarrhea.  Pain in the right chest, shoulder currently better controlled per patient.  No fevers.  Objective:   Vitals:   05/18/19 0600 05/18/19 0800 05/18/19 0828 05/18/19 1200  BP: 132/90 (!) 149/107  (!) 130/103  Pulse: (!) 103 94  (!) 113  Resp: 15 (!) 24  15  Temp:   97.9 F (36.6 C)   TempSrc:      SpO2: 95% 95%  97%  Weight:      Height:        Intake/Output Summary (Last 24 hours) at 05/18/2019 1540 Last data filed at 05/18/2019 0600 Gross per 24 hour  Intake --  Output 775 ml  Net -775 ml     Wt Readings from Last 3 Encounters:  05/16/19 66.2 kg  04/27/19 74.6 kg  04/26/19 79.4 kg   Physical Exam  General: Alert and oriented x 3, short of  breath, on nonrebreather  Eyes:   HEENT:  Atraumatic, normocephalic  Cardiovascular: S1 S2 clear, RRR. No pedal edema b/l  Respiratory: Coarse breath sounds throughout bilaterally  Gastrointestinal: Soft, nontender, nondistended, NBS  Ext: no pedal edema bilaterally  Neuro: no new deficits  Musculoskeletal: No cyanosis, clubbing  Skin: No rashes  Psych: Normal affect and demeanor, alert and oriented x3    Data Reviewed:  I have personally reviewed following labs and imaging studies  Micro Results Recent Results (from the past 240 hour(s))  Urine culture     Status: None   Collection Time: 05/12/2019  1:41 AM   Specimen: In/Out Cath Urine  Result Value Ref Range Status   Specimen Description   Final    IN/OUT CATH URINE Performed at [facility]  [address] [place], [place] [address]    Special Requests   Final    NONE Performed at [facility], [place] [address], [place], [place] [address]    Culture   Final    NO GROWTH Performed at [facility], [place] [address], [place], [place] [address]    Report Status 05/16/2019 FINAL  Final  SARS Coronavirus 2 by RT PCR (hospital order, performed in [facility] hospital lab) Nasopharyngeal Nasopharyngeal Swab     Status: None   Collection Time: 05/31/2019  1:42 AM   Specimen: Nasopharyngeal Swab  Result Value Ref Range Status   SARS Coronavirus 2 NEGATIVE NEGATIVE Final    Comment: (NOTE) If result is NEGATIVE SARS-CoV-2 target nucleic acids are NOT DETECTED. The SARS-CoV-2 RNA is generally detectable in upper and lower  respiratory specimens during the acute phase of infection. The lowest  concentration of SARS-CoV-2 viral copies this assay can detect is 250  copies / mL. A negative result does not preclude SARS-CoV-2 infection  and should not be used as the sole basis for treatment or other  patient management decisions.  A negative result may occur with  improper specimen  collection / handling, submission of specimen other  than nasopharyngeal swab, presence of viral mutation(s) within the  areas targeted by this assay, and inadequate number of viral copies  (<250 copies / mL). A negative result must be combined with clinical  observations, patient history, and epidemiological information. If result is POSITIVE SARS-CoV-2 target nucleic acids are DETECTED. The SARS-CoV-2 RNA is generally detectable in upper and lower  respiratory specimens dur ing the acute phase of infection.  Positive  results are indicative of active infection with SARS-CoV-2.  Clinical  correlation with patient history and other diagnostic information is  necessary to determine patient infection status.  Positive results do  not rule out bacterial infection or co-infection with other viruses. If result is PRESUMPTIVE POSTIVE SARS-CoV-2 nucleic acids MAY BE PRESENT.   A presumptive positive result was obtained on the submitted specimen  and confirmed on repeat testing.  While 2019 novel coronavirus  (SARS-CoV-2) nucleic acids may be present in the submitted sample  additional confirmatory testing may be necessary for epidemiological  and / or clinical management purposes  to differentiate between  SARS-CoV-2 and other Sarbecovirus currently known to infect humans.  If clinically indicated additional testing with an alternate test  methodology [id number]) is advised. The SARS-CoV-2 RNA is generally  detectable in upper and lower respiratory sp ecimens during the acute  phase of infection. The expected result is Negative. Fact Sheet for Patients:  StrictlyIdeas.no Fact Sheet for Healthcare Providers: BankingDealers.co.za This test is not yet approved or cleared by the [place] FDA and has been authorized for detection and/or diagnosis of SARS-CoV-2 by FDA under an Emergency Use Authorization (EUA).  This EUA will remain in effect  (meaning this test can be used) for the duration of the COVID-19 declaration under Section 564(b)(1) of the Act, 21 U.S.C. section 360bbb-3(b)(1), unless the authorization is terminated or revoked sooner. Performed at [facility], [place] [address] [place], [place] [address]   Blood Culture (routine x 2)     Status: None (Preliminary result)   Collection Time: 05/24/2019  1:59 AM   Specimen: BLOOD RIGHT WRIST  Result Value Ref Range Status   Specimen Description   Final    BLOOD RIGHT WRIST Performed at [facility], [address] [place], [place]  [address]    Special Requests   Final    BOTTLES DRAWN AEROBIC AND ANAEROBIC Blood Culture adequate volume Performed at [facility] [address] [place], [place] [address]    Culture   Final    NO GROWTH 3 DAYS Performed at [facility], [place] [address] [place], [place] [address]    Report Status PENDING  Incomplete  Blood Culture (routine x 2)     Status: None (Preliminary result)   Collection Time: 05/26/2019  2:00 AM   Specimen: BLOOD LEFT FOREARM  Result Value Ref Range Status   Specimen Description   Final    BLOOD LEFT FOREARM Performed at [facility], [place] [address], [place], [place] [address]    Special Requests   Final    BOTTLES DRAWN AEROBIC AND ANAEROBIC Blood Culture adequate volume Performed at [facility] [address], [place], [place] [address]    Culture   Final    NO GROWTH 3 DAYS Performed at [facility], [place] [address], [place], [place] [address]    Report Status PENDING  Incomplete  MRSA PCR Screening     Status: None   Collection Time: 05/16/19 11:25 AM   Specimen: Nasal Mucosa; Nasopharyngeal  Result Value Ref Range Status   MRSA by PCR NEGATIVE NEGATIVE Final    Comment:        The GeneXpert MRSA Assay (FDA approved for NASAL specimens only), is one component of a comprehensive MRSA colonization surveillance  program. It is not intended to diagnose MRSA infection nor to guide or monitor treatment for MRSA infections. Performed at [facility], [place] [address], [place], [place] [address]     Radiology Reports Ct Angio Chest Pe W And/or Wo Contrast  Result Date: 05/12/2019 CLINICAL DATA:  Per EMS, Pt called EMS for right 9/10 flank pain. Steadily increasing pain for the last 4 days. Pt has stage 4 lung cancer. Last radiation treatment was 05/11/2019. Pt has prescription for morphine, last took yesterday. Omni 350/.*comment was truncated*^116mL OMNIPAQUE IOHEXOL 350 MG/ML SOLNPE suspected, high pretest prob EXAM: CT ANGIOGRAPHY CHEST WITH CONTRAST TECHNIQUE: Multidetector CT imaging of the chest was performed using the standard protocol during bolus administration of intravenous contrast. Multiplanar CT image reconstructions and MIPs were obtained to evaluate the vascular anatomy. CONTRAST:  153mL OMNIPAQUE IOHEXOL 350 MG/ML SOLN COMPARISON:  04/20/2019, PET-CT FINDINGS: Cardiovascular: A tubular filling defect in the LEFT lower lobe pulmonary artery (image 57/4 consistent with thromboembolus. This is partially occlusive. Small embolus in the LEFT upper lobe pulmonary artery is partially wall adherent (image 37/4). No RIGHT ventricular strain evident. Mediastinum/Nodes: Bulky RIGHT axillary and RIGHT septic clavicular adenopathy again demonstrated. There is a mass of nodal tissue in the RIGHT supraclavicular region measuring 6.5 by 7.3 cm. This intense hypermetabolic on comparison PET scan. The nodal mass appears increased from roughly 5.4 x 5.4 cm. In the anterior mediastinum large prevascular lymph node measuring 3 cm similar. Small pericardial effusions increased. Lungs/Pleura: RIGHT upper lobe irregular mass measuring 3.6 cm not changed comparison exam. No pneumothorax. Pneumonia. Small focus of ground-glass density in the lateral aspect of the RIGHT middle lobe. Paraseptal emphysema the  upper lobes. Upper Abdomen: Limited view of the liver, kidneys, pancreas are unremarkable. Normal adrenal glands. Musculoskeletal: No clear aggressive osseous lesion. Review of the MIP images confirms the above findings. IMPRESSION: 1. Small pulmonary emboli in the LEFT lower lobe and LEFT upper lobe.  Overall clot burden mild. 2. Stable RIGHT upper lobe mass. 3. Bulky RIGHT axillary and supraclavicular adenopathy. 4. Interval increase in size of RIGHT supraclavicular nodal mass. 5. Small pericardial effusion increased. Aortic Atherosclerosis (ICD10-I70.0) and Emphysema (ICD10-J43.9). Findings conveyed [facility] on 05/29/2019  at04:51. Electronically Signed   By: Suzy Bouchard M.D.   On: 05/30/2019 04:51   Nm Pet Image Initial (pi) Skull Base To Thigh  Result Date: 04/20/2019 CLINICAL DATA:  Initial treatment strategy for metastatic lung cancer. EXAM: NUCLEAR MEDICINE PET SKULL BASE TO THIGH TECHNIQUE: 8.74 mCi F-18 FDG was injected intravenously. Full-ring PET imaging was performed from the skull base to thigh after the radiotracer. CT data was obtained and used for attenuation correction and anatomic localization. Fasting blood glucose: 99 mg/dl COMPARISON:  CT chest, abdomen and pelvis 03/30/2019 FINDINGS: Mediastinal blood pool activity: SUV max 2.95 Liver activity: SUV max NA NECK: Extensive FDG avid adenopathy is identified within right cervical lymph node chain. Hypermetabolic lymph nodes are identified within the right level 2, 3, 4 and 5 stations. Index right level 2 node measures 1.1 cm within SUV max of 18.47. Index right level 3/4 lymph node measures 2.2 cm short axis and has an SUV max of 20.12. Incidental CT findings: none CHEST: Extensive right supraclavicular hypermetabolic adenopathy identified. Index right supraclavicular lymph node measures 2.9 cm with SUV max of 18.58. Hypermetabolic right retropectoral and right axillary lymph nodes identified. Right axillary node measures 1.9  cm short axis and has an SUV max of 26.4. Multiple left supraclavicular hypermetabolic lymph nodes are identified. Index lymph node measures 1.3 cm with SUV max of 12.59. Hypermetabolic bilateral mediastinal, subcarinal, and posterior mediastinal adenopathy identified: Right pre-vascular node measures 2.8 cm and has an SUV max of 14.1. Index left pre-vascular lymph node measures 1.5 cm with SUV max of 17.02. Subcarinal lymph node measures 1.2 cm within SUV max of 12.14. Posterior mediastinal lymph node between the aorta and esophagus adjacent to the left mainstem bronchus measures 2.3 cm and has an SUV max of 15.35. Right apical lung mass with central cavitation measures 4 cm and has an SUV max of 16.38. Incidental CT findings: Advanced bullous emphysema. Small pericardial effusion. Mild aortic atherosclerosis. ABDOMEN/PELVIS: No abnormal hypermetabolic activity within the liver, pancreas, adrenal glands, or spleen. No hypermetabolic lymph nodes in the abdomen or pelvis. Incidental CT findings: Aortic atherosclerosis. SKELETON: 2 foci of chest wall involvement are identified involving the upper medial aspect of the right pectoralis musculature with SUV max of 11.7. This appears to be a direct extension from right supraclavicular tumor. On the left, there is asymmetric increased uptake along the left internal mammary lymph node chain with extension into the second left costosternal junction. No findings to suggest distant osseous metastatic disease within the axial or proximal appendicular skeleton. Incidental CT findings: none IMPRESSION: 1. Hypermetabolic right upper lobe lung mass is again noted compatible with primary bronchogenic carcinoma. 2. Bilateral mediastinal, bilateral supraclavicular, right subpectoral and right axillary, and right level 2, 3, 4, and 5 cervical hypermetabolic nodal metastasis. 3. Evidence of right upper chest wall involvement by tumor which is felt to be a direct extension from bulky  right supraclavicular adenopathy. 4. Left chest wall involvement is also suspected at the left second costosternal junction. This may be the result of direct extension from left internal mammary nodal metastasis. Electronically Signed   By: Kerby Moors M.D.   On: 04/20/2019 14:11   Dg Chest Port 1 View  Result Date: 05/18/2019  CLINICAL DATA:  Dyspnea EXAM: PORTABLE CHEST 1 VIEW COMPARISON:  05/08/2019 FINDINGS: There is new and increased heterogeneous airspace opacity of the bilateral lung bases, particularly the right lung base. There is an unchanged, masslike opacity of the right upper lobe. The heart and mediastinum are unremarkable. IMPRESSION: 1. There is new and increased heterogeneous airspace opacity of the bilateral lung bases, particularly the right lung base, consistent with infection or aspiration. 2. Unchanged right upper lobe masslike opacity. Electronically Signed   By: Eddie Candle M.D.   On: 05/18/2019 09:05   Dg Chest Port 1 View  Result Date: 05/09/2019 CLINICAL DATA:  Flank pain. History of lung cancer. EXAM: PORTABLE CHEST 1 VIEW COMPARISON:  03/22/2019 chest radiograph and 03/30/2019 chest CT FINDINGS: Right apical opacities are consistent with previously demonstrated cavitary mass. The lungs are otherwise clear. There is bullous emphysema apices. IMPRESSION: 1. No acute cardiopulmonary disease. 2. Unchanged appearance of the right apical cavitary mass. 3. Emphysema. Electronically Signed   By: Ulyses Jarred M.D.   On: 05/07/2019 02:34   Vas [place] Lower Extremity Venous (dvt)  Result Date: 05/18/2019  Lower Venous Study Indications: Edema.  Risk Factors: None identified. Comparison Study: No prior studies. Performing Technologist: Oliver Hum RVT  Examination Guidelines: A complete evaluation includes B-mode imaging, spectral Doppler, color Doppler, and power Doppler as needed of all accessible portions of each vessel. Bilateral testing is considered an integral part of a  complete examination. Limited examinations for reoccurring indications may be performed as noted.  +---------+---------------+---------+-----------+----------+--------------+  RIGHT     Compressibility Phasicity Spontaneity Properties Thrombus Aging  +---------+---------------+---------+-----------+----------+--------------+  CFV       Full            Yes       Yes                                    +---------+---------------+---------+-----------+----------+--------------+  SFJ       Full                                                             +---------+---------------+---------+-----------+----------+--------------+  FV Prox   Full                                                             +---------+---------------+---------+-----------+----------+--------------+  FV Mid    Full                                                             +---------+---------------+---------+-----------+----------+--------------+  FV Distal Full                                                             +---------+---------------+---------+-----------+----------+--------------+  PFV       Full                                                             +---------+---------------+---------+-----------+----------+--------------+  POP       Full            Yes       Yes                                    +---------+---------------+---------+-----------+----------+--------------+  PTV       Full                                                             +---------+---------------+---------+-----------+----------+--------------+  PERO      Full                                                             +---------+---------------+---------+-----------+----------+--------------+   +---------+---------------+---------+-----------+----------+--------------+  LEFT      Compressibility Phasicity Spontaneity Properties Thrombus Aging  +---------+---------------+---------+-----------+----------+--------------+  CFV       Full             Yes       Yes                                    +---------+---------------+---------+-----------+----------+--------------+  SFJ       Full                                                             +---------+---------------+---------+-----------+----------+--------------+  FV Prox   Full                                                             +---------+---------------+---------+-----------+----------+--------------+  FV Mid    Full                                                             +---------+---------------+---------+-----------+----------+--------------+  FV Distal Full                                                             +---------+---------------+---------+-----------+----------+--------------+  PFV       Full                                                             +---------+---------------+---------+-----------+----------+--------------+  POP       Full            Yes       Yes                                    +---------+---------------+---------+-----------+----------+--------------+  PTV       Full                                                             +---------+---------------+---------+-----------+----------+--------------+  PERO      Full                                                             +---------+---------------+---------+-----------+----------+--------------+     Summary: Right: There is no evidence of deep vein thrombosis in the lower extremity. No cystic structure found in the popliteal fossa. Left: There is no evidence of deep vein thrombosis in the lower extremity. No cystic structure found in the popliteal fossa.  *See table(s) above for measurements and observations. Electronically signed by Deitra Mayo MD on 05/18/2019 at 3:32:05 PM.    Final     Lab Data:  CBC: Recent Labs  Lab 05/06/2019 0159 05/16/19 0901 05/17/19 0242 05/18/19 0224  WBC 23.9* 20.1* 15.4* 15.7*  NEUTROABS 19.1*  --   --   --   HGB 15.8 14.3 12.9* 13.6  HCT  48.9 45.3 40.7 41.6  MCV 92.8 95.0 96.2 93.1  PLT 98* 114* 121* 267*   Basic Metabolic Panel: Recent Labs  Lab 05/13/2019 0159 05/16/19 0901 05/17/19 0242 05/18/19 0224  NA 142 146* 146* 146*  K 4.6 5.9* 3.8 4.1  CL 102 109 114* 111  CO2 24 22 21* 23  GLUCOSE 103* 66* 109* 115*  BUN 40* 29* 25* 25*  CREATININE 1.00 0.99 0.80 0.80  CALCIUM 10.3 9.2 8.8* 9.2   GFR: Estimated Creatinine Clearance: 105.7 mL/min (by C-G formula based on SCr of 0.8 mg/dL). Liver Function Tests: Recent Labs  Lab 05/20/2019 0159 05/16/19 0901  AST 19 35  ALT 22 25  ALKPHOS 110 102  BILITOT 1.7* 3.1*  PROT 7.6 6.5  ALBUMIN 3.3* 2.7*   No results for input(s): LIPASE, AMYLASE in the last 168 hours. No results for input(s): AMMONIA in the last 168 hours. Coagulation Profile: Recent Labs  Lab 06/04/2019 0159  INR 1.3*   Cardiac Enzymes: No results for input(s): CKTOTAL, CKMB, CKMBINDEX, TROPONINI in the last 168 hours. BNP (last 3 results) No results for input(s): PROBNP in the last 8760 hours. HbA1C: No results for input(s): HGBA1C in the last 72 hours. CBG: No  results for input(s): GLUCAP in the last 168 hours. Lipid Profile: No results for input(s): CHOL, HDL, LDLCALC, TRIG, CHOLHDL, LDLDIRECT in the last 72 hours. Thyroid Function Tests: No results for input(s): TSH, T4TOTAL, FREET4, T3FREE, THYROIDAB in the last 72 hours. Anemia Panel: No results for input(s): VITAMINB12, FOLATE, FERRITIN, TIBC, IRON, RETICCTPCT in the last 72 hours. Urine analysis:    Component Value Date/Time   COLORURINE YELLOW 05/29/2019 0141   APPEARANCEUR CLEAR 05/14/2019 0141   LABSPEC 1.044 (H) 05/11/2019 0141   PHURINE 5.0 05/24/2019 0141   GLUCOSEU NEGATIVE 05/16/2019 0141   HGBUR MODERATE (A) 05/14/2019 0141   BILIRUBINUR NEGATIVE 05/14/2019 0141   KETONESUR NEGATIVE 05/21/2019 0141   PROTEINUR NEGATIVE 05/18/2019 0141   NITRITE NEGATIVE 06/02/2019 0141   LEUKOCYTESUR NEGATIVE 05/30/2019 0141      Almendra Loria M.D. [facility] 05/18/2019, 3:40 PM  Pager: [phone] Between 7am to 7pm - call Pager - [phone]  After 7pm go to [email or web address] - password [email or web address]  Call night coverage person covering after 7pm

## 2019-05-18 NOTE — Progress Notes (Signed)
  Speech Language Pathology Treatment: Dysphagia  Patient Details Name: Justin Randall MRN: [id number] DOB: 1971/06/02 Today's Date: 05/18/2019 Time: 0141-0301 SLP Time Calculation (min) (ACUTE ONLY): 12 min  Assessment / Plan / Recommendation Clinical Impression  Upon return to pt's room to post swallow precaution signs, he advised he definitively desired to try solids. SLP did not test solids during prior session as pt reported increased difficulty with solids prior to admit.  Agreed to allow pt to try solids to assess his tolerance clinically.  No overt indication of aspiration with small amount of solids and slow mastication. Pt desired thin water to help orally transit but SLP advised against - provided thin water via tsp post-swallow.  Suspect pt has been having some underlying dysphagia even prior to one week ago.  Will plan MBS - possibly tomorrow am when his respiratory status is improved. Pt agreeable to plan and diet orders updated.  Using teach back and written precautions, pt educated.      HPI HPI: pt is a 48 yo male with h/o stage IV lung cancer. He has been undergoing palliative chemoradiation admitted with PE.  Pt was transferred from floor to ICU due to respiratory difficulties.  Pt on NRB 5 Liters.  Pt PMH also + for marijuana and tobacco use and emphysema. CXR showed bilateral lung base opacity, particularly right LL c/w aspiration/infection.  RUL masslike opacity unchanged.  Concern for aspiration present.  RN reports pt is difficult to understand.      SLP Plan  Continue with current plan of care;MBS       Recommendations  Diet recommendations: Dysphagia 3 (mechanical soft);Thin liquid;Nectar-thick liquid Liquids provided via: Teaspoon(water via cup) Medication Administration: Via alternative means Supervision: Staff to assist with self feeding;Full supervision/cueing for compensatory strategies Compensations: Slow rate;Small sips/bites Postural Changes and/or Swallow  Maneuvers: Seated upright 90 degrees;Upright 30-60 min after meal                Oral Care Recommendations: Oral care QID Follow up Recommendations: (tbd) SLP Visit Diagnosis: Dysphagia, pharyngeal phase (R13.13) Plan: Continue with current plan of care;MBS       GO                Macario Golds 05/18/2019, 12:13 PM  Luanna Salk, [facility] SLP Acute Rehab Services Pager [phone] Office [phone]

## 2019-05-18 NOTE — Evaluation (Addendum)
Clinical/Bedside Swallow Evaluation Patient Details  Name: Justin Randall MRN: [id number] Date of Birth: 10-09-1970  Today's Date: 05/18/2019 Time: SLP Start Time (ACUTE ONLY): 11 SLP Stop Time (ACUTE ONLY): 1110 SLP Time Calculation (min) (ACUTE ONLY): 30 min  Past Medical History:  Past Medical History:  Diagnosis Date  . Anxiety   . Cancer ([place])   . Sciatica   . Ulcer of the stomach and intestine    Past Surgical History:  Past Surgical History:  Procedure Laterality Date  . IR US GUIDE BX ASP/DRAIN  04/08/2019  . NO PAST SURGERIES     HPI:  pt is a 48 yo male with h/o stage IV lung cancer. He has been undergoing palliative chemo radiation and was admitted with PE.  Pt was transferred from floor to ICU due to respiratory difficulties.  Pt on NRB 5 Liters.  Pt PMH also + for marijuana and tobacco use and emphysema. CXR showed bilateral lung base opacity, particularly right LL c/w aspiration/infection.  RUL masslike opacity unchanged.  Concern for aspiration present.  RN reports pt is difficult to understand.   Assessment / Plan / Recommendation Clinical Impression  Currently pt demonstrates indication of dysphonia, dysphagia and aspiration - most notably with liquids. He admits to solid more than liquid dysphagia however - admitting poor appetite.  Pt is severely dysphonic - only verbalizing a few words per breath group.   He demonstrates much improved tolerance with frozen Icees - with no indication of aspiration which SLP suspects is due to bolus control.  Chin tuck posture did not prevent indication of aspiration with cup bolus of thin.  Pt admits having dysphagia *coughing with po* for at least a week - and does not appear uncomfortable with this occurrence. Cough is nonproductive to expectoration but productive to allow pt to swallow secretions x1/6 episodes.  Pt verbalizes his desire to continue po even if he is aspirating which may contribute to pna.  He is requiring face mask for  oxygen - and SLP pulls mask down to provide pt with po.  At this time, recommend liquids *frozen* via tsp and water via cup with strict precautions.  Per discussion with pt, he desires aggressive treatment and thus MBS is recommended due to indication of pharyngeal dysphagia to help determine least restrictive diet and possible effective compensation strategies. Marland Kitchen SLP Visit Diagnosis: Dysphagia, pharyngeal phase (R13.13)    Aspiration Risk  Moderate aspiration risk;Risk for inadequate nutrition/hydration    Diet Recommendation Thin liquid;Nectar-thick liquid   Liquid Administration via: Cup;Spoon Medication Administration: Via alternative means Supervision: Staff to assist with self feeding;Full supervision/cueing for compensatory strategies Compensations: Slow rate;Small sips/bites Postural Changes: Seated upright at 90 degrees;Remain upright for at least 30 minutes after po intake    Other  Recommendations Oral Care Recommendations: Oral care QID   Follow up Recommendations (tbd)      Frequency and Duration min 2x/week  2 weeks       Prognosis Prognosis for Safe Diet Advancement: Fair Barriers to Reach Goals: Severity of deficits;Other (Comment) Barriers/Prognosis Comment: medical diagnosis - palliative radiation      Swallow Study   General Date of Onset: 05/18/19 HPI: pt is a 48 yo male with h/o stage IV lung cancer. He has been undergoing palliative radiation and was admitted with PE.  Pt was transferred from floor to ICU due to respiratory difficulties.  Pt on NRB 5 Liters.  Pt PMH also + for marijuana and tobacco use and emphysema. CXR showed  bilateral lung base opacity, particularly right LL c/w aspiration/infection.  RUL masslike opacity unchanged.  Concern for aspiration present.  RN reports pt is difficult to understand. Type of Study: Bedside Swallow Evaluation Diet Prior to this Study: Dysphagia 2 (chopped);Nectar-thick liquids Temperature Spikes Noted: No Respiratory  Status: Non-rebreather History of Recent Intubation: No Behavior/Cognition: Alert;Cooperative;Pleasant mood Oral Cavity Assessment: Within Functional Limits Oral Care Completed by SLP: No Oral Cavity - Dentition: Adequate natural dentition Vision: Functional for self-feeding Self-Feeding Abilities: Needs assist(due to oxygen needs) Patient Positioning: Upright in bed Baseline Vocal Quality: Breathy;Low vocal intensity;Hoarse Volitional Cough: Congested(nonproductive) Volitional Swallow: Able to elicit    Oral/Motor/Sensory Function Overall Oral Motor/Sensory Function: Generalized oral weakness   Ice Chips Ice chips: Within functional limits Presentation: Spoon   Thin Liquid Thin Liquid: Impaired Presentation: Spoon;Cup;Straw Pharyngeal  Phase Impairments: Cough - Immediate;Cough - Delayed    Nectar Thick Nectar Thick Liquid: Impaired Presentation: Spoon Pharyngeal Phase Impairments: Cough - Immediate   Honey Thick Honey Thick Liquid: Not tested   Puree Puree: Not tested Other Comments: pt declined   Solid     Solid: Not tested(RR in high 20s concerning for aspiration risk with solids)      Macario Golds 05/18/2019,12:08 PM Luanna Salk, [place] Pager [phone] Office [phone]

## 2019-05-18 NOTE — Progress Notes (Signed)
Daily Progress Note   Patient Name: Justin Randall       Date: 05/18/2019 DOB: 1970/08/07  Age: 48 y.o. MRN#: [id number] Attending Physician: Mendel Corning, MD Primary Care Physician: [name], MD Admit Date: 05/20/2019  Reason for Consultation/Follow-up: Establishing goals of care  Subjective: Resting in bed, has ongoing pain and non pain symptom management needs, discussed with TRH MD, appreciate rad onc following closely.      Length of Stay: 2  Current Medications: Scheduled Meds:  . Chlorhexidine Gluconate Cloth  6 each Topical Daily  . dexamethasone (DECADRON) injection  8 mg Intravenous Q24H  . enoxaparin (LOVENOX) injection  100 mg Subcutaneous Q24H  . feeding supplement  1 Container Oral BID BM  . feeding supplement (PRO-STAT SUGAR FREE 64)  30 mL Oral BID  . influenza vac split quadrivalent PF  0.5 mL Intramuscular Tomorrow-1000  . mouth rinse  15 mL Mouth Rinse BID  . mirtazapine  15 mg Oral QHS  . senna-docusate  1 tablet Oral BID  . sodium chloride flush  3 mL Intravenous Q12H    Continuous Infusions: . ampicillin-sulbactam (UNASYN) IV      PRN Meds: acetaminophen **OR** acetaminophen, bisacodyl, HYDROmorphone (DILAUDID) injection, LORazepam, metoprolol tartrate, ondansetron **OR** ondansetron (ZOFRAN) IV, phenol, polyethylene glycol, Resource ThickenUp Clear  Physical Exam         General: Appears sleepy but arouses easily, in moderate respiratory distress, on nonrebreather  HEENT: No bruits, no goiter, no JVD Heart: Tachy. No murmur appreciated. Lungs: Decreased air movement, shallow breaths, coarse Abdomen: Soft, nontender, nondistended, positive bowel sounds.  Ext: No significant edema Skin: Warm and dry, cancer-related changes noted near right  clavicle and neck   Vital Signs: BP (!) 130/103   Pulse (!) 113   Temp 97.9 F (36.6 C)   Resp 15   Ht 6\' 2"  (1.88 m)   Wt 66.2 kg   SpO2 97%   BMI 18.75 kg/m  SpO2: SpO2: 97 % O2 Device: O2 Device: NRB O2 Flow Rate: O2 Flow Rate (L/min): 15 L/min  Intake/output summary:   Intake/Output Summary (Last 24 hours) at 05/18/2019 1610 Last data filed at 05/18/2019 0600 Gross per 24 hour  Intake -  Output 775 ml  Net -775 ml   LBM: Last BM Date: 05/14/19 Baseline Weight: Weight:  73.5 kg Most recent weight: Weight: 66.2 kg       Palliative Assessment/Data:      Patient Active Problem List   Diagnosis Date Noted  . Acute respiratory failure with hypoxia ([place]) 05/16/2019  . Pulmonary emboli ([place]) 05/29/2019  . Pressure injury of skin 06/02/2019  . 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 Encounter for antineoplastic immunotherapy 05/03/2019  . Cancer related pain 04/27/2019  . Encounter for antineoplastic chemotherapy 04/15/2019  . Goals of care, counseling/discussion 04/15/2019  . 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 Tobacco dependence 12/05/2017  . Depression 12/05/2017  . 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 Chronic right-sided low back pain with sciatica 09/11/2016    Palliative Care Assessment & Plan   Patient Profile: 48 y.o. male  with past medical history of metastatic stage IV non-small cell lung cancer who follows with Dr. Earlie Randall admitted on 05/31/2019 with right-sided chest pain.  He has had significant pain for the past several weeks and has been mostly bedbound.  This is also prevented him from making it to several appointments.  On arrival, CT angio revealed pulmonary embolism.  He suffered from hypoxic respiratory failure and has transitioned to stepdown.  Palliative consulted for goals of care.  Assessment: Patient Active Problem List   Diagnosis Date Noted  . Acute respiratory failure with hypoxia ([place])  05/16/2019  . Pulmonary emboli ([place]) 05/31/2019  . Pressure injury of skin 05/14/2019  . 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 Encounter for antineoplastic immunotherapy 05/03/2019  . Cancer related pain 04/27/2019  . Encounter for antineoplastic chemotherapy 04/15/2019  . Goals of care, counseling/discussion 04/15/2019  . 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 Tobacco dependence 12/05/2017  . Depression 12/05/2017  . 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 Chronic right-sided low back pain with sciatica 09/11/2016     Recommendations/Plan: - DNR/DNI. - continue current pain and non pain symptom management Will benefit from radiation, if he can tolerate, base don his O2 saturation and oxygen requirements. Rad Onc following.    Code Status:    Code Status Orders  (From admission, onward)         Start     Ordered   05/16/19 0822  Do not attempt resuscitation (DNR)  Continuous    Question Answer Comment  In the event of cardiac or respiratory ARREST Do not call a "code blue"   In the event of cardiac or respiratory ARREST Do not perform Intubation, CPR, defibrillation or ACLS   In the event of cardiac or respiratory ARREST Use medication by any route, position, wound care, and other measures to relive pain and suffering. May use oxygen, suction and manual treatment of airway obstruction as needed for comfort.      05/16/19 4268        Code Status History    Date Active Date Inactive Code Status Order ID Comments User Context   05/08/2019 1055 05/16/2019 0821 Full Code [id number]  Lavina Hamman, MD Inpatient   Advance Care Planning Activity       Prognosis:   Guarded  Discharge Planning:  To Be Determined  Care plan was discussed with [facility] MD Thank you for allowing the Palliative Medicine Team to assist in the care of this patient.   Time In: 9 Time Out: 9.25 Total Time 25 Prolonged Time Billed No      Greater than  50%  of this time was spent counseling  and coordinating care related to the above assessment and plan.  [name], MD [phone] Please contact Palliative Medicine Team phone at [phone] for questions and concerns.

## 2019-05-18 NOTE — Progress Notes (Signed)
[place] for LMWH Indication: pulmonary embolus  Allergies  Allergen Reactions  . Fentanyl Nausea And Vomiting    Pt stopped med on his own -  Not feeling self  . Hydrocodone Other (See Comments)    Stays awake, cant sleep, decreased appetite  . Oxycodone     Nausea, "not myself"    Patient Measurements: Height: 6\' 2"  (188 cm) Weight: 146 lb (66.2 kg) IBW/kg (Calculated) : 82.2 Heparin Dosing Weight:   Vital Signs: Temp: 97.9 F (36.6 C) (10/13 0828) Temp Source: Axillary (10/13 0334) BP: 149/107 (10/13 0800) Pulse Rate: 94 (10/13 0800)  Labs: Recent Labs    05/16/19 0901 05/17/19 0242 05/18/19 0224  HGB 14.3 12.9* 13.6  HCT 45.3 40.7 41.6  PLT 114* 121* 126*  CREATININE 0.99 0.80 0.80    Estimated Creatinine Clearance: 105.7 mL/min (by C-G formula based on SCr of 0.8 mg/dL).   Medical History: Past Medical History:  Diagnosis Date  . Anxiety   . Cancer ([place])   . Sciatica   . Ulcer of the stomach and intestine     Medications:  Infusions:    Assessment: Patient with PE.  No oral anticoagulants noted on med rec.   Baseline coags ordered. 10/10 Transition UFH> LMWH .  05/18/2019 Hgb 13.6 Plts 126 Scr 0.8, CrCl >143mls/min  Goal of Therapy:  Monitor platelets by anticoagulation protocol: Yes   Plan:  continue LMWH 100 mg sq q24  Pharmacy will sign off and follow peripherally  Dolly Rias RPh 05/18/2019, 9:38 AM Pager [phone]

## 2019-05-19 ENCOUNTER — Other Ambulatory Visit: Payer: Self-pay | Admitting: Radiation Therapy

## 2019-05-19 ENCOUNTER — Ambulatory Visit: Payer: Medicare Other

## 2019-05-19 ENCOUNTER — Ambulatory Visit: Payer: Medicare Other | Admitting: Pulmonary Disease

## 2019-05-19 ENCOUNTER — Inpatient Hospital Stay (HOSPITAL_COMMUNITY): Payer: Medicare Other

## 2019-05-19 DIAGNOSIS — R000 Tachycardia, unspecified: Secondary | ICD-10-CM

## 2019-05-19 DIAGNOSIS — J9601 Acute respiratory failure with hypoxia: Secondary | ICD-10-CM

## 2019-05-19 DIAGNOSIS — E440 Moderate protein-calorie malnutrition: Secondary | ICD-10-CM | POA: Insufficient documentation

## 2019-05-19 DIAGNOSIS — C3491 Malignant neoplasm of unspecified part of right bronchus or lung: Secondary | ICD-10-CM | POA: Diagnosis not present

## 2019-05-19 LAB — BASIC METABOLIC PANEL
Anion gap: 13 (ref 5–15)
BUN: 29 mg/dL — ABNORMAL HIGH (ref 6–20)
CO2: 22 mmol/L (ref 22–32)
Calcium: 9.3 mg/dL (ref 8.9–10.3)
Chloride: 110 mmol/L (ref 98–111)
Creatinine, Ser: 0.75 mg/dL (ref 0.61–1.24)
GFR calc Af Amer: >60 mL/min (ref >60)
GFR calc non Af Amer: >60 mL/min (ref >60)
Glucose, Bld: 139 mg/dL — ABNORMAL HIGH (ref 70–99)
Potassium: 3.6 mmol/L (ref 3.5–5.1)
Sodium: 145 mmol/L (ref 135–145)

## 2019-05-19 LAB — CBC
HCT: 41.3 % (ref 39.0–52.0)
Hemoglobin: 13 g/dL (ref 13.0–17.0)
MCH: 30 pg (ref 26.0–34.0)
MCHC: 31.5 g/dL (ref 30.0–36.0)
MCV: 95.4 fL (ref 80.0–100.0)
Platelets: 123 10*3/uL — ABNORMAL LOW (ref 150–400)
RBC: 4.33 MIL/uL (ref 4.22–5.81)
RDW: 15.9 % — ABNORMAL HIGH (ref 11.5–15.5)
WBC: 15.6 10*3/uL — ABNORMAL HIGH (ref 4.0–10.5)
nRBC: 0.2 % (ref 0.0–0.2)

## 2019-05-19 LAB — BLOOD GAS, ARTERIAL
Acid-Base Excess: 3.1 mmol/L — ABNORMAL HIGH (ref 0.0–2.0)
Bicarbonate: 26.1 mmol/L (ref 20.0–28.0)
FIO2: 80
O2 Saturation: 93.5 %
Patient temperature: 98.6
pCO2 arterial: 36.3 mmHg (ref 32.0–48.0)
pH, Arterial: 7.471 — ABNORMAL HIGH (ref 7.350–7.450)
pO2, Arterial: 69.5 mmHg — ABNORMAL LOW (ref 83.0–108.0)

## 2019-05-19 MED ORDER — FUROSEMIDE 10 MG/ML IJ SOLN
40.0000 mg | Freq: Once | INTRAMUSCULAR | Status: AC
  Administered 2019-05-19: 40 mg via INTRAVENOUS
  Filled 2019-05-19: qty 4

## 2019-05-19 MED ORDER — METHYLPREDNISOLONE SODIUM SUCC 125 MG IJ SOLR
60.0000 mg | Freq: Four times a day (QID) | INTRAMUSCULAR | Status: DC
  Administered 2019-05-19 – 2019-05-21 (×8): 60 mg via INTRAVENOUS
  Filled 2019-05-19 (×8): qty 2

## 2019-05-19 MED ORDER — POLYETHYLENE GLYCOL 3350 17 G PO PACK
17.0000 g | PACK | Freq: Two times a day (BID) | ORAL | Status: DC
  Administered 2019-05-19: 17 g via ORAL
  Filled 2019-05-19: qty 1

## 2019-05-19 MED ORDER — ADULT MULTIVITAMIN W/MINERALS CH: 1.0000 | ORAL_TABLET | Freq: Every day | ORAL | Status: DC

## 2019-05-19 MED ORDER — ENSURE ENLIVE PO LIQD: 237.0000 mL | ORAL | Status: DC

## 2019-05-19 NOTE — Progress Notes (Signed)
Initial Nutrition Assessment  DOCUMENTATION CODES:   Non-severe (moderate) malnutrition in context of chronic illness  INTERVENTION:  - continue Boost Breeze BID, each supplement provides 250 kcal and 9 grams of protein. - will order Ensure Enlive once/day, each supplement provides 350 kcal and 20 grams of protein. - will order Magic Cup with dinner meals, each supplement provides 290 kcal and 9 grams of protein. - will d/c prostat per patient request.  - continue to encourage PO intakes as tolerated.    NUTRITION DIAGNOSIS:   Moderate Malnutrition related to cancer and cancer related treatments, chronic illness as evidenced by moderate fat depletion, moderate muscle depletion. -revised  GOAL:   Patient will meet greater than or equal to 90% of their needs -unmet  MONITOR:   PO intake, Supplement acceptance, Labs, Weight trends, Skin  ASSESSMENT:   48 year old male with history of metastatic stage VI NSCLC with R shoulder mets. He presented to the ED with right-sided chest pain ongoing x4 days. Patient reports\ed pain has been progressively worsening and that it was uncontrolled with his home pain medications. He also reported difficulty breathing and occasional dry cough. PTA he had been referred to radiation oncology but was unable to attend appointments due to severe pain and fatigue. He has been most bedbound and unable to ambulate due to pain. He reported decreased appetite and not eating or drinking anything well. CT chest showed pulmonary embolism and patient was admitted for further work-up.  Patient has not been weighed since 10/11. Diet advanced from CLD to Dysphagia 2, nectar-thick liquids on 10/13 at 0804 and to Dysphagia 3, thin liquids ~3.5 hours later. Per flow sheet, he consumed 15% of lunch and 25% of dinner on 10/10 and 50% of dinner on 10/11 (all CLD). Per review of orders, he has been accepting Boost Breeze ~75% of the time offered and only accepted 1 of 6 packets of  prostat offered.   Patient on non-rebreather at the time of RD visit. No family/visitors present. Patient becomes SOB with saying more than a few words. He is sometimes difficult to understand. Patient requested menu and for RD to order lunch. He requested chicken salad sandwich on white bread, mango italian ice, vanilla ice cream, apple juice, and grape juice. Patient is on automatic trays; let RN know and she plans to help patient with dinner order so he receives items he would like to have.   Per notes: - acute respiratory failure with hypoxia--thought to be 2/2 malignancy and partly d/t PE - acute, small, L-sided PE - stage 4 metastatic SCLC--on chemo, not hemodynamically stable enough to receive radiation currently - PCM - generalized weakness and FTT - guarded prognosis   Labs reviewed; BUN: 29 mg/dl. Medications reviewed; 40 mg IV lasix x1 dose 10/14, 1 packet miralax BID, 1 tablet senokot BID.     NUTRITION - FOCUSED PHYSICAL EXAM:    Most Recent Value  Orbital Region  Moderate depletion  Upper Arm Region  Moderate depletion  Thoracic and Lumbar Region  Unable to assess  Buccal Region  Moderate depletion  Temple Region  Moderate depletion  Clavicle Bone Region  Moderate depletion  Clavicle and Acromion Bone Region  Moderate depletion  Scapular Bone Region  Unable to assess  Dorsal Hand  Moderate depletion  Patellar Region  Unable to assess  Anterior Thigh Region  Unable to assess  Posterior Calf Region  Unable to assess  Edema (RD Assessment)  Unable to assess  Hair  Reviewed  Eyes  Reviewed  Mouth  Reviewed  Skin  Reviewed  Nails  Reviewed       Diet Order:   Diet Order            DIET DYS 3 Room service appropriate? No; Fluid consistency: Thin  Diet effective now              EDUCATION NEEDS:   No education needs have been identified at this time  Skin:  Skin Assessment: Skin Integrity Issues: Skin Integrity Issues:: Stage I, Other (Comment) Stage  I: L hip Other: radiation burn to R shoulder  Last BM:  10/9  Height:   Ht Readings from Last 1 Encounters:  05/16/19 6\' 2"  (1.88 m)    Weight:   Wt Readings from Last 1 Encounters:  05/16/19 66.2 kg    Ideal Body Weight:  86.4 kg  BMI:  Body mass index is 18.75 kg/m.  Estimated Nutritional Needs:   Kcal:  7425-5258 kcal (35-40 kcal/kg)  Protein:  130-140 grams  Fluid:  >/= 2.3 L/day     Jarome Matin, MS, RD, LDN, [facility] Inpatient Clinical Dietitian Pager # [phone] After hours/weekend pager # [phone]

## 2019-05-19 NOTE — Progress Notes (Addendum)
  Speech Language Pathology Treatment: Dysphagia  Patient Details Name: Justin Randall MRN: [id number] DOB: 1971/06/30 Today's Date: 05/19/2019 Time: 5462-7035 SLP Time Calculation (min) (ACUTE ONLY): 27 min  Assessment / Plan / Recommendation Clinical Impression  Today pt with increased dyspnea - RR up to 30's and Oxygen saturation decreased to mid 80s to low 90s with oxygen via NRB.  Per RN, ABGs were not terrible.   Pt unable to verbalize today adequately due to his gross weakness and dyspnea.  Despite respiratory status, pt continues to want minimal intake.  Provided him with small amount of frozen popsicle x2 without overt indication of aspiration.  Pt advised NOT to consume any solids or anything beyond tsps frozen liquid as tolerated with goal of comfort and hydration - not nutrition today. He is strictly advised to STOP intake if he is coughing immediately post-swallow.  Note he is a DNR and palliative is following. Nutritional concerns present given his level of dysphagia/dyspnea.      Will plan MBS as respiratory status improves as pt clearly indicates one week h/o dysphagia, cough associated with po and dysphonia raising concerns.  Will not conduct MBS today due to pt's tenuous respiratory status and current diet recommendations.  RN and pt agreeable to plan.  SLP secure chatted MD with recommendations of npo x frozen tsp boluses, tsps water and medication via IV.     HPI HPI: pt is a 48 yo male with h/o stage IV lung cancer. He has been undergoing palliative chemoradiation admitted with PE.  Pt was transferred from floor to ICU due to respiratory difficulties.  Pt on NRB 5 Liters.  Pt PMH also + for marijuana and tobacco use and emphysema. CXR showed bilateral lung base opacity, particularly right LL c/w aspiration/infection.  RUL masslike opacity unchanged.  Concern for aspiration present.  RN reports pt is difficult to understand.      SLP Plan  Continue with current plan of care;MBS(MBS  when pt is medically able to tolerate)       Recommendations  Diet recommendations: Thin liquid(thin water via tsp, all other liquids frozen - eg popsicle, etc via tsp only) Liquids provided via: Teaspoon(water via cup) Medication Administration: Via alternative means Supervision: Staff to assist with self feeding;Full supervision/cueing for compensatory strategies Compensations: Slow rate;Small sips/bites Postural Changes and/or Swallow Maneuvers: Seated upright 90 degrees;Upright 30-60 min after meal                Oral Care Recommendations: Oral care prior to ice chip/H20 Follow up Recommendations: (tbd) SLP Visit Diagnosis: Dysphagia, pharyngeal phase (R13.13) Plan: Continue with current plan of care;MBS(MBS when pt is medically able to tolerate)       GO                Macario Golds 05/19/2019, 12:43 PM   Luanna Salk, [facility] SLP [place] Pager [phone] Office [phone]

## 2019-05-19 NOTE — Consult Note (Addendum)
Name: Justin Randall MRN: [id number] DOB: 02-Nov-1970    ADMISSION DATE:  05/14/2019 CONSULTATION DATE:  05/19/2019   REFERRING MD :  Clementeen Graham, MD  CHIEF COMPLAINT: Respiratory distress and hypoxia  BRIEF PATIENT DESCRIPTION: 48 year old man with metastatic non-small cell lung cancer and acute PE with acute hypoxic respiratory failure  SIGNIFICANT EVENTS    STUDIES:  CT angios chest 10/10 >> 1. Small pulmonary emboli in the LEFT lower lobe and LEFT upper lobe. Overall clot burden mild. 2. Stable RIGHT upper lobe mass. 3. Bulky RIGHT axillary and supraclavicular adenopathy. 4. Interval increase in size of RIGHT supraclavicular nodal mass. 5. Small pericardial effusion increased   HISTORY OF PRESENT ILLNESS: 48 year old smoker was diagnosed in 04/2019 with stage IV metastatic adenocarcinoma when he presented with right upper lobe lung mass, bilateral mediastinal, supraclavicular axillary and cervical lymphadenopathy as well as left chest wall invasion.  PDL 1 expression was 100%.  He was started on immunotherapy with Keytruda, first dose 10/5.  He received first cycle of carboplatin and paclitaxel on 9/28 Plan was for palliative radiation to left chest wall mass He was admitted 10/10 with right-sided chest pain and hypoxia initially required 4 L oxygen but rapidly progressed to nonrebreather, he has been hypoxic to this level for the last 2 days.  He was placed on therapeutic Lovenox after finding of pulmonary embolism on CT. We are consulted today due to worsening hypoxia and respiratory distress with O2 saturations 87% on nonrebreather.  He was given 40 mg of Lasix and has diuresed 1.4 L Was also started on Unasyn for aspiration pneumonia I have also reviewed palliative consultation .   PAST MEDICAL HISTORY :   has a past medical history of Anxiety, Cancer ([place]), Sciatica, and Ulcer of the stomach and intestine.  has a past surgical history that includes No past surgeries and IR US  Guide Bx Asp/Drain (04/08/2019). Prior to Admission medications   Medication Sig Start Date End Date Taking? Authorizing Provider  morphine (MS CONTIN) 30 MG 12 hr tablet Take 1 tablet (30 mg total) by mouth every 12 (twelve) hours. 05/03/19  Yes Curt Bears, MD  morphine (MSIR) 15 MG tablet Take 1 tablet (15 mg total) by mouth every 6 (six) hours as needed for severe pain. 05/03/19  Yes Curt Bears, MD  fentaNYL (DURAGESIC) 25 MCG/HR Place 1 patch onto the skin every 3 (three) days. Rotate sites every application Patient not taking: Reported on 05/03/2019 05/02/19   Lauraine Rinne, NP  DULoxetine (CYMBALTA) 30 MG capsule Take 1 capsule (30 mg total) by mouth daily. 12/05/17 03/22/19  [name], MD   Allergies  Allergen Reactions   Fentanyl Nausea And Vomiting    Pt stopped med on his own -  Not feeling self   Hydrocodone Other (See Comments)    Stays awake, cant sleep, decreased appetite   Oxycodone     Nausea, "not myself"    FAMILY HISTORY:  family history includes Healthy in his mother. SOCIAL HISTORY:  reports that he has been smoking. He has a 30.00 pack-year smoking history. He has never used smokeless tobacco. He reports current drug use. Drug: Marijuana. He reports that he does not drink alcohol.  REVIEW OF SYSTEMS:   Constitutional: Negative for fever, chills and diaphoresis. Positive for weight loss and fatigue HENT: Negative for hearing loss, ear pain, nosebleeds, congestion, sore throat, neck pain, tinnitus and ear discharge.   Eyes: Negative for blurred vision, double vision, photophobia, pain, discharge and  redness.  Respiratory: Negative for wheezing and stridor.   Cardiovascular: Negative for  palpitations, orthopnea, claudication, leg swelling and PND.  Gastrointestinal: Negative for heartburn, nausea, vomiting, abdominal pain, diarrhea, constipation, blood in stool and melena.  Genitourinary: Negative for dysuria, urgency, frequency, hematuria and flank  pain.  Musculoskeletal: Negative for myalgias, back pain, joint pain and falls.  Skin: Negative for itching and rash.  Neurological: Negative for dizziness, tingling, tremors, sensory change, speech change, focal weakness, seizures, loss of consciousness, weakness and headaches.  Endo/Heme/Allergies: Negative for environmental allergies and polydipsia. Does not bruise/bleed easily.  SUBJECTIVE:   VITAL SIGNS: Temp:  [96.6 F (35.9 C)-98.7 F (37.1 C)] 98.7 F (37.1 C) (10/14 0800) Pulse Rate:  [57-119] 119 (10/14 1200) Resp:  [14-36] 23 (10/14 1200) BP: (127-155)/(95-122) 146/104 (10/14 1200) SpO2:  [84 %-97 %] 84 % (10/14 1200)  PHYSICAL EXAMINATION: Gen. Pleasant, well-nourished, in mild distress, depressed affect ENT -mild pallor, no icterus Neck: No JVD, no thyromegaly, no carotid bruits , right supraclavicular lymphadenopathy, ulcerative Lungs: mild use of accessory muscles, no dullness to percussion, decreased with right basal rales no rhonchi  Cardiovascular: Rhythm regular, heart sounds  normal, no murmurs or gallops, no peripheral edema Abdomen: soft and non-tender, no hepatosplenomegaly, BS normal. Musculoskeletal: No deformities, no cyanosis , clubbing 1+ Neuro:  alert, non focal   Chest x-ray 10/14 was personally reviewed and this shows mild interstitial infiltrate bibasal and especially on the left  Recent Labs  Lab 05/17/19 0242 05/18/19 0224 05/19/19 0210  NA 146* 146* 145  K 3.8 4.1 3.6  CL 114* 111 110  CO2 21* 23 22  BUN 25* 25* 29*  CREATININE 0.80 0.80 0.75  GLUCOSE 109* 115* 139*   Recent Labs  Lab 05/17/19 0242 05/18/19 0224 05/19/19 0210  HGB 12.9* 13.6 13.0  HCT 40.7 41.6 41.3  WBC 15.4* 15.7* 15.6*  PLT 121* 126* 123*   Dg Chest Port 1 View  Result Date: 05/18/2019 CLINICAL DATA:  Dyspnea EXAM: PORTABLE CHEST 1 VIEW COMPARISON:  05/27/2019 FINDINGS: There is new and increased heterogeneous airspace opacity of the bilateral lung bases,  particularly the right lung base. There is an unchanged, masslike opacity of the right upper lobe. The heart and mediastinum are unremarkable. IMPRESSION: 1. There is new and increased heterogeneous airspace opacity of the bilateral lung bases, particularly the right lung base, consistent with infection or aspiration. 2. Unchanged right upper lobe masslike opacity. Electronically Signed   By: Eddie Candle M.D.   On: 05/18/2019 09:05   Vas [place] Lower Extremity Venous (dvt)  Result Date: 05/18/2019  Lower Venous Study Indications: Edema.  Risk Factors: None identified. Comparison Study: No prior studies. Performing Technologist: Oliver Hum RVT  Examination Guidelines: A complete evaluation includes B-mode imaging, spectral Doppler, color Doppler, and power Doppler as needed of all accessible portions of each vessel. Bilateral testing is considered an integral part of a complete examination. Limited examinations for reoccurring indications may be performed as noted.  +---------+---------------+---------+-----------+----------+--------------+  RIGHT     Compressibility Phasicity Spontaneity Properties Thrombus Aging  +---------+---------------+---------+-----------+----------+--------------+  CFV       Full            Yes       Yes                                    +---------+---------------+---------+-----------+----------+--------------+  SFJ  Full                                                             +---------+---------------+---------+-----------+----------+--------------+  FV Prox   Full                                                             +---------+---------------+---------+-----------+----------+--------------+  FV Mid    Full                                                             +---------+---------------+---------+-----------+----------+--------------+  FV Distal Full                                                              +---------+---------------+---------+-----------+----------+--------------+  PFV       Full                                                             +---------+---------------+---------+-----------+----------+--------------+  POP       Full            Yes       Yes                                    +---------+---------------+---------+-----------+----------+--------------+  PTV       Full                                                             +---------+---------------+---------+-----------+----------+--------------+  PERO      Full                                                             +---------+---------------+---------+-----------+----------+--------------+   +---------+---------------+---------+-----------+----------+--------------+  LEFT      Compressibility Phasicity Spontaneity Properties Thrombus Aging  +---------+---------------+---------+-----------+----------+--------------+  CFV       Full            Yes       Yes                                    +---------+---------------+---------+-----------+----------+--------------+  SFJ       Full                                                             +---------+---------------+---------+-----------+----------+--------------+  FV Prox   Full                                                             +---------+---------------+---------+-----------+----------+--------------+  FV Mid    Full                                                             +---------+---------------+---------+-----------+----------+--------------+  FV Distal Full                                                             +---------+---------------+---------+-----------+----------+--------------+  PFV       Full                                                             +---------+---------------+---------+-----------+----------+--------------+  POP       Full            Yes       Yes                                     +---------+---------------+---------+-----------+----------+--------------+  PTV       Full                                                             +---------+---------------+---------+-----------+----------+--------------+  PERO      Full                                                             +---------+---------------+---------+-----------+----------+--------------+     Summary: Right: There is no evidence of deep vein thrombosis in the lower extremity. No cystic structure found in the popliteal fossa. Left: There is no evidence of deep vein thrombosis in the lower extremity. No cystic structure found in the popliteal fossa.  *See table(s) above for measurements and observations. Electronically signed  by Deitra Mayo MD on 05/18/2019 at 3:32:05 PM.    Final     ASSESSMENT / PLAN:  Acute hypoxic respiratory failure Metastatic lung cancer Acute pulmonary embolism -low clot burden, no RV strain Underlying bullous emphysema  Discussion-hypoxia is quite out of proportion to clot burden so cannot attribute this entirely to PE.  He was diuresed 1.4 L with 40 mg of Lasix.  He does not have bronchospasm.  He now has bibasilar infiltrates and is being treated for aspiration Differential diagnosis here would be Keytruda pneumonitis (checkpoint inhibitor adverse effect)  Recommend -start Solu-Medrol 60 every 6 instead of dexamethasone He prefers nonrebreather currently but heated high flow would be an alternative If hypoxia worsens, then can use BiPAP transiently  -DNR/DNI noted and if hypoxia worsens would consider escalating level of palliation and adding Roxanol for dyspnea relief -Continue therapeutic dosing of Lovenox  Kara Mead MD. FCCP. [facility]  If no response to pager , please call [phone]     05/19/2019, 1:31 PM

## 2019-05-19 NOTE — Progress Notes (Signed)
OT Cancellation Note  Patient Details Name: Justin Randall MRN: [id number] DOB: [birth year]   Cancelled Treatment:    Reason Eval/Treat Not Completed: Medical issues which prohibited therapy.  Dyspnea/increased WOB. Will check back another day.  [place] 05/19/2019, 1:53 PM  Lesle Chris, OTR/L [facility] [phone] [facility] pager [phone] office 05/19/2019

## 2019-05-19 NOTE — Progress Notes (Signed)
RT contacted lab at approximately 0911 regarding ABG sample being sent for analysis.

## 2019-05-19 NOTE — Progress Notes (Addendum)
HEMATOLOGY-ONCOLOGY PROGRESS NOTE  SUBJECTIVE: Remains very short of breath this morning.  Book with nursing at hospitalist who reported the patient had O2 sats down in the mid 80s despite being on nonrebreather.  He received a dose of IV Lasix.  Nursing reports that he has voided at least 700 cc of urine far.  O2 sats 89 to 90% on the nonrebreather at the time of my visit.  Has been started on Unasyn secondary to possible aspiration pneumonia.  Remains afebrile.  Pain is currently controlled.  Oncology History  Adenocarcinoma, lung, right ([place])  04/13/2019 Initial Diagnosis   Adenocarcinoma, lung, right ([place])   05/03/2019 - 05/03/2019 Chemotherapy   The patient had palonosetron (ALOXI) injection 0.25 mg, 0.25 mg, Intravenous,  Once, 0 of 7 cycles CARBOplatin (PARAPLATIN) 260 mg in sodium chloride 0.9 % 100 mL chemo infusion, 260 mg (100 % of original dose 256.6 mg), Intravenous,  Once, 0 of 7 cycles Dose modification: 256.6 mg (original dose 256.6 mg, Cycle 1) PACLitaxel (TAXOL) 90 mg in sodium chloride 0.9 % 250 mL chemo infusion (</= 21m/m2), 45 mg/m2 = 90 mg, Intravenous,  Once, 0 of 7 cycles  for chemotherapy treatment.    05/10/2019 -  Chemotherapy   The patient had pembrolizumab (KEYTRUDA) 200 mg in sodium chloride 0.9 % 50 mL chemo infusion, 200 mg, Intravenous, Once, 0 of 8 cycles  for chemotherapy treatment.       REVIEW OF SYSTEMS:   Constitutional: Denies fevers and chills Respiratory: Continues to report being very short of breath Cardiovascular: Denies palpitation, chest discomfort Gastrointestinal:  Denies nausea, heartburn or change in bowel habits Skin: Denies abnormal skin rashes Lymphatics: Denies new lymphadenopathy or easy bruising Neurological:Denies numbness, tingling or new weaknesses Behavioral/Psych: Mood is stable, no new changes  Extremities: No lower extremity edema All other systems were reviewed with the patient and are negative.  I have reviewed the past  medical history, past surgical history, social history and family history with the patient and they are unchanged from previous note.   PHYSICAL EXAMINATION: ECOG PERFORMANCE STATUS: 3 - Symptomatic, >50% confined to bed  Vitals:   05/19/19 0801 05/19/19 0900  BP: (!) 155/105 (!) 142/97  Pulse: (!) 117 (!) 118  Resp: (!) 24 (!) 23  Temp:    SpO2: 92% 94%   Filed Weights   05/29/2019 0018 05/21/2019 1051 05/16/19 1120  Weight: 162 lb (73.5 kg) 141 lb (64 kg) 146 lb (66.2 kg)    Intake/Output from previous day: 10/13 0701 - 10/14 0700 In: 302.9 [I.V.:3; IV Piggyback:299.9] Out: 1025 [Urine:1025]  GENERAL:alert, appears short of breath LUNGS: Coarse breath sounds in the anterior lung fields HEART: Tachycardic, no LE edema ABDOMEN:abdomen soft, non-tender and normal bowel sounds Musculoskeletal:no cyanosis of digits and no clubbing  NEURO: alert & oriented x 3 with fluent speech, no focal motor/sensory deficits  LABORATORY DATA:  I have reviewed the data as listed CMP Latest Ref Rng & Units 05/19/2019 05/18/2019 05/17/2019  Glucose 70 - 99 mg/dL 139(H) 115(H) 109(H)  BUN 6 - 20 mg/dL 29(H) 25(H) 25(H)  Creatinine 0.61 - 1.24 mg/dL 0.75 0.80 0.80  Sodium 135 - 145 mmol/L 145 146(H) 146(H)  Potassium 3.5 - 5.1 mmol/L 3.6 4.1 3.8  Chloride 98 - 111 mmol/L 110 111 114(H)  CO2 22 - 32 mmol/L 22 23 21(L)  Calcium 8.9 - 10.3 mg/dL 9.3 9.2 8.8(L)  Total Protein 6.5 - 8.1 g/dL - - -  Total Bilirubin 0.3 - 1.2 mg/dL - - -  Alkaline Phos 38 - 126 U/L - - -  AST 15 - 41 U/L - - -  ALT 0 - 44 U/L - - -    Lab Results  Component Value Date   WBC 15.6 (H) 05/19/2019   HGB 13.0 05/19/2019   HCT 41.3 05/19/2019   MCV 95.4 05/19/2019   PLT 123 (L) 05/19/2019   NEUTROABS 19.1 (H) 05/21/2019    Ct Angio Chest Pe W And/or Wo Contrast  Result Date: 05/16/2019 CLINICAL DATA:  Per EMS, Pt called EMS for right 9/10 flank pain. Steadily increasing pain for the last 4 days. Pt has stage 4  lung cancer. Last radiation treatment was 05/11/2019. Pt has prescription for morphine, last took yesterday. Omni 350/.*comment was truncated*^145m OMNIPAQUE IOHEXOL 350 MG/ML SOLNPE suspected, high pretest prob EXAM: CT ANGIOGRAPHY CHEST WITH CONTRAST TECHNIQUE: Multidetector CT imaging of the chest was performed using the standard protocol during bolus administration of intravenous contrast. Multiplanar CT image reconstructions and MIPs were obtained to evaluate the vascular anatomy. CONTRAST:  101mOMNIPAQUE IOHEXOL 350 MG/ML SOLN COMPARISON:  04/20/2019, PET-CT FINDINGS: Cardiovascular: A tubular filling defect in the LEFT lower lobe pulmonary artery (image 57/4 consistent with thromboembolus. This is partially occlusive. Small embolus in the LEFT upper lobe pulmonary artery is partially wall adherent (image 37/4). No RIGHT ventricular strain evident. Mediastinum/Nodes: Bulky RIGHT axillary and RIGHT septic clavicular adenopathy again demonstrated. There is a mass of nodal tissue in the RIGHT supraclavicular region measuring 6.5 by 7.3 cm. This intense hypermetabolic on comparison PET scan. The nodal mass appears increased from roughly 5.4 x 5.4 cm. In the anterior mediastinum large prevascular lymph node measuring 3 cm similar. Small pericardial effusions increased. Lungs/Pleura: RIGHT upper lobe irregular mass measuring 3.6 cm not changed comparison exam. No pneumothorax. Pneumonia. Small focus of ground-glass density in the lateral aspect of the RIGHT middle lobe. Paraseptal emphysema the upper lobes. Upper Abdomen: Limited view of the liver, kidneys, pancreas are unremarkable. Normal adrenal glands. Musculoskeletal: No clear aggressive osseous lesion. Review of the MIP images confirms the above findings. IMPRESSION: 1. Small pulmonary emboli in the LEFT lower lobe and LEFT upper lobe. Overall clot burden mild. 2. Stable RIGHT upper lobe mass. 3. Bulky RIGHT axillary and supraclavicular adenopathy. 4.  Interval increase in size of RIGHT supraclavicular nodal mass. 5. Small pericardial effusion increased. Aortic Atherosclerosis (ICD10-I70.0) and Emphysema (ICD10-J43.9). [facility]  at[facility]:51. Electronically Signed   By: StSuzy Bouchard.D.   On: 05/30/2019 04:51   Nm Pet Image Initial (pi) Skull Base To Thigh  Result Date: 04/20/2019 CLINICAL DATA:  Initial treatment strategy for metastatic lung cancer. EXAM: NUCLEAR MEDICINE PET SKULL BASE TO THIGH TECHNIQUE: 8.74 mCi F-18 FDG was injected intravenously. Full-ring PET imaging was performed from the skull base to thigh after the radiotracer. CT data was obtained and used for attenuation correction and anatomic localization. Fasting blood glucose: 99 mg/dl COMPARISON:  CT chest, abdomen and pelvis 03/30/2019 FINDINGS: Mediastinal blood pool activity: SUV max 2.95 Liver activity: SUV max NA NECK: Extensive FDG avid adenopathy is identified within right cervical lymph node chain. Hypermetabolic lymph nodes are identified within the right level 2, 3, 4 and 5 stations. Index right level 2 node measures 1.1 cm within SUV max of 18.47. Index right level 3/4 lymph node measures 2.2 cm short axis and has an SUV max of 20.12. Incidental CT findings: none CHEST: Extensive right supraclavicular hypermetabolic adenopathy identified. Index right supraclavicular lymph node measures  2.9 cm with SUV max of 18.58. Hypermetabolic right retropectoral and right axillary lymph nodes identified. Right axillary node measures 1.9 cm short axis and has an SUV max of 26.4. Multiple left supraclavicular hypermetabolic lymph nodes are identified. Index lymph node measures 1.3 cm with SUV max of 12.59. Hypermetabolic bilateral mediastinal, subcarinal, and posterior mediastinal adenopathy identified: Right pre-vascular node measures 2.8 cm and has an SUV max of 14.1. Index left pre-vascular lymph node measures 1.5 cm with SUV max of 17.02.  Subcarinal lymph node measures 1.2 cm within SUV max of 12.14. Posterior mediastinal lymph node between the aorta and esophagus adjacent to the left mainstem bronchus measures 2.3 cm and has an SUV max of 15.35. Right apical lung mass with central cavitation measures 4 cm and has an SUV max of 16.38. Incidental CT findings: Advanced bullous emphysema. Small pericardial effusion. Mild aortic atherosclerosis. ABDOMEN/PELVIS: No abnormal hypermetabolic activity within the liver, pancreas, adrenal glands, or spleen. No hypermetabolic lymph nodes in the abdomen or pelvis. Incidental CT findings: Aortic atherosclerosis. SKELETON: 2 foci of chest wall involvement are identified involving the upper medial aspect of the right pectoralis musculature with SUV max of 11.7. This appears to be a direct extension from right supraclavicular tumor. On the left, there is asymmetric increased uptake along the left internal mammary lymph node chain with extension into the second left costosternal junction. No findings to suggest distant osseous metastatic disease within the axial or proximal appendicular skeleton. Incidental CT findings: none IMPRESSION: 1. Hypermetabolic right upper lobe lung mass is again noted compatible with primary bronchogenic carcinoma. 2. Bilateral mediastinal, bilateral supraclavicular, right subpectoral and right axillary, and right level 2, 3, 4, and 5 cervical hypermetabolic nodal metastasis. 3. Evidence of right upper chest wall involvement by tumor which is felt to be a direct extension from bulky right supraclavicular adenopathy. 4. Left chest wall involvement is also suspected at the left second costosternal junction. This may be the result of direct extension from left internal mammary nodal metastasis. Electronically Signed   By: Kerby Moors M.D.   On: 04/20/2019 14:11   Dg Chest Port 1 View  Result Date: 05/18/2019 CLINICAL DATA:  Dyspnea EXAM: PORTABLE CHEST 1 VIEW COMPARISON:  05/16/2019  FINDINGS: There is new and increased heterogeneous airspace opacity of the bilateral lung bases, particularly the right lung base. There is an unchanged, masslike opacity of the right upper lobe. The heart and mediastinum are unremarkable. IMPRESSION: 1. There is new and increased heterogeneous airspace opacity of the bilateral lung bases, particularly the right lung base, consistent with infection or aspiration. 2. Unchanged right upper lobe masslike opacity. Electronically Signed   By: Eddie Candle M.D.   On: 05/18/2019 09:05   Dg Chest Port 1 View  Result Date: 06/03/2019 CLINICAL DATA:  Flank pain. History of lung cancer. EXAM: PORTABLE CHEST 1 VIEW COMPARISON:  03/22/2019 chest radiograph and 03/30/2019 chest CT FINDINGS: Right apical opacities are consistent with previously demonstrated cavitary mass. The lungs are otherwise clear. There is bullous emphysema apices. IMPRESSION: 1. No acute cardiopulmonary disease. 2. Unchanged appearance of the right apical cavitary mass. 3. Emphysema. Electronically Signed   By: Ulyses Jarred M.D.   On: 06/01/2019 02:34   Vas [place] Lower Extremity Venous (dvt)  Result Date: 05/18/2019  Lower Venous Study Indications: Edema.  Risk Factors: None identified. Comparison Study: No prior studies. Performing Technologist: Oliver Hum RVT  Examination Guidelines: A complete evaluation includes B-mode imaging, spectral Doppler, color Doppler, and power Doppler  as needed of all accessible portions of each vessel. Bilateral testing is considered an integral part of a complete examination. Limited examinations for reoccurring indications may be performed as noted.  +---------+---------------+---------+-----------+----------+--------------+ RIGHT    CompressibilityPhasicitySpontaneityPropertiesThrombus Aging +---------+---------------+---------+-----------+----------+--------------+ CFV      Full           Yes      Yes                                  +---------+---------------+---------+-----------+----------+--------------+ SFJ      Full                                                        +---------+---------------+---------+-----------+----------+--------------+ FV Prox  Full                                                        +---------+---------------+---------+-----------+----------+--------------+ FV Mid   Full                                                        +---------+---------------+---------+-----------+----------+--------------+ FV DistalFull                                                        +---------+---------------+---------+-----------+----------+--------------+ PFV      Full                                                        +---------+---------------+---------+-----------+----------+--------------+ POP      Full           Yes      Yes                                 +---------+---------------+---------+-----------+----------+--------------+ PTV      Full                                                        +---------+---------------+---------+-----------+----------+--------------+ PERO     Full                                                        +---------+---------------+---------+-----------+----------+--------------+   +---------+---------------+---------+-----------+----------+--------------+ LEFT     CompressibilityPhasicitySpontaneityPropertiesThrombus Aging +---------+---------------+---------+-----------+----------+--------------+ CFV  Full           Yes      Yes                                 +---------+---------------+---------+-----------+----------+--------------+ SFJ      Full                                                        +---------+---------------+---------+-----------+----------+--------------+ FV Prox  Full                                                         +---------+---------------+---------+-----------+----------+--------------+ FV Mid   Full                                                        +---------+---------------+---------+-----------+----------+--------------+ FV DistalFull                                                        +---------+---------------+---------+-----------+----------+--------------+ PFV      Full                                                        +---------+---------------+---------+-----------+----------+--------------+ POP      Full           Yes      Yes                                 +---------+---------------+---------+-----------+----------+--------------+ PTV      Full                                                        +---------+---------------+---------+-----------+----------+--------------+ PERO     Full                                                        +---------+---------------+---------+-----------+----------+--------------+     Summary: Right: There is no evidence of deep vein thrombosis in the lower extremity. No cystic structure found in the popliteal fossa. Left: There is no evidence of deep vein thrombosis in the lower extremity. No cystic structure found in the popliteal fossa.  *See table(s) above for measurements and observations. Electronically signed by Deitra Mayo MD  on 05/18/2019 at 3:32:05 PM.    Final     ASSESSMENT AND PLAN: 1.  Metastatic non-small cell lung cancer, adenocarcinoma 2.  Acute respiratory failure with hypoxia 3.  Pulmonary emboli 4.  Painful right shoulder metastatic disease  -Continues to have shortness of breath and hypoxia despite being on nonrebreather.  Has received dose of Lasix this morning with improvement of his O2 sats to 89 to 90%.  The patient is currently on steroids and IV antibiotics for aspiration pneumonia.  Respiratory status is not really improving significantly.  May be helpful to request pulmonology  consult for further recommendations. -Pain is currently controlled with current meds.  Radiation oncology is aware of the patient's admission.  We will notify them when the patient is more stable so that they can resume palliative radiation. -Continue Lovenox.   LOS: 3 days   Mikey Bussing, DNP, AGPCNP-BC, AOCNP 05/19/19  ADDENDUM: Hematology/Oncology Attending: I had a face-to-face encounter with the patient today.  I agree with the above note.  I recommended his care plan.  This is a very pleasant 48 years old African-American male with metastatic non-small cell lung cancer, adenocarcinoma with PD-L1 expression of 100%.  The patient was supposed to start treatment with immunotherapy with Keytruda last week but he missed his appointment.  He was also supposed to start palliative radiotherapy to the chest wall invasive disease but also did not show up for his appointment.  He was admitted to the hospital with worsening dyspnea and currently undergoing treatment for aspiration pneumonia.  He had CT angiogram of the chest that showed a small pulmonary embolism in addition to the airspace disease. I had a lengthy discussion with the patient today about his current condition and treatment options.  The patient was seen by the palliative care team and offered palliative care and hospice but he declined hospice since he is trying to receive some treatment in the future if his condition gets better.  He does not mind palliative care. If his respiratory status improves, I would recommend for the patient to proceed with the palliative radiotherapy as well as treatment with Keytruda. If he continues to have worsening of his condition, then palliative care and hospice is very appropriate for the patient. Thank you for taking good care of Mr. Blackburn, I will continue to follow observation with you and assist in his management on as-needed basis.  Disclaimer: This note was dictated with voice recognition software.  Similar sounding words can inadvertently be transcribed and may be missed upon review. Eilleen Kempf, MD

## 2019-05-19 NOTE — Progress Notes (Signed)
PROGRESS NOTE                                                                                                                                                                                                             Patient Demographics:    Justin Randall, is a 48 y.o. male, DOB - [birth year], NAT:[id number]  Admit date - 05/31/2019   Admitting Physician Lavina Hamman, MD  Outpatient Primary MD for the patient is [name], MD  LOS - 3  Outpatient Specialists Dr. Earlie Server  No chief complaint on file.      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  On chemotherapy and plan for radiation.  He presented to the ED with 4 days of right-sided chest pain uncontrolled with home pain medication and associated shortness of breath. Patient found to have left upper and lower lobe small pulmonary embolism without heart strain. Admitted to stepdown unit for acute hypoxic respiratory failure.   Subjective:    reports difficulty breathing. sats in high 80s on NRB. Tachy to 120s and RR in 7s. Reports mild substernal chest discomfort and constipation for past 7-8 days.  Reports has not been able to cough up anything.   Assessment  & Plan :       Principal Problem: Acute respiratory failure with hypoxia ([place]) Persistently requiring NRB.  Appears to be secondary to his malignancy and partly due to PE (clot burden appears minimal on CT).  This morning sats in the high 80s and patient still having difficulty breathing.  ABG showing hypoxemia (PO2 of 69.5).  Positive balance of almost 3 L.  Received 40 mg IV Lasix yesterday, will order another dose today.  Continue stepdown monitoring. Started on IV Unasyn on 10/13 for suspected aspiration pneumonia.  Acute pulmonary emboli (HCC) Small left-sided PE.  Continue Lovenox.  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  Patient is not hemodynamically stable to receive radiation therapy as planned. Continue IV Decadron.  Oncology following. Continue nicotine patch  Active Problems:   Tobacco dependence Continue nicotine patch.  Cancer related pain Continue low-dose Dilaudid.  Patient reports being constipated for several days.  Increased MiraLAX dose.  Continue senna.  Protein calorie malnutrition, severe (BMI 18.75 kg/m) Continue nutrition supplement  Generalized  weakness and failure to thrive PT once able.   Overall condition is guarded.  Palliative care following.   Code Status :DNR  Family Communication  : None at bedside  Disposition Plan  : Pending clinical improvement  Barriers For Discharge : Active symptoms.  Continue stepdown monitoring  Consults  : Oncology  Procedures  : CT angiogram of chest  DVT Prophylaxis  : Therapeutic Lovenox  Lab Results  Component Value Date   PLT 123 (L) 05/19/2019    Antibiotics  :    Anti-infectives (From admission, onward)   Start     Dose/Rate Route Frequency Ordered Stop   05/18/19 1600  Ampicillin-Sulbactam (UNASYN) 3 g in sodium chloride 0.9 % 100 mL IVPB     3 g 200 mL/hr over 30 Minutes Intravenous Every 6 hours 05/18/19 1535          Objective:   Vitals:   05/19/19 0533 05/19/19 0600 05/19/19 0631 05/19/19 0801  BP:  (!) 141/122  (!) 155/105  Pulse: (!) 119 (!) 103 (!) 106 (!) 117  Resp: (!) 35 (!) 33 (!) 22 (!) 24  Temp:      TempSrc:      SpO2: 96% 94% 92% 92%  Weight:      Height:        Wt Readings from Last 3 Encounters:  05/16/19 66.2 kg  04/27/19 74.6 kg  04/26/19 79.4 kg     Intake/Output Summary (Last 24 hours) at 05/19/2019 0828 Last data filed at 05/19/2019 3710 Gross per 24 hour  Intake 302.87 ml  Output 1025 ml  Net -722.13 ml     Physical Exam  Gen: in respiratory distress on NRB HEENT:  moist mucosa, supple neck Chest: Tachypnic, coarse  breath sounds b/l CVS: S1&S2 tachycardic, no murmurs, rubs or gallop GI: soft, NT, ND, BS+ Musculoskeletal: warm, no edema CNS: AAOX3, non focal    Data Review:    CBC Recent Labs  Lab 06/02/2019 0159 05/16/19 0901 05/17/19 0242 05/18/19 0224 05/19/19 0210  WBC 23.9* 20.1* 15.4* 15.7* 15.6*  HGB 15.8 14.3 12.9* 13.6 13.0  HCT 48.9 45.3 40.7 41.6 41.3  PLT 98* 114* 121* 126* 123*  MCV 92.8 95.0 96.2 93.1 95.4  MCH 30.0 30.0 30.5 30.4 30.0  MCHC 32.3 31.6 31.7 32.7 31.5  RDW 14.9 15.6* 15.9* 15.8* 15.9*  LYMPHSABS 2.4  --   --   --   --   MONOABS 2.0*  --   --   --   --   EOSABS 0.0  --   --   --   --   BASOSABS 0.1  --   --   --   --     Chemistries  Recent Labs  Lab 05/14/2019 0159 05/16/19 0901 05/17/19 0242 05/18/19 0224 05/19/19 0210  NA 142 146* 146* 146* 145  K 4.6 5.9* 3.8 4.1 3.6  CL 102 109 114* 111 110  CO2 24 22 21* 23 22  GLUCOSE 103* 66* 109* 115* 139*  BUN 40* 29* 25* 25* 29*  CREATININE 1.00 0.99 0.80 0.80 0.75  CALCIUM 10.3 9.2 8.8* 9.2 9.3  AST 19 35  --   --   --   ALT 22 25  --   --   --   ALKPHOS 110 102  --   --   --   BILITOT 1.7* 3.1*  --   --   --    ------------------------------------------------------------------------------------------------------------------ No results for input(s): CHOL, HDL,  LDLCALC, TRIG, CHOLHDL, LDLDIRECT in the last 72 hours.  No results found for: HGBA1C ------------------------------------------------------------------------------------------------------------------ No results for input(s): TSH, T4TOTAL, T3FREE, THYROIDAB in the last 72 hours.  Invalid input(s): FREET3 ------------------------------------------------------------------------------------------------------------------ No results for input(s): VITAMINB12, FOLATE, FERRITIN, TIBC, IRON, RETICCTPCT in the last 72 hours.  Coagulation profile Recent Labs  Lab 05/14/2019 0159  INR 1.3*    No results for input(s): DDIMER in the last 72  hours.  Cardiac Enzymes No results for input(s): CKMB, TROPONINI, MYOGLOBIN in the last 168 hours.  Invalid input(s): CK ------------------------------------------------------------------------------------------------------------------    Component Value Date/Time   BNP 39.7 05/18/2019 0224    Inpatient Medications  Scheduled Meds:  Chlorhexidine Gluconate Cloth  6 each Topical Daily   dexamethasone (DECADRON) injection  8 mg Intravenous Q24H   enoxaparin (LOVENOX) injection  100 mg Subcutaneous Q24H   feeding supplement  1 Container Oral BID BM   feeding supplement (PRO-STAT SUGAR FREE 64)  30 mL Oral BID   furosemide  40 mg Intravenous Once   influenza vac split quadrivalent PF  0.5 mL Intramuscular Tomorrow-1000   mouth rinse  15 mL Mouth Rinse BID   mirtazapine  15 mg Oral QHS   nicotine  21 mg Transdermal Daily   senna-docusate  1 tablet Oral BID   sodium chloride flush  3 mL Intravenous Q12H   Continuous Infusions:  ampicillin-sulbactam (UNASYN) IV Stopped (05/19/19 0349)   PRN Meds:.acetaminophen **OR** acetaminophen, bisacodyl, HYDROmorphone (DILAUDID) injection, LORazepam, metoprolol tartrate, ondansetron **OR** ondansetron (ZOFRAN) IV, phenol, polyethylene glycol, Resource ThickenUp Clear  Micro Results Recent Results (from the past 240 hour(s))  Urine culture     Status: None   Collection Time: 05/31/2019  1:41 AM   Specimen: In/Out Cath Urine  Result Value Ref Range Status   Specimen Description   Final    IN/OUT CATH URINE Performed at [facility], [place] [address] [place], [place] [address]    Special Requests   Final    NONE Performed at [facility], [place] [address], [place], [place] [address]    Culture   Final    NO GROWTH Performed at [facility], [place] [address] [place], [place] [address]    Report Status 05/16/2019 FINAL  Final  SARS Coronavirus 2 by RT PCR (hospital order, performed in  [facility] hospital lab) Nasopharyngeal Nasopharyngeal Swab     Status: None   Collection Time: 06/01/2019  1:42 AM   Specimen: Nasopharyngeal Swab  Result Value Ref Range Status   SARS Coronavirus 2 NEGATIVE NEGATIVE Final    Comment: (NOTE) If result is NEGATIVE SARS-CoV-2 target nucleic acids are NOT DETECTED. The SARS-CoV-2 RNA is generally detectable in upper and lower  respiratory specimens during the acute phase of infection. The lowest  concentration of SARS-CoV-2 viral copies this assay can detect is 250  copies / mL. A negative result does not preclude SARS-CoV-2 infection  and should not be used as the sole basis for treatment or other  patient management decisions.  A negative result may occur with  improper specimen collection / handling, submission of specimen other  than nasopharyngeal swab, presence of viral mutation(s) within the  areas targeted by this assay, and inadequate number of viral copies  (<250 copies / mL). A negative result must be combined with clinical  observations, patient history, and epidemiological information. If result is POSITIVE SARS-CoV-2 target nucleic acids are DETECTED. The SARS-CoV-2 RNA is generally detectable in upper and lower  respiratory specimens dur ing the acute phase of infection.  Positive  results are indicative of active infection with SARS-CoV-2.  Clinical  correlation with patient history and other diagnostic information is  necessary to determine patient infection status.  Positive results do  not rule out bacterial infection or co-infection with other viruses. If result is PRESUMPTIVE POSTIVE SARS-CoV-2 nucleic acids MAY BE PRESENT.   A presumptive positive result was obtained on the submitted specimen  and confirmed on repeat testing.  While 2019 novel coronavirus  (SARS-CoV-2) nucleic acids may be present in the submitted sample  additional confirmatory testing may be necessary for epidemiological  and / or clinical  management purposes  to differentiate between  SARS-CoV-2 and other Sarbecovirus currently known to infect humans.  If clinically indicated additional testing with an alternate test  methodology [id number]) is advised. The SARS-CoV-2 RNA is generally  detectable in upper and lower respiratory sp ecimens during the acute  phase of infection. The expected result is Negative. Fact Sheet for Patients:  StrictlyIdeas.no Fact Sheet for Healthcare Providers: BankingDealers.co.za This test is not yet approved or cleared by the [place] FDA and has been authorized for detection and/or diagnosis of SARS-CoV-2 by FDA under an Emergency Use Authorization (EUA).  This EUA will remain in effect (meaning this test can be used) for the duration of the COVID-19 declaration under Section 564(b)(1) of the Act, 21 U.S.C. section 360bbb-3(b)(1), unless the authorization is terminated or revoked sooner. Performed at [facility], [place] [address], [place], [place] [address]   Blood Culture (routine x 2)     Status: None (Preliminary result)   Collection Time: 05/28/2019  1:59 AM   Specimen: BLOOD RIGHT WRIST  Result Value Ref Range Status   Specimen Description   Final    BLOOD RIGHT WRIST Performed at [facility], [address] [place], [place] [address]    Special Requests   Final    BOTTLES DRAWN AEROBIC AND ANAEROBIC Blood Culture adequate volume Performed at [place] [address] [place], [place] [address]    Culture   Final    NO GROWTH 3 DAYS Performed at [facility], [place] [address], [place], [place] [address]    Report Status PENDING  Incomplete  Blood Culture (routine x 2)     Status: None (Preliminary result)   Collection Time: 05/10/2019  2:00 AM   Specimen: BLOOD LEFT FOREARM  Result Value Ref Range Status   Specimen Description   Final    BLOOD LEFT FOREARM Performed at [facility], [place] [address], [place], [place] [address]    Special Requests   Final    BOTTLES DRAWN AEROBIC AND ANAEROBIC Blood Culture adequate volume Performed at [place] [address] [place], [place] [address]    Culture   Final    NO GROWTH 3 DAYS Performed at [facility], [place] [address] [place],  [address]    Report Status PENDING  Incomplete  MRSA PCR Screening     Status: None   Collection Time: 05/16/19 11:25 AM   Specimen: Nasal Mucosa; Nasopharyngeal  Result Value Ref Range Status   MRSA by PCR NEGATIVE NEGATIVE Final    Comment:        The GeneXpert MRSA Assay (FDA approved for NASAL specimens only), is one component of a comprehensive MRSA colonization surveillance program. It is not intended to diagnose MRSA infection nor  to guide or monitor treatment for MRSA infections. Performed at [facility], [place] [address], [place], [place] [address]     Radiology Reports Ct Angio Chest Pe W And/or Wo Contrast  Result Date: 05/17/2019 CLINICAL DATA:  Per EMS, Pt called EMS for right 9/10 flank pain. Steadily increasing pain for the last 4 days. Pt has stage 4 lung cancer. Last radiation treatment was 05/11/2019. Pt has prescription for morphine, last took yesterday. Omni 350/.*comment was truncated*^158mL OMNIPAQUE IOHEXOL 350 MG/ML SOLNPE suspected, high pretest prob EXAM: CT ANGIOGRAPHY CHEST WITH CONTRAST TECHNIQUE: Multidetector CT imaging of the chest was performed using the standard protocol during bolus administration of intravenous contrast. Multiplanar CT image reconstructions and MIPs were obtained to evaluate the vascular anatomy. CONTRAST:  193mL OMNIPAQUE IOHEXOL 350 MG/ML SOLN COMPARISON:  04/20/2019, PET-CT FINDINGS: Cardiovascular: A tubular filling defect in the LEFT lower lobe pulmonary artery (image 57/4 consistent with thromboembolus. This is partially occlusive. Small embolus in the LEFT upper  lobe pulmonary artery is partially wall adherent (image 37/4). No RIGHT ventricular strain evident. Mediastinum/Nodes: Bulky RIGHT axillary and RIGHT septic clavicular adenopathy again demonstrated. There is a mass of nodal tissue in the RIGHT supraclavicular region measuring 6.5 by 7.3 cm. This intense hypermetabolic on comparison PET scan. The nodal mass appears increased from roughly 5.4 x 5.4 cm. In the anterior mediastinum large prevascular lymph node measuring 3 cm similar. Small pericardial effusions increased. Lungs/Pleura: RIGHT upper lobe irregular mass measuring 3.6 cm not changed comparison exam. No pneumothorax. Pneumonia. Small focus of ground-glass density in the lateral aspect of the RIGHT middle lobe. Paraseptal emphysema the upper lobes. Upper Abdomen: Limited view of the liver, kidneys, pancreas are unremarkable. Normal adrenal glands. Musculoskeletal: No clear aggressive osseous lesion. Review of the MIP images confirms the above findings. IMPRESSION: 1. Small pulmonary emboli in the LEFT lower lobe and LEFT upper lobe. Overall clot burden mild. 2. Stable RIGHT upper lobe mass. 3. Bulky RIGHT axillary and supraclavicular adenopathy. 4. Interval increase in size of RIGHT supraclavicular nodal mass. 5. Small pericardial effusion increased. Aortic Atherosclerosis (ICD10-I70.0) and Emphysema (ICD10-J43.9). Findings conveyed [facility] on 05/19/2019  at04:51. Electronically Signed   By: Suzy Bouchard M.D.   On: 05/14/2019 04:51   Nm Pet Image Initial (pi) Skull Base To Thigh  Result Date: 04/20/2019 CLINICAL DATA:  Initial treatment strategy for metastatic lung cancer. EXAM: NUCLEAR MEDICINE PET SKULL BASE TO THIGH TECHNIQUE: 8.74 mCi F-18 FDG was injected intravenously. Full-ring PET imaging was performed from the skull base to thigh after the radiotracer. CT data was obtained and used for attenuation correction and anatomic localization. Fasting blood glucose: 99 mg/dl COMPARISON:   CT chest, abdomen and pelvis 03/30/2019 FINDINGS: Mediastinal blood pool activity: SUV max 2.95 Liver activity: SUV max NA NECK: Extensive FDG avid adenopathy is identified within right cervical lymph node chain. Hypermetabolic lymph nodes are identified within the right level 2, 3, 4 and 5 stations. Index right level 2 node measures 1.1 cm within SUV max of 18.47. Index right level 3/4 lymph node measures 2.2 cm short axis and has an SUV max of 20.12. Incidental CT findings: none CHEST: Extensive right supraclavicular hypermetabolic adenopathy identified. Index right supraclavicular lymph node measures 2.9 cm with SUV max of 18.58. Hypermetabolic right retropectoral and right axillary lymph nodes identified. Right axillary node measures 1.9 cm short axis and has an SUV max of 26.4. Multiple left supraclavicular hypermetabolic lymph nodes are identified. Index lymph node measures 1.3 cm  with SUV max of 12.59. Hypermetabolic bilateral mediastinal, subcarinal, and posterior mediastinal adenopathy identified: Right pre-vascular node measures 2.8 cm and has an SUV max of 14.1. Index left pre-vascular lymph node measures 1.5 cm with SUV max of 17.02. Subcarinal lymph node measures 1.2 cm within SUV max of 12.14. Posterior mediastinal lymph node between the aorta and esophagus adjacent to the left mainstem bronchus measures 2.3 cm and has an SUV max of 15.35. Right apical lung mass with central cavitation measures 4 cm and has an SUV max of 16.38. Incidental CT findings: Advanced bullous emphysema. Small pericardial effusion. Mild aortic atherosclerosis. ABDOMEN/PELVIS: No abnormal hypermetabolic activity within the liver, pancreas, adrenal glands, or spleen. No hypermetabolic lymph nodes in the abdomen or pelvis. Incidental CT findings: Aortic atherosclerosis. SKELETON: 2 foci of chest wall involvement are identified involving the upper medial aspect of the right pectoralis musculature with SUV max of 11.7. This appears  to be a direct extension from right supraclavicular tumor. On the left, there is asymmetric increased uptake along the left internal mammary lymph node chain with extension into the second left costosternal junction. No findings to suggest distant osseous metastatic disease within the axial or proximal appendicular skeleton. Incidental CT findings: none IMPRESSION: 1. Hypermetabolic right upper lobe lung mass is again noted compatible with primary bronchogenic carcinoma. 2. Bilateral mediastinal, bilateral supraclavicular, right subpectoral and right axillary, and right level 2, 3, 4, and 5 cervical hypermetabolic nodal metastasis. 3. Evidence of right upper chest wall involvement by tumor which is felt to be a direct extension from bulky right supraclavicular adenopathy. 4. Left chest wall involvement is also suspected at the left second costosternal junction. This may be the result of direct extension from left internal mammary nodal metastasis. Electronically Signed   By: Kerby Moors M.D.   On: 04/20/2019 14:11   Dg Chest Port 1 View  Result Date: 05/18/2019 CLINICAL DATA:  Dyspnea EXAM: PORTABLE CHEST 1 VIEW COMPARISON:  05/25/2019 FINDINGS: There is new and increased heterogeneous airspace opacity of the bilateral lung bases, particularly the right lung base. There is an unchanged, masslike opacity of the right upper lobe. The heart and mediastinum are unremarkable. IMPRESSION: 1. There is new and increased heterogeneous airspace opacity of the bilateral lung bases, particularly the right lung base, consistent with infection or aspiration. 2. Unchanged right upper lobe masslike opacity. Electronically Signed   By: Eddie Candle M.D.   On: 05/18/2019 09:05   Dg Chest Port 1 View  Result Date: 06/01/2019 CLINICAL DATA:  Flank pain. History of lung cancer. EXAM: PORTABLE CHEST 1 VIEW COMPARISON:  03/22/2019 chest radiograph and 03/30/2019 chest CT FINDINGS: Right apical opacities are consistent with  previously demonstrated cavitary mass. The lungs are otherwise clear. There is bullous emphysema apices. IMPRESSION: 1. No acute cardiopulmonary disease. 2. Unchanged appearance of the right apical cavitary mass. 3. Emphysema. Electronically Signed   By: Ulyses Jarred M.D.   On: 06/01/2019 02:34   Vas [place] Lower Extremity Venous (dvt)  Result Date: 05/18/2019  Lower Venous Study Indications: Edema.  Risk Factors: None identified. Comparison Study: No prior studies. Performing Technologist: Oliver Hum RVT  Examination Guidelines: A complete evaluation includes B-mode imaging, spectral Doppler, color Doppler, and power Doppler as needed of all accessible portions of each vessel. Bilateral testing is considered an integral part of a complete examination. Limited examinations for reoccurring indications may be performed as noted.  +---------+---------------+---------+-----------+----------+--------------+  RIGHT     Compressibility Phasicity Spontaneity Properties Thrombus Aging  +---------+---------------+---------+-----------+----------+--------------+  CFV       Full            Yes       Yes                                    +---------+---------------+---------+-----------+----------+--------------+  SFJ       Full                                                             +---------+---------------+---------+-----------+----------+--------------+  FV Prox   Full                                                             +---------+---------------+---------+-----------+----------+--------------+  FV Mid    Full                                                             +---------+---------------+---------+-----------+----------+--------------+  FV Distal Full                                                             +---------+---------------+---------+-----------+----------+--------------+  PFV       Full                                                              +---------+---------------+---------+-----------+----------+--------------+  POP       Full            Yes       Yes                                    +---------+---------------+---------+-----------+----------+--------------+  PTV       Full                                                             +---------+---------------+---------+-----------+----------+--------------+  PERO      Full                                                             +---------+---------------+---------+-----------+----------+--------------+   +---------+---------------+---------+-----------+----------+--------------+  LEFT      Compressibility Phasicity Spontaneity Properties Thrombus Aging  +---------+---------------+---------+-----------+----------+--------------+  CFV       Full            Yes       Yes                                    +---------+---------------+---------+-----------+----------+--------------+  SFJ       Full                                                             +---------+---------------+---------+-----------+----------+--------------+  FV Prox   Full                                                             +---------+---------------+---------+-----------+----------+--------------+  FV Mid    Full                                                             +---------+---------------+---------+-----------+----------+--------------+  FV Distal Full                                                             +---------+---------------+---------+-----------+----------+--------------+  PFV       Full                                                             +---------+---------------+---------+-----------+----------+--------------+  POP       Full            Yes       Yes                                    +---------+---------------+---------+-----------+----------+--------------+  PTV       Full                                                              +---------+---------------+---------+-----------+----------+--------------+  PERO      Full                                                             +---------+---------------+---------+-----------+----------+--------------+  Summary: Right: There is no evidence of deep vein thrombosis in the lower extremity. No cystic structure found in the popliteal fossa. Left: There is no evidence of deep vein thrombosis in the lower extremity. No cystic structure found in the popliteal fossa.  *See table(s) above for measurements and observations. Electronically signed by Deitra Mayo MD on 05/18/2019 at 3:32:05 PM.    Final     Time Spent in minutes  35   Kersti Scavone M.D on 05/19/2019 at 8:28 AM  Between 7am to 7pm - Pager - [phone]  After 7pm go to [email or web address] - password [facility]  [facility] -  Office  [phone]

## 2019-05-20 ENCOUNTER — Ambulatory Visit: Payer: Medicare Other

## 2019-05-20 DIAGNOSIS — I2693 Single subsegmental pulmonary embolism without acute cor pulmonale: Secondary | ICD-10-CM

## 2019-05-20 LAB — CULTURE, BLOOD (ROUTINE X 2)
Culture: NO GROWTH
Culture: NO GROWTH
Special Requests: ADEQUATE
Special Requests: ADEQUATE

## 2019-05-20 LAB — BLOOD GAS, ARTERIAL
Acid-Base Excess: 4.5 mmol/L — ABNORMAL HIGH (ref 0.0–2.0)
Bicarbonate: 28.5 mmol/L — ABNORMAL HIGH (ref 20.0–28.0)
O2 Saturation: 88.4 %
Patient temperature: 98.6
pCO2 arterial: 41.9 mmHg (ref 32.0–48.0)
pH, Arterial: 7.448 (ref 7.350–7.450)
pO2, Arterial: 59.2 mmHg — ABNORMAL LOW (ref 83.0–108.0)

## 2019-05-20 MED ORDER — FUROSEMIDE 10 MG/ML IJ SOLN
40.0000 mg | Freq: Once | INTRAMUSCULAR | Status: AC
  Administered 2019-05-20: 08:00:00 40 mg via INTRAVENOUS
  Filled 2019-05-20: qty 4

## 2019-05-20 MED ORDER — HYDROMORPHONE HCL 2 MG/ML IJ SOLN
2.0000 mg | INTRAMUSCULAR | Status: DC | PRN
  Administered 2019-05-20 – 2019-05-21 (×6): 2 mg via INTRAVENOUS
  Filled 2019-05-20 (×6): qty 1

## 2019-05-20 NOTE — Progress Notes (Signed)
Patient with increased hypoxia (sustaining 83-87%), shortness of breath, and increased work of breathing. Attempted to use NRBM and HFNC at the same time with no improvement. MD made aware, orders received to proceed with bipap. RT asked to place patient on bipap. Patient made aware of plan, and agreed. Will continue to monitor.

## 2019-05-20 NOTE — Progress Notes (Signed)
Patient anxious, removed bipap. RN to bedside. Patient refused NRBM also. Patient placed on 15L HFNC, O2 sats sustaining 93%. Patient educated on bipap use and would have to be placed back on it if he desaturated or appeared to have increased work of breathing. Patient stated he just did not want to wear it anymore at this time but verbalized understanding on future need. However, patient appears more confused at this time and states "he lives here." MD made aware. Will continue to monitor.

## 2019-05-20 NOTE — Progress Notes (Signed)
PT Cancellation Note  Patient Details Name: Justin Randall MRN: [id number] DOB: 22-May-1971   Cancelled Treatment:    Reason Eval/Treat Not Completed: Medical issues which prohibited therapy - Pt with increased work of breathing with increasing hypoxia, PT to check when more stable.    Julien Girt, PT Acute Rehabilitation Services Pager [phone]  Office [phone]    Roxine Caddy D Elonda Husky 05/20/2019, 11:29 AM

## 2019-05-20 NOTE — Progress Notes (Signed)
HEMATOLOGY-ONCOLOGY PROGRESS NOTE  SUBJECTIVE: Still reports being short of breath. Was placed on BiPAP this am, but patient kept pulling the mask off. Now on 15L HFNC with O2 sat 90-91%. Reports breathing may be slightly better compared with yesterday. Nursing notes that he has some confusion today. The patient currently thinks that he is at home. He told me that his wife has been in to see him, but nursing confirms that she has not been here today.   Oncology History  Adenocarcinoma, lung, right ([place])  04/13/2019 Initial Diagnosis   Adenocarcinoma, lung, right ([place])   05/03/2019 - 05/03/2019 Chemotherapy   The patient had palonosetron (ALOXI) injection 0.25 mg, 0.25 mg, Intravenous,  Once, 0 of 7 cycles CARBOplatin (PARAPLATIN) 260 mg in sodium chloride 0.9 % 100 mL chemo infusion, 260 mg (100 % of original dose 256.6 mg), Intravenous,  Once, 0 of 7 cycles Dose modification: 256.6 mg (original dose 256.6 mg, Cycle 1) PACLitaxel (TAXOL) 90 mg in sodium chloride 0.9 % 250 mL chemo infusion (</= 80mg /m2), 45 mg/m2 = 90 mg, Intravenous,  Once, 0 of 7 cycles  for chemotherapy treatment.    05/10/2019 -  Chemotherapy   The patient had pembrolizumab (KEYTRUDA) 200 mg in sodium chloride 0.9 % 50 mL chemo infusion, 200 mg, Intravenous, Once, 0 of 8 cycles  for chemotherapy treatment.       REVIEW OF SYSTEMS:   Constitutional: Denies fevers and chills Respiratory: Has ongoing hypoxia and shortness of breath.  Cardiovascular: Denies palpitation, chest discomfort Gastrointestinal:  Denies nausea, heartburn or change in bowel habits Skin: Denies abnormal skin rashes Lymphatics: Denies new lymphadenopathy or easy bruising Neurological:Denies numbness, tingling or new weaknesses Behavioral/Psych: Mood is stable, no new changes  Extremities: No lower extremity edema All other systems were reviewed with the patient and are negative.  I have reviewed the past medical history, past surgical history,  social history and family history with the patient and they are unchanged from previous note.   PHYSICAL EXAMINATION: ECOG PERFORMANCE STATUS: 3 - Symptomatic, >50% confined to bed  Vitals:   05/20/19 1102 05/20/19 1113  BP: (!) 154/110   Pulse: (!) 124 (!) 129  Resp: (!) 21 18  Temp:    SpO2: (!) 87% 94%   Filed Weights   05/18/2019 0018 06/05/2019 1051 05/16/19 1120  Weight: 162 lb (73.5 kg) 141 lb (64 kg) 146 lb (66.2 kg)    Intake/Output from previous day: 10/14 0701 - 10/15 0700 In: 361.2 [I.V.:3; IV Piggyback:358.2] Out: 2925 [Urine:2925]  GENERAL:alert, appears less short of breath today LUNGS: Diminished BS bilaterally HEART: Tachycardic, no LE edema ABDOMEN:abdomen soft, non-tender and normal bowel sounds Musculoskeletal:no cyanosis of digits and no clubbing  NEURO: alert & oriented x 3 with fluent speech, no focal motor/sensory deficits  LABORATORY DATA:  I have reviewed the data as listed CMP Latest Ref Rng & Units 05/19/2019 05/18/2019 05/17/2019  Glucose 70 - 99 mg/dL 139(H) 115(H) 109(H)  BUN 6 - 20 mg/dL 29(H) 25(H) 25(H)  Creatinine 0.61 - 1.24 mg/dL 0.75 0.80 0.80  Sodium 135 - 145 mmol/L 145 146(H) 146(H)  Potassium 3.5 - 5.1 mmol/L 3.6 4.1 3.8  Chloride 98 - 111 mmol/L 110 111 114(H)  CO2 22 - 32 mmol/L 22 23 21(L)  Calcium 8.9 - 10.3 mg/dL 9.3 9.2 8.8(L)  Total Protein 6.5 - 8.1 g/dL - - -  Total Bilirubin 0.3 - 1.2 mg/dL - - -  Alkaline Phos 38 - 126 U/L - - -  AST 15 - 41 U/L - - -  ALT 0 - 44 U/L - - -    Lab Results  Component Value Date   WBC 15.6 (H) 05/19/2019   HGB 13.0 05/19/2019   HCT 41.3 05/19/2019   MCV 95.4 05/19/2019   PLT 123 (L) 05/19/2019   NEUTROABS 19.1 (H) 05/21/2019    Ct Angio Chest Pe W And/or Wo Contrast  Result Date: 05/21/2019 CLINICAL DATA:  Per EMS, Pt called EMS for right 9/10 flank pain. Steadily increasing pain for the last 4 days. Pt has stage 4 lung cancer. Last radiation treatment was 05/11/2019. Pt has  prescription for morphine, last took yesterday. Omni 350/.*comment was truncated*^156mL OMNIPAQUE IOHEXOL 350 MG/ML SOLNPE suspected, high pretest prob EXAM: CT ANGIOGRAPHY CHEST WITH CONTRAST TECHNIQUE: Multidetector CT imaging of the chest was performed using the standard protocol during bolus administration of intravenous contrast. Multiplanar CT image reconstructions and MIPs were obtained to evaluate the vascular anatomy. CONTRAST:  134mL OMNIPAQUE IOHEXOL 350 MG/ML SOLN COMPARISON:  04/20/2019, PET-CT FINDINGS: Cardiovascular: A tubular filling defect in the LEFT lower lobe pulmonary artery (image 57/4 consistent with thromboembolus. This is partially occlusive. Small embolus in the LEFT upper lobe pulmonary artery is partially wall adherent (image 37/4). No RIGHT ventricular strain evident. Mediastinum/Nodes: Bulky RIGHT axillary and RIGHT septic clavicular adenopathy again demonstrated. There is a mass of nodal tissue in the RIGHT supraclavicular region measuring 6.5 by 7.3 cm. This intense hypermetabolic on comparison PET scan. The nodal mass appears increased from roughly 5.4 x 5.4 cm. In the anterior mediastinum large prevascular lymph node measuring 3 cm similar. Small pericardial effusions increased. Lungs/Pleura: RIGHT upper lobe irregular mass measuring 3.6 cm not changed comparison exam. No pneumothorax. Pneumonia. Small focus of ground-glass density in the lateral aspect of the RIGHT middle lobe. Paraseptal emphysema the upper lobes. Upper Abdomen: Limited view of the liver, kidneys, pancreas are unremarkable. Normal adrenal glands. Musculoskeletal: No clear aggressive osseous lesion. Review of the MIP images confirms the above findings. IMPRESSION: 1. Small pulmonary emboli in the LEFT lower lobe and LEFT upper lobe. Overall clot burden mild. 2. Stable RIGHT upper lobe mass. 3. Bulky RIGHT axillary and supraclavicular adenopathy. 4. Interval increase in size of RIGHT supraclavicular nodal mass. 5.  Small pericardial effusion increased. Aortic Atherosclerosis (ICD10-I70.0) and Emphysema (ICD10-J43.9). Findings conveyed [facility] on 05/28/2019  at04:51. Electronically Signed   By: Suzy Bouchard M.D.   On: 05/11/2019 04:51   Dg Chest Port 1 View  Result Date: 05/19/2019 CLINICAL DATA:  48 year old male with acute respiratory failure. EXAM: PORTABLE CHEST 1 VIEW COMPARISON:  Chest radiograph dated 05/18/2019 FINDINGS: Bibasilar densities may represent atelectasis or infiltrate appears similar to prior radiograph. There is a focal area of airspace density in the right upper lobe which is also similar to prior radiograph. No large pleural effusion or pneumothorax. There is a background of emphysema and biapical subpleural blebs. The cardiac silhouette is within normal limits. No acute osseous pathology. IMPRESSION: 1. No significant interval change in the appearance of the lungs compared to prior radiograph of 05/18/2019. 2. Focal area of airspace density in the right upper lobe similar to prior radiograph and CT. Electronically Signed   By: Anner Crete M.D.   On: 05/19/2019 15:40   Dg Chest Port 1 View  Result Date: 05/18/2019 CLINICAL DATA:  Dyspnea EXAM: PORTABLE CHEST 1 VIEW COMPARISON:  06/03/2019 FINDINGS: There is new and increased heterogeneous airspace opacity of the bilateral lung bases, particularly the  right lung base. There is an unchanged, masslike opacity of the right upper lobe. The heart and mediastinum are unremarkable. IMPRESSION: 1. There is new and increased heterogeneous airspace opacity of the bilateral lung bases, particularly the right lung base, consistent with infection or aspiration. 2. Unchanged right upper lobe masslike opacity. Electronically Signed   By: Eddie Candle M.D.   On: 05/18/2019 09:05   Dg Chest Port 1 View  Result Date: 05/14/2019 CLINICAL DATA:  Flank pain. History of lung cancer. EXAM: PORTABLE CHEST 1 VIEW COMPARISON:  03/22/2019 chest  radiograph and 03/30/2019 chest CT FINDINGS: Right apical opacities are consistent with previously demonstrated cavitary mass. The lungs are otherwise clear. There is bullous emphysema apices. IMPRESSION: 1. No acute cardiopulmonary disease. 2. Unchanged appearance of the right apical cavitary mass. 3. Emphysema. Electronically Signed   By: Ulyses Jarred M.D.   On: 05/30/2019 02:34   Vas [place] Lower Extremity Venous (dvt)  Result Date: 05/18/2019  Lower Venous Study Indications: Edema.  Risk Factors: None identified. Comparison Study: No prior studies. Performing Technologist: Oliver Hum RVT  Examination Guidelines: A complete evaluation includes B-mode imaging, spectral Doppler, color Doppler, and power Doppler as needed of all accessible portions of each vessel. Bilateral testing is considered an integral part of a complete examination. Limited examinations for reoccurring indications may be performed as noted.  +---------+---------------+---------+-----------+----------+--------------+ RIGHT    CompressibilityPhasicitySpontaneityPropertiesThrombus Aging +---------+---------------+---------+-----------+----------+--------------+ CFV      Full           Yes      Yes                                 +---------+---------------+---------+-----------+----------+--------------+ SFJ      Full                                                        +---------+---------------+---------+-----------+----------+--------------+ FV Prox  Full                                                        +---------+---------------+---------+-----------+----------+--------------+ FV Mid   Full                                                        +---------+---------------+---------+-----------+----------+--------------+ FV DistalFull                                                        +---------+---------------+---------+-----------+----------+--------------+ PFV      Full                                                         +---------+---------------+---------+-----------+----------+--------------+  POP      Full           Yes      Yes                                 +---------+---------------+---------+-----------+----------+--------------+ PTV      Full                                                        +---------+---------------+---------+-----------+----------+--------------+ PERO     Full                                                        +---------+---------------+---------+-----------+----------+--------------+   +---------+---------------+---------+-----------+----------+--------------+ LEFT     CompressibilityPhasicitySpontaneityPropertiesThrombus Aging +---------+---------------+---------+-----------+----------+--------------+ CFV      Full           Yes      Yes                                 +---------+---------------+---------+-----------+----------+--------------+ SFJ      Full                                                        +---------+---------------+---------+-----------+----------+--------------+ FV Prox  Full                                                        +---------+---------------+---------+-----------+----------+--------------+ FV Mid   Full                                                        +---------+---------------+---------+-----------+----------+--------------+ FV DistalFull                                                        +---------+---------------+---------+-----------+----------+--------------+ PFV      Full                                                        +---------+---------------+---------+-----------+----------+--------------+ POP      Full           Yes      Yes                                 +---------+---------------+---------+-----------+----------+--------------+  PTV      Full                                                         +---------+---------------+---------+-----------+----------+--------------+ PERO     Full                                                        +---------+---------------+---------+-----------+----------+--------------+     Summary: Right: There is no evidence of deep vein thrombosis in the lower extremity. No cystic structure found in the popliteal fossa. Left: There is no evidence of deep vein thrombosis in the lower extremity. No cystic structure found in the popliteal fossa.  *See table(s) above for measurements and observations. Electronically signed by Deitra Mayo MD on 05/18/2019 at 3:32:05 PM.    Final     ASSESSMENT AND PLAN: 1.  Metastatic non-small cell lung cancer, adenocarcinoma 2.  Acute respiratory failure with hypoxia 3.  Pulmonary emboli 4.  Painful right shoulder metastatic disease  -Continues to have shortness of breath and hypoxia. Did not tolerate BiPAP. Now on 15L HFNC. Had IV Lasix again today. On Solumedrol. Pulmonology following.  -Pain is currently controlled with current meds.  Radiation oncology is aware of the patient's admission.  We will notify them when the patient is more stable so that they can resume palliative radiation. -Continue Lovenox. -The patient remains interested in pursuing treatment for his cancer. If respiratory status improves, we can consider Keytruda. I again discussed with him today that if he does not improve, we would recommend hospice.   No family at bedside today. I will try to come back by later to see if I can speak with his wife.    LOS: 4 days   Mikey Bussing, DNP, AGPCNP-BC, AOCNP 05/20/19

## 2019-05-20 NOTE — Progress Notes (Signed)
PROGRESS NOTE                                                                                                                                                                                                             Patient Demographics:    Justin Randall, is a 48 y.o. male, DOB - September 28, 1970, GLO:[id number]  Admit date - 05/14/2019   Admitting Physician Lavina Hamman, MD  Outpatient Primary MD for the patient is [name], MD  LOS - 4  Outpatient Specialists Dr. Earlie Server  No chief complaint on file.      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  On chemotherapy and plan for radiation.  He presented to the ED with 4 days of right-sided chest pain uncontrolled with home pain medication and associated shortness of breath. Patient found to have left upper and lower lobe small pulmonary embolism without heart strain. Admitted to stepdown unit for acute hypoxic respiratory failure.   Subjective:   Still in respiratory discomfort with sats in low 90s on NRB.  Tachycardic and tachypneic.  Says his breathing to be slightly better from yesterday.  Assessment  & Plan :       Principal Problem: Acute respiratory failure with hypoxia ([place]) Persistently requiring NRB.  Appears to be secondary to his malignancy and partly due to PE (clot burden appears minimal on CT and likely does not contribute to his severe hypoxemia.).  O2 sat in low 90s on nonrebreather.  Pulmonary consult appreciated and placed on IV Solu-Medrol 60 mg every 8 hours.  Recommend trial of heated high flow and if hypoxia worsens, use BiPAP. Continue stepdown monitoring. Has responded well to IV Lasix, still has positive balance and ordered another 40 mg IV Lasix today.  Acute pulmonary emboli (HCC) Small left-sided PE.  Continue Lovenox.  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  Patient is not hemodynamically stable to receive radiation therapy as planned. IV Decadron switched to IV Solu-Medrol.  Oncology following. Continue nicotine patch  Active Problems:   Tobacco dependence Continue nicotine patch.  Cancer related pain Continue low-dose Dilaudid.  Patient reports being constipated for several days.  Increased MiraLAX dose.  Continue senna.  Protein calorie malnutrition, severe (BMI 18.75 kg/m) Continue nutrition supplement  Generalized  weakness and failure to thrive PT once able.   Overall condition is guarded.  Palliative care following.   Code Status :DNR  Family Communication  : None at bedside.  We will update his wife  Disposition Plan  : Pending clinical improvement  Barriers For Discharge : Active symptoms.  Continue stepdown monitoring  Consults  : Oncology  Procedures  : CT angiogram of chest  DVT Prophylaxis  : Therapeutic Lovenox  Lab Results  Component Value Date   PLT 123 (L) 05/19/2019    Antibiotics  :    Anti-infectives (From admission, onward)   Start     Dose/Rate Route Frequency Ordered Stop   05/18/19 1600  Ampicillin-Sulbactam (UNASYN) 3 g in sodium chloride 0.9 % 100 mL IVPB     3 g 200 mL/hr over 30 Minutes Intravenous Every 6 hours 05/18/19 1535          Objective:   Vitals:   05/20/19 0800 05/20/19 0803 05/20/19 0900 05/20/19 0909  BP: (!) 160/137 (!) 145/101 (!) 130/112 (!) 138/102  Pulse: (!) 129 (!) 130 (!) 127 (!) 121  Resp: 18 (!) 24 (!) 24 13  Temp:      TempSrc:      SpO2: 90% 92% (!) 88% (!) 85%  Weight:      Height:        Wt Readings from Last 3 Encounters:  05/16/19 66.2 kg  04/27/19 74.6 kg  04/26/19 79.4 kg     Intake/Output Summary (Last 24 hours) at 05/20/2019 1010 Last data filed at 05/20/2019 0411 Gross per 24 hour  Intake 361.19 ml  Output 2175 ml  Net -1813.81 ml    Physical exam In some distress, on NRB HEENT:  Moist mucosa, supple neck Chest: Bilateral coarse breath sounds CVS: S1-S2 tachycardic, no murmurs GI: Soft, nondistended, nontender Musculoskeletal: Warm, no edema    Data Review:    CBC Recent Labs  Lab 05/16/2019 0159 05/16/19 0901 05/17/19 0242 05/18/19 0224 05/19/19 0210  WBC 23.9* 20.1* 15.4* 15.7* 15.6*  HGB 15.8 14.3 12.9* 13.6 13.0  HCT 48.9 45.3 40.7 41.6 41.3  PLT 98* 114* 121* 126* 123*  MCV 92.8 95.0 96.2 93.1 95.4  MCH 30.0 30.0 30.5 30.4 30.0  MCHC 32.3 31.6 31.7 32.7 31.5  RDW 14.9 15.6* 15.9* 15.8* 15.9*  LYMPHSABS 2.4  --   --   --   --   MONOABS 2.0*  --   --   --   --   EOSABS 0.0  --   --   --   --   BASOSABS 0.1  --   --   --   --     Chemistries  Recent Labs  Lab 05/24/2019 0159 05/16/19 0901 05/17/19 0242 05/18/19 0224 05/19/19 0210  NA 142 146* 146* 146* 145  K 4.6 5.9* 3.8 4.1 3.6  CL 102 109 114* 111 110  CO2 24 22 21* 23 22  GLUCOSE 103* 66* 109* 115* 139*  BUN 40* 29* 25* 25* 29*  CREATININE 1.00 0.99 0.80 0.80 0.75  CALCIUM 10.3 9.2 8.8* 9.2 9.3  AST 19 35  --   --   --   ALT 22 25  --   --   --   ALKPHOS 110 102  --   --   --   BILITOT 1.7* 3.1*  --   --   --    ------------------------------------------------------------------------------------------------------------------ No results for input(s): CHOL, HDL, LDLCALC, TRIG, CHOLHDL, LDLDIRECT in  the last 72 hours.  No results found for: HGBA1C ------------------------------------------------------------------------------------------------------------------ No results for input(s): TSH, T4TOTAL, T3FREE, THYROIDAB in the last 72 hours.  Invalid input(s): FREET3 ------------------------------------------------------------------------------------------------------------------ No results for input(s): VITAMINB12, FOLATE, FERRITIN, TIBC, IRON, RETICCTPCT in the last 72 hours.  Coagulation profile Recent Labs  Lab 05/27/2019 0159  INR 1.3*    No results for input(s): DDIMER in  the last 72 hours.  Cardiac Enzymes No results for input(s): CKMB, TROPONINI, MYOGLOBIN in the last 168 hours.  Invalid input(s): CK ------------------------------------------------------------------------------------------------------------------    Component Value Date/Time   BNP 39.7 05/18/2019 0224    Inpatient Medications  Scheduled Meds:  Chlorhexidine Gluconate Cloth  6 each Topical Daily   enoxaparin (LOVENOX) injection  100 mg Subcutaneous Q24H   feeding supplement  1 Container Oral BID BM   feeding supplement (ENSURE ENLIVE)  237 mL Oral Q24H   influenza vac split quadrivalent PF  0.5 mL Intramuscular Tomorrow-1000   mouth rinse  15 mL Mouth Rinse BID   methylPREDNISolone (SOLU-MEDROL) injection  60 mg Intravenous Q6H   mirtazapine  15 mg Oral QHS   multivitamin with minerals  1 tablet Oral Daily   nicotine  21 mg Transdermal Daily   polyethylene glycol  17 g Oral BID   senna-docusate  1 tablet Oral BID   sodium chloride flush  3 mL Intravenous Q12H   Continuous Infusions:  ampicillin-sulbactam (UNASYN) IV Stopped (05/20/19 0332)   PRN Meds:.acetaminophen **OR** acetaminophen, bisacodyl, HYDROmorphone (DILAUDID) injection, LORazepam, metoprolol tartrate, ondansetron **OR** ondansetron (ZOFRAN) IV, phenol, Resource ThickenUp Clear  Micro Results Recent Results (from the past 240 hour(s))  Urine culture     Status: None   Collection Time: 05/10/2019  1:41 AM   Specimen: In/Out Cath Urine  Result Value Ref Range Status   Specimen Description   Final    IN/OUT CATH URINE Performed at [facility], [place] [address] [place], [place] [address]    Special Requests   Final    NONE Performed at [facility], [place] [address], [place], [place] [address]    Culture   Final    NO GROWTH Performed at [facility], [place] [address], [place], [place] [address]    Report Status 05/16/2019 FINAL  Final  SARS Coronavirus 2  by RT PCR (hospital order, performed in [facility] hospital lab) Nasopharyngeal Nasopharyngeal Swab     Status: None   Collection Time: 05/21/2019  1:42 AM   Specimen: Nasopharyngeal Swab  Result Value Ref Range Status   SARS Coronavirus 2 NEGATIVE NEGATIVE Final    Comment: (NOTE) If result is NEGATIVE SARS-CoV-2 target nucleic acids are NOT DETECTED. The SARS-CoV-2 RNA is generally detectable in upper and lower  respiratory specimens during the acute phase of infection. The lowest  concentration of SARS-CoV-2 viral copies this assay can detect is 250  copies / mL. A negative result does not preclude SARS-CoV-2 infection  and should not be used as the sole basis for treatment or other  patient management decisions.  A negative result may occur with  improper specimen collection / handling, submission of specimen other  than nasopharyngeal swab, presence of viral mutation(s) within the  areas targeted by this assay, and inadequate number of viral copies  (<250 copies / mL). A negative result must be combined with clinical  observations, patient history, and epidemiological information. If result is POSITIVE SARS-CoV-2 target nucleic acids are DETECTED. The SARS-CoV-2 RNA is generally detectable in upper  and lower  respiratory specimens dur ing the acute phase of infection.  Positive  results are indicative of active infection with SARS-CoV-2.  Clinical  correlation with patient history and other diagnostic information is  necessary to determine patient infection status.  Positive results do  not rule out bacterial infection or co-infection with other viruses. If result is PRESUMPTIVE POSTIVE SARS-CoV-2 nucleic acids MAY BE PRESENT.   A presumptive positive result was obtained on the submitted specimen  and confirmed on repeat testing.  While 2019 novel coronavirus  (SARS-CoV-2) nucleic acids may be present in the submitted sample  additional confirmatory testing may be necessary for  epidemiological  and / or clinical management purposes  to differentiate between  SARS-CoV-2 and other Sarbecovirus currently known to infect humans.  If clinically indicated additional testing with an alternate test  methodology [id number]) is advised. The SARS-CoV-2 RNA is generally  detectable in upper and lower respiratory sp ecimens during the acute  phase of infection. The expected result is Negative. Fact Sheet for Patients:  StrictlyIdeas.no Fact Sheet for Healthcare Providers: BankingDealers.co.za This test is not yet approved or cleared by the [place] FDA and has been authorized for detection and/or diagnosis of SARS-CoV-2 by FDA under an Emergency Use Authorization (EUA).  This EUA will remain in effect (meaning this test can be used) for the duration of the COVID-19 declaration under Section 564(b)(1) of the Act, 21 U.S.C. section 360bbb-3(b)(1), unless the authorization is terminated or revoked sooner. Performed at [facility], [place] [address], [place], [place] [address]   Blood Culture (routine x 2)     Status: None   Collection Time: 06/01/2019  1:59 AM   Specimen: BLOOD RIGHT WRIST  Result Value Ref Range Status   Specimen Description   Final    BLOOD RIGHT WRIST Performed at [facility], [address] [place] [address], [place], [place] [address]    Special Requests   Final    BOTTLES DRAWN AEROBIC AND ANAEROBIC Blood Culture adequate volume Performed at [place] [address], [place], [place] [address]    Culture   Final    NO GROWTH 5 DAYS Performed at [facility], [place] [address] [place], [place] [address]    Report Status 05/20/2019 FINAL  Final  Blood Culture (routine x 2)     Status: None   Collection Time: 05/29/2019  2:00 AM   Specimen: BLOOD LEFT FOREARM  Result Value Ref Range Status   Specimen Description   Final    BLOOD LEFT FOREARM Performed at [facility], [place] [address] [place], [place] [address]    Special Requests   Final    BOTTLES DRAWN AEROBIC AND ANAEROBIC Blood Culture adequate volume Performed at [place] [address] [place], [place] [address]    Culture   Final    NO GROWTH 5 DAYS Performed at [facility], [place] [address], [place], [place] [address]    Report Status 05/20/2019 FINAL  Final  MRSA PCR Screening     Status: None   Collection Time: 05/16/19 11:25 AM   Specimen: Nasal Mucosa; Nasopharyngeal  Result Value Ref Range Status   MRSA by PCR NEGATIVE NEGATIVE Final    Comment:        The GeneXpert MRSA Assay (FDA approved for NASAL specimens only), is one component of a comprehensive MRSA colonization surveillance program. It is not intended to diagnose MRSA infection  nor to guide or monitor treatment for MRSA infections. Performed at [facility], [place] [address], [place], [place] [address]     Radiology Reports Ct Angio Chest Pe W And/or Wo Contrast  Result Date: 05/14/2019 CLINICAL DATA:  Per EMS, Pt called EMS for right 9/10 flank pain. Steadily increasing pain for the last 4 days. Pt has stage 4 lung cancer. Last radiation treatment was 05/11/2019. Pt has prescription for morphine, last took yesterday. Omni 350/.*comment was truncated*^117mL OMNIPAQUE IOHEXOL 350 MG/ML SOLNPE suspected, high pretest prob EXAM: CT ANGIOGRAPHY CHEST WITH CONTRAST TECHNIQUE: Multidetector CT imaging of the chest was performed using the standard protocol during bolus administration of intravenous contrast. Multiplanar CT image reconstructions and MIPs were obtained to evaluate the vascular anatomy. CONTRAST:  154mL OMNIPAQUE IOHEXOL 350 MG/ML SOLN COMPARISON:  04/20/2019, PET-CT FINDINGS: Cardiovascular: A tubular filling defect in the LEFT lower lobe pulmonary artery (image 57/4 consistent with thromboembolus. This is partially occlusive. Small embolus in the LEFT upper lobe  pulmonary artery is partially wall adherent (image 37/4). No RIGHT ventricular strain evident. Mediastinum/Nodes: Bulky RIGHT axillary and RIGHT septic clavicular adenopathy again demonstrated. There is a mass of nodal tissue in the RIGHT supraclavicular region measuring 6.5 by 7.3 cm. This intense hypermetabolic on comparison PET scan. The nodal mass appears increased from roughly 5.4 x 5.4 cm. In the anterior mediastinum large prevascular lymph node measuring 3 cm similar. Small pericardial effusions increased. Lungs/Pleura: RIGHT upper lobe irregular mass measuring 3.6 cm not changed comparison exam. No pneumothorax. Pneumonia. Small focus of ground-glass density in the lateral aspect of the RIGHT middle lobe. Paraseptal emphysema the upper lobes. Upper Abdomen: Limited view of the liver, kidneys, pancreas are unremarkable. Normal adrenal glands. Musculoskeletal: No clear aggressive osseous lesion. Review of the MIP images confirms the above findings. IMPRESSION: 1. Small pulmonary emboli in the LEFT lower lobe and LEFT upper lobe. Overall clot burden mild. 2. Stable RIGHT upper lobe mass. 3. Bulky RIGHT axillary and supraclavicular adenopathy. 4. Interval increase in size of RIGHT supraclavicular nodal mass. 5. Small pericardial effusion increased. Aortic Atherosclerosis (ICD10-I70.0) and Emphysema (ICD10-J43.9). Findings conveyed [facility] on 06/05/2019  at04:51. Electronically Signed   By: Suzy Bouchard M.D.   On: 05/17/2019 04:51   Nm Pet Image Initial (pi) Skull Base To Thigh  Result Date: 04/20/2019 CLINICAL DATA:  Initial treatment strategy for metastatic lung cancer. EXAM: NUCLEAR MEDICINE PET SKULL BASE TO THIGH TECHNIQUE: 8.74 mCi F-18 FDG was injected intravenously. Full-ring PET imaging was performed from the skull base to thigh after the radiotracer. CT data was obtained and used for attenuation correction and anatomic localization. Fasting blood glucose: 99 mg/dl COMPARISON:  CT  chest, abdomen and pelvis 03/30/2019 FINDINGS: Mediastinal blood pool activity: SUV max 2.95 Liver activity: SUV max NA NECK: Extensive FDG avid adenopathy is identified within right cervical lymph node chain. Hypermetabolic lymph nodes are identified within the right level 2, 3, 4 and 5 stations. Index right level 2 node measures 1.1 cm within SUV max of 18.47. Index right level 3/4 lymph node measures 2.2 cm short axis and has an SUV max of 20.12. Incidental CT findings: none CHEST: Extensive right supraclavicular hypermetabolic adenopathy identified. Index right supraclavicular lymph node measures 2.9 cm with SUV max of 18.58. Hypermetabolic right retropectoral and right axillary lymph nodes identified. Right axillary node measures 1.9 cm short axis and has an SUV max of 26.4. Multiple left supraclavicular hypermetabolic lymph nodes are identified. Index lymph node measures 1.3  cm with SUV max of 12.59. Hypermetabolic bilateral mediastinal, subcarinal, and posterior mediastinal adenopathy identified: Right pre-vascular node measures 2.8 cm and has an SUV max of 14.1. Index left pre-vascular lymph node measures 1.5 cm with SUV max of 17.02. Subcarinal lymph node measures 1.2 cm within SUV max of 12.14. Posterior mediastinal lymph node between the aorta and esophagus adjacent to the left mainstem bronchus measures 2.3 cm and has an SUV max of 15.35. Right apical lung mass with central cavitation measures 4 cm and has an SUV max of 16.38. Incidental CT findings: Advanced bullous emphysema. Small pericardial effusion. Mild aortic atherosclerosis. ABDOMEN/PELVIS: No abnormal hypermetabolic activity within the liver, pancreas, adrenal glands, or spleen. No hypermetabolic lymph nodes in the abdomen or pelvis. Incidental CT findings: Aortic atherosclerosis. SKELETON: 2 foci of chest wall involvement are identified involving the upper medial aspect of the right pectoralis musculature with SUV max of 11.7. This appears to  be a direct extension from right supraclavicular tumor. On the left, there is asymmetric increased uptake along the left internal mammary lymph node chain with extension into the second left costosternal junction. No findings to suggest distant osseous metastatic disease within the axial or proximal appendicular skeleton. Incidental CT findings: none IMPRESSION: 1. Hypermetabolic right upper lobe lung mass is again noted compatible with primary bronchogenic carcinoma. 2. Bilateral mediastinal, bilateral supraclavicular, right subpectoral and right axillary, and right level 2, 3, 4, and 5 cervical hypermetabolic nodal metastasis. 3. Evidence of right upper chest wall involvement by tumor which is felt to be a direct extension from bulky right supraclavicular adenopathy. 4. Left chest wall involvement is also suspected at the left second costosternal junction. This may be the result of direct extension from left internal mammary nodal metastasis. Electronically Signed   By: Kerby Moors M.D.   On: 04/20/2019 14:11   Dg Chest Port 1 View  Result Date: 05/19/2019 CLINICAL DATA:  48 year old male with acute respiratory failure. EXAM: PORTABLE CHEST 1 VIEW COMPARISON:  Chest radiograph dated 05/18/2019 FINDINGS: Bibasilar densities may represent atelectasis or infiltrate appears similar to prior radiograph. There is a focal area of airspace density in the right upper lobe which is also similar to prior radiograph. No large pleural effusion or pneumothorax. There is a background of emphysema and biapical subpleural blebs. The cardiac silhouette is within normal limits. No acute osseous pathology. IMPRESSION: 1. No significant interval change in the appearance of the lungs compared to prior radiograph of 05/18/2019. 2. Focal area of airspace density in the right upper lobe similar to prior radiograph and CT. Electronically Signed   By: Anner Crete M.D.   On: 05/19/2019 15:40   Dg Chest Port 1 View  Result  Date: 05/18/2019 CLINICAL DATA:  Dyspnea EXAM: PORTABLE CHEST 1 VIEW COMPARISON:  05/24/2019 FINDINGS: There is new and increased heterogeneous airspace opacity of the bilateral lung bases, particularly the right lung base. There is an unchanged, masslike opacity of the right upper lobe. The heart and mediastinum are unremarkable. IMPRESSION: 1. There is new and increased heterogeneous airspace opacity of the bilateral lung bases, particularly the right lung base, consistent with infection or aspiration. 2. Unchanged right upper lobe masslike opacity. Electronically Signed   By: Eddie Candle M.D.   On: 05/18/2019 09:05   Dg Chest Port 1 View  Result Date: 05/08/2019 CLINICAL DATA:  Flank pain. History of lung cancer. EXAM: PORTABLE CHEST 1 VIEW COMPARISON:  03/22/2019 chest radiograph and 03/30/2019 chest CT FINDINGS: Right apical opacities are  consistent with previously demonstrated cavitary mass. The lungs are otherwise clear. There is bullous emphysema apices. IMPRESSION: 1. No acute cardiopulmonary disease. 2. Unchanged appearance of the right apical cavitary mass. 3. Emphysema. Electronically Signed   By: Ulyses Jarred M.D.   On: 06/01/2019 02:34   Vas [place] Lower Extremity Venous (dvt)  Result Date: 05/18/2019  Lower Venous Study Indications: Edema.  Risk Factors: None identified. Comparison Study: No prior studies. Performing Technologist: Oliver Hum RVT  Examination Guidelines: A complete evaluation includes B-mode imaging, spectral Doppler, color Doppler, and power Doppler as needed of all accessible portions of each vessel. Bilateral testing is considered an integral part of a complete examination. Limited examinations for reoccurring indications may be performed as noted.  +---------+---------------+---------+-----------+----------+--------------+  RIGHT     Compressibility Phasicity Spontaneity Properties Thrombus Aging   +---------+---------------+---------+-----------+----------+--------------+  CFV       Full            Yes       Yes                                    +---------+---------------+---------+-----------+----------+--------------+  SFJ       Full                                                             +---------+---------------+---------+-----------+----------+--------------+  FV Prox   Full                                                             +---------+---------------+---------+-----------+----------+--------------+  FV Mid    Full                                                             +---------+---------------+---------+-----------+----------+--------------+  FV Distal Full                                                             +---------+---------------+---------+-----------+----------+--------------+  PFV       Full                                                             +---------+---------------+---------+-----------+----------+--------------+  POP       Full            Yes       Yes                                    +---------+---------------+---------+-----------+----------+--------------+  PTV       Full                                                             +---------+---------------+---------+-----------+----------+--------------+  PERO      Full                                                             +---------+---------------+---------+-----------+----------+--------------+   +---------+---------------+---------+-----------+----------+--------------+  LEFT      Compressibility Phasicity Spontaneity Properties Thrombus Aging  +---------+---------------+---------+-----------+----------+--------------+  CFV       Full            Yes       Yes                                    +---------+---------------+---------+-----------+----------+--------------+  SFJ       Full                                                              +---------+---------------+---------+-----------+----------+--------------+  FV Prox   Full                                                             +---------+---------------+---------+-----------+----------+--------------+  FV Mid    Full                                                             +---------+---------------+---------+-----------+----------+--------------+  FV Distal Full                                                             +---------+---------------+---------+-----------+----------+--------------+  PFV       Full                                                             +---------+---------------+---------+-----------+----------+--------------+  POP       Full            Yes       Yes                                    +---------+---------------+---------+-----------+----------+--------------+  PTV       Full                                                             +---------+---------------+---------+-----------+----------+--------------+  PERO      Full                                                             +---------+---------------+---------+-----------+----------+--------------+     Summary: Right: There is no evidence of deep vein thrombosis in the lower extremity. No cystic structure found in the popliteal fossa. Left: There is no evidence of deep vein thrombosis in the lower extremity. No cystic structure found in the popliteal fossa.  *See table(s) above for measurements and observations. Electronically signed by Deitra Mayo MD on 05/18/2019 at 3:32:05 PM.    Final     Time Spent in minutes  35   Jathan Balling M.D on 05/20/2019 at 10:10 AM  Between 7am to 7pm - Pager - [phone]  After 7pm go to [email or web address] - password [facility]  [facility] -  Office  [phone]

## 2019-05-20 NOTE — Progress Notes (Signed)
Name: Justin Randall MRN: [id number] DOB: 03-15-1971    ADMISSION DATE:  05/14/2019 CONSULTATION DATE:  05/20/2019   REFERRING MD :  Clementeen Graham, MD  CHIEF COMPLAINT: Respiratory distress and hypoxia  BRIEF PATIENT DESCRIPTION: 48 year old man with metastatic non-small cell lung cancer and acute PE with acute hypoxic respiratory failure  SIGNIFICANT EVENTS    STUDIES:  CT angios chest 10/10 >> 1. Small pulmonary emboli in the LEFT lower lobe and LEFT upper lobe. Overall clot burden mild. 2. Stable RIGHT upper lobe mass. 3. Bulky RIGHT axillary and supraclavicular adenopathy. 4. Interval increase in size of RIGHT supraclavicular nodal mass. 5. Small pericardial effusion increased   HISTORY OF PRESENT ILLNESS: 48 year old smoker was diagnosed in 04/2019 with stage IV metastatic adenocarcinoma when he presented with right upper lobe lung mass, bilateral mediastinal, supraclavicular axillary and cervical lymphadenopathy as well as left chest wall invasion.  PDL 1 expression was 100%.    He received first cycle of carboplatin and paclitaxel on 9/28 Plan was for palliative radiation to left chest wall mass He was admitted 10/10 with right-sided chest pain and hypoxia initially required 4 L oxygen but rapidly progressed to nonrebreather, he has been hypoxic to this level for the last 2 days.  He was placed on therapeutic Lovenox after finding of pulmonary embolism on CT. We are consulted due to worsening hypoxia and respiratory distress with O2 saturations 87% on nonrebreather.  He was given 40 mg of Lasix and has diuresed 1.4 L Was also started on Unasyn for aspiration pneumonia   SUBJECTIVE: Remains hypoxic, requiring nonrebreather Afebrile Good urine output with Lasix  VITAL SIGNS: Temp:  [97.5 F (36.4 C)-98.4 F (36.9 C)] 98.2 F (36.8 C) (10/15 0703) Pulse Rate:  [109-130] 130 (10/15 0803) Resp:  [14-36] 24 (10/15 0803) BP: (130-168)/(93-143) 145/101 (10/15 0803) SpO2:  [72 %-95  %] 92 % (10/15 0803)  PHYSICAL EXAMINATION: Gen. Pleasant, well-nourished, in mild distress, depressed affect ENT -mild pallor, no icterus Neck: No JVD, no thyromegaly, no carotid bruits , right supraclavicular lymphadenopathy, ulcerative Lungs: + use of accessory muscles, no dullness to percussion, decreased BL with right basal rales no rhonchi  Cardiovascular: Rhythm regular, heart sounds  normal, no murmurs or gallops, no peripheral edema Abdomen: soft and non-tender, no hepatosplenomegaly, BS normal. Musculoskeletal: No deformities, no cyanosis , clubbing 1+ Neuro:  alert, non focal   Chest x-ray 10/14 was personally reviewed and this shows bibasal atelectasis/infiltrate and right upper lobe infiltrate  Recent Labs  Lab 05/17/19 0242 05/18/19 0224 05/19/19 0210  NA 146* 146* 145  K 3.8 4.1 3.6  CL 114* 111 110  CO2 21* 23 22  BUN 25* 25* 29*  CREATININE 0.80 0.80 0.75  GLUCOSE 109* 115* 139*   Recent Labs  Lab 05/17/19 0242 05/18/19 0224 05/19/19 0210  HGB 12.9* 13.6 13.0  HCT 40.7 41.6 41.3  WBC 15.4* 15.7* 15.6*  PLT 121* 126* 123*   Dg Chest Port 1 View  Result Date: 05/19/2019 CLINICAL DATA:  48 year old male with acute respiratory failure. EXAM: PORTABLE CHEST 1 VIEW COMPARISON:  Chest radiograph dated 05/18/2019 FINDINGS: Bibasilar densities may represent atelectasis or infiltrate appears similar to prior radiograph. There is a focal area of airspace density in the right upper lobe which is also similar to prior radiograph. No large pleural effusion or pneumothorax. There is a background of emphysema and biapical subpleural blebs. The cardiac silhouette is within normal limits. No acute osseous pathology. IMPRESSION: 1. No significant interval change in  the appearance of the lungs compared to prior radiograph of 05/18/2019. 2. Focal area of airspace density in the right upper lobe similar to prior radiograph and CT. Electronically Signed   By: Anner Crete M.D.    On: 05/19/2019 15:40    ASSESSMENT / PLAN:  Acute hypoxic respiratory failure Metastatic lung cancer Acute pulmonary embolism -low clot burden, no RV strain Underlying bullous emphysema  Discussion-hypoxia is quite out of proportion to clot burden so cannot attribute this entirely to PE.  He has diuresed well with Lasix.  He does has bullous emphysema but no bronchospasm.  He now has bibasilar infiltrates and is being treated for aspiration Apparently he never received Keytruda  Recommend -ct Solu-Medrol 60 every 8  -Trial of heated high flow If hypoxia worsens, then can use BiPAP transiently  -DNR/DNI noted  -Continue therapeutic dosing of Lovenox   Discussed with Dr. Burnett Sheng has 100% PD-L1 expression so would be a good candidate for Keytruda, however we will have to wait and see if we can get him to that point  Kara Mead MD. FCCP.  Pulmonary & Critical care  If no response to pager , please call [phone]     05/20/2019, 9:33 AM

## 2019-05-20 NOTE — Evaluation (Signed)
SLP Cancellation Note  Patient Details Name: Justin Randall MRN: [id number] DOB: [birth year]   Cancelled treatment:       Reason Eval/Treat Not Completed: Medical issues which prohibited therapy(pt's RR up to mid 20's with oxygen saturation low 90s, appears very dyspenic, will hold on MBS at this time)  Luanna Salk, [facility] SLP [place] Pager [phone] Office [phone]  Macario Golds 05/20/2019, 7:54 AM

## 2019-05-21 ENCOUNTER — Ambulatory Visit: Payer: Medicare Other

## 2019-05-21 DIAGNOSIS — J9601 Acute respiratory failure with hypoxia: Secondary | ICD-10-CM

## 2019-05-21 DIAGNOSIS — G9341 Metabolic encephalopathy: Secondary | ICD-10-CM

## 2019-05-21 LAB — CBC
HCT: 47.2 % (ref 39.0–52.0)
Hemoglobin: 14.6 g/dL (ref 13.0–17.0)
MCH: 29.9 pg (ref 26.0–34.0)
MCHC: 30.9 g/dL (ref 30.0–36.0)
MCV: 96.7 fL (ref 80.0–100.0)
Platelets: 108 10*3/uL — ABNORMAL LOW (ref 150–400)
RBC: 4.88 MIL/uL (ref 4.22–5.81)
RDW: 16.8 % — ABNORMAL HIGH (ref 11.5–15.5)
WBC: 20.3 10*3/uL — ABNORMAL HIGH (ref 4.0–10.5)
nRBC: 0.6 % — ABNORMAL HIGH (ref 0.0–0.2)

## 2019-05-21 LAB — BASIC METABOLIC PANEL
Anion gap: 16 — ABNORMAL HIGH (ref 5–15)
BUN: 45 mg/dL — ABNORMAL HIGH (ref 6–20)
CO2: 27 mmol/L (ref 22–32)
Calcium: 9.4 mg/dL (ref 8.9–10.3)
Chloride: 115 mmol/L — ABNORMAL HIGH (ref 98–111)
Creatinine, Ser: 0.93 mg/dL (ref 0.61–1.24)
GFR calc Af Amer: >60 mL/min (ref >60)
GFR calc non Af Amer: >60 mL/min (ref >60)
Glucose, Bld: 143 mg/dL — ABNORMAL HIGH (ref 70–99)
Potassium: 3.4 mmol/L — ABNORMAL LOW (ref 3.5–5.1)
Sodium: 158 mmol/L — ABNORMAL HIGH (ref 135–145)

## 2019-05-21 MED ORDER — MORPHINE 100MG IN NS 100ML (1MG/ML) PREMIX INFUSION
1.0000 mg/h | INTRAVENOUS | Status: DC
  Administered 2019-05-21: 1 mg/h via INTRAVENOUS
  Administered 2019-05-22: 07:00:00 4 mg/h via INTRAVENOUS
  Filled 2019-05-21 (×2): qty 100

## 2019-05-21 MED ORDER — POTASSIUM CHLORIDE CRYS ER 20 MEQ PO TBCR
40.0000 meq | EXTENDED_RELEASE_TABLET | Freq: Once | ORAL | Status: DC
  Filled 2019-05-21: qty 2

## 2019-05-21 NOTE — Progress Notes (Signed)
SLP Cancellation Note  Patient Details Name: Justin Randall MRN: [id number] DOB: 01-Dec-1970   Cancelled treatment:       Reason Eval/Treat Not Completed: Other (comment)(note comfort care measures, to sign off at this time)   Macario Golds 05/21/2019, 3:44 PM Luanna Salk, [place] [facility] SLP Acute Rehab Services Pager [phone] Office [phone]

## 2019-05-21 NOTE — Progress Notes (Signed)
PROGRESS NOTE                                                                                                                                                                                                             Patient Demographics:    Justin Randall, is a 48 y.o. male, DOB - [birth year], ONG:[id number]  Admit date - 05/21/2019   Admitting Physician Lavina Hamman, MD  Outpatient Primary MD for the patient is [name], MD  LOS - 5  Outpatient Specialists Dr. Earlie Server  No chief complaint on file.      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  On chemotherapy and plan for radiation.  He presented to the ED with 4 days of right-sided chest pain uncontrolled with home pain medication and associated shortness of breath. Patient found to have left upper and lower lobe small pulmonary embolism without heart strain. Admitted to stepdown unit for acute hypoxic respiratory failure.   Subjective:   Continues to be in respiratory distress.  Now lethargic and poorly arousable.  Assessment  & Plan :       Principal Problem: Acute respiratory failure with hypoxia (HCC) Persistently requiring NRB, placed on BiPAP for some time yesterday but patient did not tolerate and then switched between NRB and high flow nasal cannula. Continues to be tachypneic and hypoxic.  This morning he is poorly arousable and lethargic. After discussing with wife patient made comfort care.  Wife understands and is supportive in making patient comfortable given his guarded and progressively worsened symptoms.  Placed on IV morphine drip and titrate as needed.  Palliative care and PCCM consult appreciated.  Acute pulmonary emboli (HCC) Small left-sided PE.  Discontinue Lovenox as goal for comfort.  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  Patient is not hemodynamically stable to receive radiation therapy as planned. Was placed on IV Solu-Medrol (now discontinued for comfort).  Oncology and PCCM consult appreciated.  Active Problems:   Tobacco dependence   Cancer related pain Low-dose Dilaudid switched to morphine drip.  Titrate as needed.  Protein calorie malnutrition, severe (BMI 18.75 kg/m) Continue nutrition supplement  Generalized weakness and failure to thrive    Overall condition guarded.  Anticipate hospital death.  Family involved in care.  Code Status :DNR, comfort measures  Family Communication  : Wife at bedside  Disposition Plan  : Anticipate hospital death  Barriers For Discharge : Active symptoms.  Consults  : Oncology, PCCM, palliative care  Procedures  : CT angiogram of chest  DVT Prophylaxis  : Therapeutic Lovenox  Lab Results  Component Value Date   PLT 108 (L) 05/21/2019    Antibiotics  :    Anti-infectives (From admission, onward)   Start     Dose/Rate Route Frequency Ordered Stop   05/18/19 1600  Ampicillin-Sulbactam (UNASYN) 3 g in sodium chloride 0.9 % 100 mL IVPB  Status:  Discontinued     3 g 200 mL/hr over 30 Minutes Intravenous Every 6 hours 05/18/19 1535 05/21/19 0907        Objective:   Vitals:   05/21/19 0700 05/21/19 0800 05/21/19 0900 05/21/19 1000  BP: 129/87 (!) 145/100 (!) 133/98 (!) 142/107  Pulse: (!) 109 (!) 112 (!) 120 (!) 118  Resp: 13 16 13 13   Temp:  98.3 F (36.8 C)    TempSrc:  Axillary    SpO2: 96% 94% 93% 94%  Weight:      Height:        Wt Readings from Last 3 Encounters:  05/16/19 66.2 kg  04/27/19 74.6 kg  04/26/19 79.4 kg     Intake/Output Summary (Last 24 hours) at 05/21/2019 1154 Last data filed at 05/21/2019 1100 Gross per 24 hour  Intake 303.05 ml  Output 1875 ml  Net -1571.95 ml   Physical exam Lethargic, difficult to arouse HEENT: Moist mucosa, supple neck Chest: Clear bilaterally CVs: S1-S2  tachycardic GI: Soft, nondistended, nontender Musculoskeletal: Warm, no edema     Data Review:    CBC Recent Labs  Lab 05/17/2019 0159 05/16/19 0901 05/17/19 0242 05/18/19 0224 05/19/19 0210 05/21/19 0211  WBC 23.9* 20.1* 15.4* 15.7* 15.6* 20.3*  HGB 15.8 14.3 12.9* 13.6 13.0 14.6  HCT 48.9 45.3 40.7 41.6 41.3 47.2  PLT 98* 114* 121* 126* 123* 108*  MCV 92.8 95.0 96.2 93.1 95.4 96.7  MCH 30.0 30.0 30.5 30.4 30.0 29.9  MCHC 32.3 31.6 31.7 32.7 31.5 30.9  RDW 14.9 15.6* 15.9* 15.8* 15.9* 16.8*  LYMPHSABS 2.4  --   --   --   --   --   MONOABS 2.0*  --   --   --   --   --   EOSABS 0.0  --   --   --   --   --   BASOSABS 0.1  --   --   --   --   --     Chemistries  Recent Labs  Lab 06/01/2019 0159 05/16/19 0901 05/17/19 0242 05/18/19 0224 05/19/19 0210 05/21/19 0211  NA 142 146* 146* 146* 145 158*  K 4.6 5.9* 3.8 4.1 3.6 3.4*  CL 102 109 114* 111 110 115*  CO2 24 22 21* 23 22 27   GLUCOSE 103* 66* 109* 115* 139* 143*  BUN 40* 29* 25* 25* 29* 45*  CREATININE 1.00 0.99 0.80 0.80 0.75 0.93  CALCIUM 10.3 9.2 8.8* 9.2 9.3 9.4  AST 19 35  --   --   --   --   ALT 22 25  --   --   --   --   ALKPHOS 110 102  --   --   --   --   BILITOT 1.7* 3.1*  --   --   --   --    ------------------------------------------------------------------------------------------------------------------  No results for input(s): CHOL, HDL, LDLCALC, TRIG, CHOLHDL, LDLDIRECT in the last 72 hours.  No results found for: HGBA1C ------------------------------------------------------------------------------------------------------------------ No results for input(s): TSH, T4TOTAL, T3FREE, THYROIDAB in the last 72 hours.  Invalid input(s): FREET3 ------------------------------------------------------------------------------------------------------------------ No results for input(s): VITAMINB12, FOLATE, FERRITIN, TIBC, IRON, RETICCTPCT in the last 72 hours.  Coagulation profile Recent Labs  Lab  05/29/2019 0159  INR 1.3*    No results for input(s): DDIMER in the last 72 hours.  Cardiac Enzymes No results for input(s): CKMB, TROPONINI, MYOGLOBIN in the last 168 hours.  Invalid input(s): CK ------------------------------------------------------------------------------------------------------------------    Component Value Date/Time   BNP 39.7 05/18/2019 0224    Inpatient Medications  Scheduled Meds:  Chlorhexidine Gluconate Cloth  6 each Topical Daily   influenza vac split quadrivalent PF  0.5 mL Intramuscular Tomorrow-1000   mouth rinse  15 mL Mouth Rinse BID   nicotine  21 mg Transdermal Daily   sodium chloride flush  3 mL Intravenous Q12H   Continuous Infusions:  morphine 1 mg/hr (05/21/19 1100)   PRN Meds:.acetaminophen **OR** acetaminophen, bisacodyl, LORazepam, ondansetron **OR** ondansetron (ZOFRAN) IV, phenol, Resource ThickenUp Clear  Micro Results Recent Results (from the past 240 hour(s))  Urine culture     Status: None   Collection Time: 05/27/2019  1:41 AM   Specimen: In/Out Cath Urine  Result Value Ref Range Status   Specimen Description   Final    IN/OUT CATH URINE Performed at [facility], [place] [address], [place], [place] [address]    Special Requests   Final    NONE Performed at [facility], [place] [address], [place], [place] [address]    Culture   Final    NO GROWTH Performed at [facility], [place] [address], [place],  [address]    Report Status 05/16/2019 FINAL  Final  SARS Coronavirus 2 by RT PCR (hospital order, performed in [facility] hospital lab) Nasopharyngeal Nasopharyngeal Swab     Status: None   Collection Time: 06/01/2019  1:42 AM   Specimen: Nasopharyngeal Swab  Result Value Ref Range Status   SARS Coronavirus 2 NEGATIVE NEGATIVE Final    Comment: (NOTE) If result is NEGATIVE SARS-CoV-2 target nucleic acids are NOT DETECTED. The SARS-CoV-2 RNA is generally detectable in  upper and lower  respiratory specimens during the acute phase of infection. The lowest  concentration of SARS-CoV-2 viral copies this assay can detect is 250  copies / mL. A negative result does not preclude SARS-CoV-2 infection  and should not be used as the sole basis for treatment or other  patient management decisions.  A negative result may occur with  improper specimen collection / handling, submission of specimen other  than nasopharyngeal swab, presence of viral mutation(s) within the  areas targeted by this assay, and inadequate number of viral copies  (<250 copies / mL). A negative result must be combined with clinical  observations, patient history, and epidemiological information. If result is POSITIVE SARS-CoV-2 target nucleic acids are DETECTED. The SARS-CoV-2 RNA is generally detectable in upper and lower  respiratory specimens dur ing the acute phase of infection.  Positive  results are indicative of active infection with SARS-CoV-2.  Clinical  correlation with patient history and other diagnostic information is  necessary to determine patient infection status.  Positive results do  not rule out bacterial infection or co-infection with other viruses. If result is PRESUMPTIVE POSTIVE SARS-CoV-2 nucleic acids MAY BE PRESENT.   A  presumptive positive result was obtained on the submitted specimen  and confirmed on repeat testing.  While 2019 novel coronavirus  (SARS-CoV-2) nucleic acids may be present in the submitted sample  additional confirmatory testing may be necessary for epidemiological  and / or clinical management purposes  to differentiate between  SARS-CoV-2 and other Sarbecovirus currently known to infect humans.  If clinically indicated additional testing with an alternate test  methodology [phone]) is advised. The SARS-CoV-2 RNA is generally  detectable in upper and lower respiratory sp ecimens during the acute  phase of infection. The expected result is  Negative. Fact Sheet for Patients:  StrictlyIdeas.no Fact Sheet for Healthcare Providers: BankingDealers.co.za This test is not yet approved or cleared by the [place] FDA and has been authorized for detection and/or diagnosis of SARS-CoV-2 by FDA under an Emergency Use Authorization (EUA).  This EUA will remain in effect (meaning this test can be used) for the duration of the COVID-19 declaration under Section 564(b)(1) of the Act, 21 U.S.C. section 360bbb-3(b)(1), unless the authorization is terminated or revoked sooner. Performed at [facility], [place] [address] [place], [place] [address]   Blood Culture (routine x 2)     Status: None   Collection Time: 05/25/2019  1:59 AM   Specimen: BLOOD RIGHT WRIST  Result Value Ref Range Status   Specimen Description   Final    BLOOD RIGHT WRIST Performed at [facility], [address], [place], [place] [address]    Special Requests   Final    BOTTLES DRAWN AEROBIC AND ANAEROBIC Blood Culture adequate volume Performed at [place] [address] [place], [place] [address]    Culture   Final    NO GROWTH 5 DAYS Performed at [facility], [place] [address] [place], [place] [address]    Report Status 05/20/2019 FINAL  Final  Blood Culture (routine x 2)     Status: None   Collection Time: 05/30/2019  2:00 AM   Specimen: BLOOD LEFT FOREARM  Result Value Ref Range Status   Specimen Description   Final    BLOOD LEFT FOREARM Performed at [facility], [place] [address], [place], [place] [address]    Special Requests   Final    BOTTLES DRAWN AEROBIC AND ANAEROBIC Blood Culture adequate volume Performed at [place] [address], [place], [place] [address]    Culture   Final    NO GROWTH 5 DAYS Performed at [facility], [place] [address], [place], [place] [address]    Report Status 05/20/2019 FINAL  Final    MRSA PCR Screening     Status: None   Collection Time: 05/16/19 11:25 AM   Specimen: Nasal Mucosa; Nasopharyngeal  Result Value Ref Range Status   MRSA by PCR NEGATIVE NEGATIVE Final    Comment:        The GeneXpert MRSA Assay (FDA approved for NASAL specimens only), is one component of a comprehensive MRSA colonization surveillance program. It is not intended to diagnose MRSA infection nor to guide or monitor treatment for MRSA infections. Performed at [facility], [place] [address], [place], [place] [address]     Radiology Reports Ct Angio Chest Pe W And/or Wo Contrast  Result Date: 05/11/2019 CLINICAL DATA:  Per EMS, Pt called EMS for right 9/10 flank pain. Steadily increasing pain for the last 4 days. Pt has stage 4 lung cancer. Last radiation treatment  was 05/11/2019. Pt has prescription for morphine, last took yesterday. Omni 350/.*comment was truncated*^126mL OMNIPAQUE IOHEXOL 350 MG/ML SOLNPE suspected, high pretest prob EXAM: CT ANGIOGRAPHY CHEST WITH CONTRAST TECHNIQUE: Multidetector CT imaging of the chest was performed using the standard protocol during bolus administration of intravenous contrast. Multiplanar CT image reconstructions and MIPs were obtained to evaluate the vascular anatomy. CONTRAST:  14mL OMNIPAQUE IOHEXOL 350 MG/ML SOLN COMPARISON:  04/20/2019, PET-CT FINDINGS: Cardiovascular: A tubular filling defect in the LEFT lower lobe pulmonary artery (image 57/4 consistent with thromboembolus. This is partially occlusive. Small embolus in the LEFT upper lobe pulmonary artery is partially wall adherent (image 37/4). No RIGHT ventricular strain evident. Mediastinum/Nodes: Bulky RIGHT axillary and RIGHT septic clavicular adenopathy again demonstrated. There is a mass of nodal tissue in the RIGHT supraclavicular region measuring 6.5 by 7.3 cm. This intense hypermetabolic on comparison PET scan. The nodal mass appears increased from roughly 5.4 x 5.4 cm. In  the anterior mediastinum large prevascular lymph node measuring 3 cm similar. Small pericardial effusions increased. Lungs/Pleura: RIGHT upper lobe irregular mass measuring 3.6 cm not changed comparison exam. No pneumothorax. Pneumonia. Small focus of ground-glass density in the lateral aspect of the RIGHT middle lobe. Paraseptal emphysema the upper lobes. Upper Abdomen: Limited view of the liver, kidneys, pancreas are unremarkable. Normal adrenal glands. Musculoskeletal: No clear aggressive osseous lesion. Review of the MIP images confirms the above findings. IMPRESSION: 1. Small pulmonary emboli in the LEFT lower lobe and LEFT upper lobe. Overall clot burden mild. 2. Stable RIGHT upper lobe mass. 3. Bulky RIGHT axillary and supraclavicular adenopathy. 4. Interval increase in size of RIGHT supraclavicular nodal mass. 5. Small pericardial effusion increased. Aortic Atherosclerosis (ICD10-I70.0) and Emphysema (ICD10-J43.9). Findings conveyed [facility] on 05/14/2019  at04:51. Electronically Signed   By: Suzy Bouchard M.D.   On: 05/07/2019 04:51   Dg Chest Port 1 View  Result Date: 05/19/2019 CLINICAL DATA:  48 year old male with acute respiratory failure. EXAM: PORTABLE CHEST 1 VIEW COMPARISON:  Chest radiograph dated 05/18/2019 FINDINGS: Bibasilar densities may represent atelectasis or infiltrate appears similar to prior radiograph. There is a focal area of airspace density in the right upper lobe which is also similar to prior radiograph. No large pleural effusion or pneumothorax. There is a background of emphysema and biapical subpleural blebs. The cardiac silhouette is within normal limits. No acute osseous pathology. IMPRESSION: 1. No significant interval change in the appearance of the lungs compared to prior radiograph of 05/18/2019. 2. Focal area of airspace density in the right upper lobe similar to prior radiograph and CT. Electronically Signed   By: Anner Crete M.D.   On: 05/19/2019  15:40   Dg Chest Port 1 View  Result Date: 05/18/2019 CLINICAL DATA:  Dyspnea EXAM: PORTABLE CHEST 1 VIEW COMPARISON:  05/14/2019 FINDINGS: There is new and increased heterogeneous airspace opacity of the bilateral lung bases, particularly the right lung base. There is an unchanged, masslike opacity of the right upper lobe. The heart and mediastinum are unremarkable. IMPRESSION: 1. There is new and increased heterogeneous airspace opacity of the bilateral lung bases, particularly the right lung base, consistent with infection or aspiration. 2. Unchanged right upper lobe masslike opacity. Electronically Signed   By: Eddie Candle M.D.   On: 05/18/2019 09:05   Dg Chest Port 1 View  Result Date: 05/23/2019 CLINICAL DATA:  Flank pain. History of lung cancer. EXAM: PORTABLE CHEST 1 VIEW COMPARISON:  03/22/2019 chest radiograph and 03/30/2019 chest CT FINDINGS: Right apical opacities  are consistent with previously demonstrated cavitary mass. The lungs are otherwise clear. There is bullous emphysema apices. IMPRESSION: 1. No acute cardiopulmonary disease. 2. Unchanged appearance of the right apical cavitary mass. 3. Emphysema. Electronically Signed   By: Ulyses Jarred M.D.   On: 05/31/2019 02:34   Vas [place] Lower Extremity Venous (dvt)  Result Date: 05/18/2019  Lower Venous Study Indications: Edema.  Risk Factors: None identified. Comparison Study: No prior studies. Performing Technologist: Oliver Hum RVT  Examination Guidelines: A complete evaluation includes B-mode imaging, spectral Doppler, color Doppler, and power Doppler as needed of all accessible portions of each vessel. Bilateral testing is considered an integral part of a complete examination. Limited examinations for reoccurring indications may be performed as noted.  +---------+---------------+---------+-----------+----------+--------------+  RIGHT     Compressibility Phasicity Spontaneity Properties Thrombus Aging   +---------+---------------+---------+-----------+----------+--------------+  CFV       Full            Yes       Yes                                    +---------+---------------+---------+-----------+----------+--------------+  SFJ       Full                                                             +---------+---------------+---------+-----------+----------+--------------+  FV Prox   Full                                                             +---------+---------------+---------+-----------+----------+--------------+  FV Mid    Full                                                             +---------+---------------+---------+-----------+----------+--------------+  FV Distal Full                                                             +---------+---------------+---------+-----------+----------+--------------+  PFV       Full                                                             +---------+---------------+---------+-----------+----------+--------------+  POP       Full            Yes       Yes                                    +---------+---------------+---------+-----------+----------+--------------+  PTV       Full                                                             +---------+---------------+---------+-----------+----------+--------------+  PERO      Full                                                             +---------+---------------+---------+-----------+----------+--------------+   +---------+---------------+---------+-----------+----------+--------------+  LEFT      Compressibility Phasicity Spontaneity Properties Thrombus Aging  +---------+---------------+---------+-----------+----------+--------------+  CFV       Full            Yes       Yes                                    +---------+---------------+---------+-----------+----------+--------------+  SFJ       Full                                                              +---------+---------------+---------+-----------+----------+--------------+  FV Prox   Full                                                             +---------+---------------+---------+-----------+----------+--------------+  FV Mid    Full                                                             +---------+---------------+---------+-----------+----------+--------------+  FV Distal Full                                                             +---------+---------------+---------+-----------+----------+--------------+  PFV       Full                                                             +---------+---------------+---------+-----------+----------+--------------+  POP       Full            Yes       Yes                                    +---------+---------------+---------+-----------+----------+--------------+  PTV       Full                                                             +---------+---------------+---------+-----------+----------+--------------+  PERO      Full                                                             +---------+---------------+---------+-----------+----------+--------------+     Summary: Right: There is no evidence of deep vein thrombosis in the lower extremity. No cystic structure found in the popliteal fossa. Left: There is no evidence of deep vein thrombosis in the lower extremity. No cystic structure found in the popliteal fossa.  *See table(s) above for measurements and observations. Electronically signed by Deitra Mayo MD on 05/18/2019 at 3:32:05 PM.    Final     Time Spent in minutes  35   Harris Penton M.D on 05/21/2019 at 11:54 AM  Between 7am to 7pm - Pager - [phone]  After 7pm go to [email or web address] - password [facility]  [facility] -  Office  [phone]

## 2019-05-21 NOTE — Progress Notes (Signed)
Name: Justin Randall MRN: [id number] DOB: November 20, 1970    ADMISSION DATE:  05/07/2019 CONSULTATION DATE:  05/21/2019   REFERRING MD :  Clementeen Graham, MD  CHIEF COMPLAINT: Respiratory distress and hypoxia  BRIEF PATIENT DESCRIPTION: 48 year old man with metastatic non-small cell lung cancer and acute PE with acute hypoxic respiratory failure  SIGNIFICANT EVENTS  10/14 NRB  STUDIES:  CT angios chest 10/10 >> 1. Small pulmonary emboli in the LEFT lower lobe and LEFT upper lobe. Overall clot burden mild. 2. Stable RIGHT upper lobe mass. 3. Bulky RIGHT axillary and supraclavicular adenopathy. 4. Interval increase in size of RIGHT supraclavicular nodal mass. 5. Small pericardial effusion increased   HISTORY OF PRESENT ILLNESS: 48 year old smoker was diagnosed in 04/2019 with stage IV metastatic adenocarcinoma when he presented with right upper lobe lung mass, bilateral mediastinal, supraclavicular axillary and cervical lymphadenopathy as well as left chest wall invasion.  PDL 1 expression was 100%.    He received first cycle of carboplatin and paclitaxel on 9/28 Plan was for palliative radiation to left chest wall mass He was admitted 10/10 with right-sided chest pain and hypoxia initially required 4 L oxygen but rapidly progressed to nonrebreather, he has been hypoxic to this level for the last 2 days.  He was placed on therapeutic Lovenox after finding of pulmonary embolism on CT. We are consulted due to worsening hypoxia and respiratory distress with O2 saturations 87% on nonrebreather.  He was given 40 mg of Lasix and has diuresed 1.4 L Was also started on Unasyn for aspiration pneumonia   SUBJECTIVE:  Remains critically ill requiring nonrebreather Afebrile Less responsive this a.m., has been receiving Dilaudid  VITAL SIGNS: Temp:  [96.8 F (36 C)-98.3 F (36.8 C)] 98.3 F (36.8 C) (10/16 0800) Pulse Rate:  [107-129] 109 (10/16 0700) Resp:  [9-24] 13 (10/16 0700) BP:  (116-173)/(81-127) 129/87 (10/16 0700) SpO2:  [85 %-96 %] 96 % (10/16 0700) FiO2 (%):  [100 %] 100 % (10/15 1113)  PHYSICAL EXAMINATION: Gen.  well-nourished, in moderate distress, on nonrebreather ENT -mild pallor, no icterus Neck: No JVD, no thyromegaly, no carotid bruits , right supraclavicular lymphadenopathy, ulcerative Lungs: + use of accessory muscles, no dullness to percussion, decreased BL with right basal rales no rhonchi , acidotic breathing Cardiovascular: Rhythm regular, heart sounds  normal, no murmurs or gallops, no peripheral edema Abdomen: soft and non-tender, no hepatosplenomegaly, BS normal. Musculoskeletal: No deformities, no cyanosis , clubbing 1+ Neuro: Unresponsive, does not follow commands, flickering movement of eyelids, pinpoint pupils   Chest x-ray 10/14 was personally reviewed and this shows bibasal atelectasis/infiltrate and right upper lobe infiltrate  Recent Labs  Lab 05/18/19 0224 05/19/19 0210 05/21/19 0211  NA 146* 145 158*  K 4.1 3.6 3.4*  CL 111 110 115*  CO2 23 22 27   BUN 25* 29* 45*  CREATININE 0.80 0.75 0.93  GLUCOSE 115* 139* 143*   Recent Labs  Lab 05/18/19 0224 05/19/19 0210 05/21/19 0211  HGB 13.6 13.0 14.6  HCT 41.6 41.3 47.2  WBC 15.7* 15.6* 20.3*  PLT 126* 123* 108*   Dg Chest Port 1 View  Result Date: 05/19/2019 CLINICAL DATA:  48 year old male with acute respiratory failure. EXAM: PORTABLE CHEST 1 VIEW COMPARISON:  Chest radiograph dated 05/18/2019 FINDINGS: Bibasilar densities may represent atelectasis or infiltrate appears similar to prior radiograph. There is a focal area of airspace density in the right upper lobe which is also similar to prior radiograph. No large pleural effusion or pneumothorax. There is a background  of emphysema and biapical subpleural blebs. The cardiac silhouette is within normal limits. No acute osseous pathology. IMPRESSION: 1. No significant interval change in the appearance of the lungs compared  to prior radiograph of 05/18/2019. 2. Focal area of airspace density in the right upper lobe similar to prior radiograph and CT. Electronically Signed   By: Anner Crete M.D.   On: 05/19/2019 15:40    ASSESSMENT / PLAN:  Acute hypoxic respiratory failure Metastatic lung cancer - 100% PD-L1 expression  Acute pulmonary embolism -low clot burden, no RV strain Underlying bullous emphysema  Discussion-hypoxia is quite out of proportion to clot burden so cannot attribute this entirely to PE.  He does has bullous emphysema but no bronchospasm.  He has bibasilar infiltrates and is being treated for aspiration pneumonia.  Unfortunately hypoxia has not improved and he looks worse today Apparently he never received Keytruda  Recommend -drop Solu-Medrol to daily No BiPAP -DNR/DNI noted  -Continue therapeutic dosing of Lovenox  -Unfortunately he looks much worse today and we should progress to comfort care  PCCM available as needed   Kara Mead MD. FCCP. [facility]  If no response to pager , please call [phone]     05/21/2019, 9:28 AM

## 2019-05-21 NOTE — Progress Notes (Signed)
Daily Progress Note   Patient Name: Justin Randall       Date: 05/21/2019 DOB: 1970-08-07  Age: 48 y.o. MRN#: [id number] Attending Physician: Louellen Molder, MD Primary Care Physician: [name], MD Admit Date: 05/12/2019  Reason for Consultation/Follow-up: Establishing goals of care  Subjective: Resting in bed, has NRB mask, events in the past 24 hours notes, discussed with bedside RN as well as with med onc NP.      Length of Stay: 5  Current Medications: Scheduled Meds:  . Chlorhexidine Gluconate Cloth  6 each Topical Daily  . influenza vac split quadrivalent PF  0.5 mL Intramuscular Tomorrow-1000  . mouth rinse  15 mL Mouth Rinse BID  . nicotine  21 mg Transdermal Daily  . sodium chloride flush  3 mL Intravenous Q12H    Continuous Infusions: . morphine      PRN Meds: acetaminophen **OR** acetaminophen, bisacodyl, LORazepam, ondansetron **OR** ondansetron (ZOFRAN) IV, phenol, Resource ThickenUp Clear  Physical Exam         General: Appears sleepy   in moderate respiratory distress, on nonrebreather  HEENT: No bruits, no goiter, no JVD Heart: Tachy. No murmur appreciated. Lungs: Decreased air movement, shallow breaths, coarse Abdomen: Soft, nontender, nondistended, positive bowel sounds.  Ext: No significant edema Skin: Warm and dry, cancer-related changes noted near right clavicle and neck   Vital Signs: BP 129/87   Pulse (!) 109   Temp 98.3 F (36.8 C) (Axillary)   Resp 13   Ht 6\' 2"  (1.88 m)   Wt 66.2 kg   SpO2 96%   BMI 18.75 kg/m  SpO2: SpO2: 96 % O2 Device: O2 Device: High Flow Nasal Cannula O2 Flow Rate: O2 Flow Rate (L/min): 15 L/min  Intake/output summary:   Intake/Output Summary (Last 24 hours) at 05/21/2019 1041 Last data filed at  05/21/2019 0654 Gross per 24 hour  Intake 403 ml  Output 1875 ml  Net -1472 ml   LBM: Last BM Date: 05/14/19 Baseline Weight: Weight: 73.5 kg Most recent weight: Weight: 66.2 kg       Palliative Assessment/Data:      Patient Active Problem List   Diagnosis Date Noted  . Malnutrition of moderate degree 05/19/2019  . Generalized weakness   . Encounter for palliative care   . Acute respiratory failure with  hypoxia ([place]) 05/16/2019  . Pulmonary emboli ([place]) 05/06/2019  . Pressure injury of skin 06/03/2019  . 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 Encounter for antineoplastic immunotherapy 05/03/2019  . Cancer related pain 04/27/2019  . Encounter for antineoplastic chemotherapy 04/15/2019  . Goals of care, counseling/discussion 04/15/2019  . Adenocarcinoma, lung, right ([place]) 04/13/2019  . Tobacco dependence 12/05/2017  . Depression 12/05/2017  . 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 Chronic right-sided low back pain with sciatica 09/11/2016    Palliative Care Assessment & Plan   Patient Profile: 48 y.o. male  with past medical history of metastatic stage IV non-small cell lung cancer who follows with Dr. Earlie Server admitted on 05/09/2019 with right-sided chest pain.  He has had significant pain for the past several weeks and has been mostly bedbound.  This is also prevented him from making it to several appointments.  On arrival, CT angio revealed pulmonary embolism.  He suffered from hypoxic respiratory failure and has transitioned to stepdown.  Palliative consulted for goals of care.  Assessment: Patient Active Problem List   Diagnosis Date Noted  . Malnutrition of moderate degree 05/19/2019  . Generalized weakness   . Encounter for palliative care   . Acute respiratory failure with hypoxia ([place]) 05/16/2019  . Pulmonary emboli ([place]) 05/06/2019  . Pressure injury of skin 05/30/2019  . Secondary malignant neoplasm of cervical lymph node  ([place]) 05/06/2019  . Encounter for antineoplastic immunotherapy 05/03/2019  . Cancer related pain 04/27/2019  . Encounter for antineoplastic chemotherapy 04/15/2019  . Goals of care, counseling/discussion 04/15/2019  . Adenocarcinoma, lung, right ([place]) 04/13/2019  . Tobacco dependence 12/05/2017  . Depression 12/05/2017  . 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 Chronic right-sided low back pain with sciatica 09/11/2016     Recommendations/Plan: - DNR/DNI. - Patient is appropriate for comfort measures only, discussed in detail with med onc NP, agree with Morphine drip.   Anticipated hospital death.   Code Status:    Code Status Orders  (From admission, onward)         Start     Ordered   05/16/19 0822  Do not attempt resuscitation (DNR)  Continuous    Question Answer Comment  In the event of cardiac or respiratory ARREST Do not call a "code blue"   In the event of cardiac or respiratory ARREST Do not perform Intubation, CPR, defibrillation or ACLS   In the event of cardiac or respiratory ARREST Use medication by any route, position, wound care, and other measures to relive pain and suffering. May use oxygen, suction and manual treatment of airway obstruction as needed for comfort.      05/16/19 1027        Code Status History    Date Active Date Inactive Code Status Order ID Comments User Context   06/03/2019 1055 05/16/2019 0821 Full Code [id number]  Lavina Hamman, MD Inpatient   Advance Care Planning Activity       Prognosis:   hours to days.   Discharge Planning:  Anticipated hospital death.   Care plan was discussed with RN, onc NP.  Thank you for allowing the Palliative Medicine Team to assist in the care of this patient.   Time In: 9 Time Out: 9.25 Total Time 25 Prolonged Time Billed No      Greater than 50%  of this time was spent counseling and coordinating care related to the above assessment and plan.  [name], MD [phone] Please contact  Palliative Medicine Team phone at [phone] for questions and concerns.

## 2019-05-21 NOTE — Progress Notes (Signed)
OT Cancellation Note  Patient Details Name: Kelechi Orgeron MRN: [id number] DOB: 1971-07-25   Cancelled Treatment:    Reason Eval/Treat Not Completed: Other (comment) Pt is now comfort care. Maddisen Vought 05/21/2019, 3:49 PM  Lesle Chris, OTR/L Acute Rehabilitation Services [phone] [facility] pager [phone] office 05/21/2019

## 2019-05-21 NOTE — Progress Notes (Signed)
MD made aware of patient's change in neurological status from 05/20/2019 on day shift. Patient responsive to pain, does not follow commands or speak. MD to bedside to assess patient. Will continue to monitor.

## 2019-05-21 NOTE — Progress Notes (Signed)
PT Cancellation Note  Patient Details Name: Christopherjame Carnell MRN: [id number] DOB: [birth year]   Cancelled Treatment:     Unfortunately pt is not doing well and has orders for Comfort Care.  Will consult/update  LPT.       Rica Koyanagi  PTA Acute  Rehabilitation Services Pager      [phone] Office      [phone]

## 2019-05-21 NOTE — Evaluation (Signed)
SLP Cancellation Note  Patient Details Name: Justin Randall MRN: [id number] DOB: [birth year]   Cancelled treatment:       Reason Eval/Treat Not Completed: Other (comment);Medical issues which prohibited therapy(pt with significant increased WOB, using accessory muscles and lethargic)   Macario Golds 05/21/2019, 8:38 AM   Luanna Salk, [facility] SLP Acute Rehab Services Pager [phone] Office [phone]

## 2019-05-21 NOTE — Progress Notes (Signed)
Pt. On comfort measures and did not tolerate Bipap yesterday except for a short time.  Will monitor for need.

## 2019-05-21 NOTE — Progress Notes (Signed)
HEMATOLOGY-ONCOLOGY PROGRESS NOTE  SUBJECTIVE: Events noted.  The patient is transitioning to comfort care.  He will be started on a morphine drip once his wife arrives.  At the time of visit, the patient was unresponsive to my voice and painful stimuli.  On a nonrebreather and having periods of agonal breathing.  Oncology History  Adenocarcinoma, lung, right ([place])  04/13/2019 Initial Diagnosis   Adenocarcinoma, lung, right ([place])   05/03/2019 - 05/03/2019 Chemotherapy   The patient had palonosetron (ALOXI) injection 0.25 mg, 0.25 mg, Intravenous,  Once, 0 of 7 cycles CARBOplatin (PARAPLATIN) 260 mg in sodium chloride 0.9 % 100 mL chemo infusion, 260 mg (100 % of original dose 256.6 mg), Intravenous,  Once, 0 of 7 cycles Dose modification: 256.6 mg (original dose 256.6 mg, Cycle 1) PACLitaxel (TAXOL) 90 mg in sodium chloride 0.9 % 250 mL chemo infusion (</= 80mg /m2), 45 mg/m2 = 90 mg, Intravenous,  Once, 0 of 7 cycles  for chemotherapy treatment.    05/10/2019 -  Chemotherapy   The patient had pembrolizumab (KEYTRUDA) 200 mg in sodium chloride 0.9 % 50 mL chemo infusion, 200 mg, Intravenous, Once, 0 of 8 cycles  for chemotherapy treatment.       REVIEW OF SYSTEMS:   Unable to obtain secondary to patient condition  I have reviewed the past medical history, past surgical history, social history and family history with the patient and they are unchanged from previous note.   PHYSICAL EXAMINATION: ECOG PERFORMANCE STATUS: 3 - Symptomatic, >50% confined to bed  Vitals:   05/21/19 0900 05/21/19 1000  BP: (!) 133/98 (!) 142/107  Pulse: (!) 120 (!) 118  Resp: 13 13  Temp:    SpO2: 93% 94%   Filed Weights   05/19/2019 0018 05/26/2019 1051 05/16/19 1120  Weight: 162 lb (73.5 kg) 141 lb (64 kg) 146 lb (66.2 kg)    Intake/Output from previous day: 10/15 0701 - 10/16 0700 In: 406 [I.V.:6; IV Piggyback:400] Out: 1875 [Urine:1875]  GENERAL: Lethargic, on nonrebreather, periods of agonal  breathing LUNGS: Coarse breath sounds in the anterior lung fields HEART: Tachycardic, no LE edema ABDOMEN:abdomen soft, non-tender and normal bowel sounds Musculoskeletal:no cyanosis of digits and no clubbing  NEURO: Lethargic, unresponsive to voice and painful stimuli  LABORATORY DATA:  I have reviewed the data as listed CMP Latest Ref Rng & Units 05/21/2019 05/19/2019 05/18/2019  Glucose 70 - 99 mg/dL 143(H) 139(H) 115(H)  BUN 6 - 20 mg/dL 45(H) 29(H) 25(H)  Creatinine 0.61 - 1.24 mg/dL 0.93 0.75 0.80  Sodium 135 - 145 mmol/L 158(H) 145 146(H)  Potassium 3.5 - 5.1 mmol/L 3.4(L) 3.6 4.1  Chloride 98 - 111 mmol/L 115(H) 110 111  CO2 22 - 32 mmol/L 27 22 23   Calcium 8.9 - 10.3 mg/dL 9.4 9.3 9.2  Total Protein 6.5 - 8.1 g/dL - - -  Total Bilirubin 0.3 - 1.2 mg/dL - - -  Alkaline Phos 38 - 126 U/L - - -  AST 15 - 41 U/L - - -  ALT 0 - 44 U/L - - -    Lab Results  Component Value Date   WBC 20.3 (H) 05/21/2019   HGB 14.6 05/21/2019   HCT 47.2 05/21/2019   MCV 96.7 05/21/2019   PLT 108 (L) 05/21/2019   NEUTROABS 19.1 (H) 05/30/2019    Ct Angio Chest Pe W And/or Wo Contrast  Result Date: 05/13/2019 CLINICAL DATA:  Per EMS, Pt called EMS for right 9/10 flank pain. Steadily increasing pain  for the last 4 days. Pt has stage 4 lung cancer. Last radiation treatment was 05/11/2019. Pt has prescription for morphine, last took yesterday. Omni 350/.*comment was truncated*^169mL OMNIPAQUE IOHEXOL 350 MG/ML SOLNPE suspected, high pretest prob EXAM: CT ANGIOGRAPHY CHEST WITH CONTRAST TECHNIQUE: Multidetector CT imaging of the chest was performed using the standard protocol during bolus administration of intravenous contrast. Multiplanar CT image reconstructions and MIPs were obtained to evaluate the vascular anatomy. CONTRAST:  15mL OMNIPAQUE IOHEXOL 350 MG/ML SOLN COMPARISON:  04/20/2019, PET-CT FINDINGS: Cardiovascular: A tubular filling defect in the LEFT lower lobe pulmonary artery (image  57/4 consistent with thromboembolus. This is partially occlusive. Small embolus in the LEFT upper lobe pulmonary artery is partially wall adherent (image 37/4). No RIGHT ventricular strain evident. Mediastinum/Nodes: Bulky RIGHT axillary and RIGHT septic clavicular adenopathy again demonstrated. There is a mass of nodal tissue in the RIGHT supraclavicular region measuring 6.5 by 7.3 cm. This intense hypermetabolic on comparison PET scan. The nodal mass appears increased from roughly 5.4 x 5.4 cm. In the anterior mediastinum large prevascular lymph node measuring 3 cm similar. Small pericardial effusions increased. Lungs/Pleura: RIGHT upper lobe irregular mass measuring 3.6 cm not changed comparison exam. No pneumothorax. Pneumonia. Small focus of ground-glass density in the lateral aspect of the RIGHT middle lobe. Paraseptal emphysema the upper lobes. Upper Abdomen: Limited view of the liver, kidneys, pancreas are unremarkable. Normal adrenal glands. Musculoskeletal: No clear aggressive osseous lesion. Review of the MIP images confirms the above findings. IMPRESSION: 1. Small pulmonary emboli in the LEFT lower lobe and LEFT upper lobe. Overall clot burden mild. 2. Stable RIGHT upper lobe mass. 3. Bulky RIGHT axillary and supraclavicular adenopathy. 4. Interval increase in size of RIGHT supraclavicular nodal mass. 5. Small pericardial effusion increased. Aortic Atherosclerosis (ICD10-I70.0) and Emphysema (ICD10-J43.9). Findings conveyed [facility] on 05/21/2019  at04:51. Electronically Signed   By: Suzy Bouchard M.D.   On: 05/14/2019 04:51   Dg Chest Port 1 View  Result Date: 05/19/2019 CLINICAL DATA:  48 year old male with acute respiratory failure. EXAM: PORTABLE CHEST 1 VIEW COMPARISON:  Chest radiograph dated 05/18/2019 FINDINGS: Bibasilar densities may represent atelectasis or infiltrate appears similar to prior radiograph. There is a focal area of airspace density in the right upper lobe  which is also similar to prior radiograph. No large pleural effusion or pneumothorax. There is a background of emphysema and biapical subpleural blebs. The cardiac silhouette is within normal limits. No acute osseous pathology. IMPRESSION: 1. No significant interval change in the appearance of the lungs compared to prior radiograph of 05/18/2019. 2. Focal area of airspace density in the right upper lobe similar to prior radiograph and CT. Electronically Signed   By: Anner Crete M.D.   On: 05/19/2019 15:40   Dg Chest Port 1 View  Result Date: 05/18/2019 CLINICAL DATA:  Dyspnea EXAM: PORTABLE CHEST 1 VIEW COMPARISON:  05/31/2019 FINDINGS: There is new and increased heterogeneous airspace opacity of the bilateral lung bases, particularly the right lung base. There is an unchanged, masslike opacity of the right upper lobe. The heart and mediastinum are unremarkable. IMPRESSION: 1. There is new and increased heterogeneous airspace opacity of the bilateral lung bases, particularly the right lung base, consistent with infection or aspiration. 2. Unchanged right upper lobe masslike opacity. Electronically Signed   By: Eddie Candle M.D.   On: 05/18/2019 09:05   Dg Chest Port 1 View  Result Date: 05/16/2019 CLINICAL DATA:  Flank pain. History of lung cancer. EXAM: PORTABLE CHEST 1  VIEW COMPARISON:  03/22/2019 chest radiograph and 03/30/2019 chest CT FINDINGS: Right apical opacities are consistent with previously demonstrated cavitary mass. The lungs are otherwise clear. There is bullous emphysema apices. IMPRESSION: 1. No acute cardiopulmonary disease. 2. Unchanged appearance of the right apical cavitary mass. 3. Emphysema. Electronically Signed   By: Ulyses Jarred M.D.   On: 05/12/2019 02:34   Vas [place] Lower Extremity Venous (dvt)  Result Date: 05/18/2019  Lower Venous Study Indications: Edema.  Risk Factors: None identified. Comparison Study: No prior studies. Performing Technologist: Oliver Hum RVT   Examination Guidelines: A complete evaluation includes B-mode imaging, spectral Doppler, color Doppler, and power Doppler as needed of all accessible portions of each vessel. Bilateral testing is considered an integral part of a complete examination. Limited examinations for reoccurring indications may be performed as noted.  +---------+---------------+---------+-----------+----------+--------------+ RIGHT    CompressibilityPhasicitySpontaneityPropertiesThrombus Aging +---------+---------------+---------+-----------+----------+--------------+ CFV      Full           Yes      Yes                                 +---------+---------------+---------+-----------+----------+--------------+ SFJ      Full                                                        +---------+---------------+---------+-----------+----------+--------------+ FV Prox  Full                                                        +---------+---------------+---------+-----------+----------+--------------+ FV Mid   Full                                                        +---------+---------------+---------+-----------+----------+--------------+ FV DistalFull                                                        +---------+---------------+---------+-----------+----------+--------------+ PFV      Full                                                        +---------+---------------+---------+-----------+----------+--------------+ POP      Full           Yes      Yes                                 +---------+---------------+---------+-----------+----------+--------------+ PTV      Full                                                        +---------+---------------+---------+-----------+----------+--------------+  PERO     Full                                                        +---------+---------------+---------+-----------+----------+--------------+    +---------+---------------+---------+-----------+----------+--------------+ LEFT     CompressibilityPhasicitySpontaneityPropertiesThrombus Aging +---------+---------------+---------+-----------+----------+--------------+ CFV      Full           Yes      Yes                                 +---------+---------------+---------+-----------+----------+--------------+ SFJ      Full                                                        +---------+---------------+---------+-----------+----------+--------------+ FV Prox  Full                                                        +---------+---------------+---------+-----------+----------+--------------+ FV Mid   Full                                                        +---------+---------------+---------+-----------+----------+--------------+ FV DistalFull                                                        +---------+---------------+---------+-----------+----------+--------------+ PFV      Full                                                        +---------+---------------+---------+-----------+----------+--------------+ POP      Full           Yes      Yes                                 +---------+---------------+---------+-----------+----------+--------------+ PTV      Full                                                        +---------+---------------+---------+-----------+----------+--------------+ PERO     Full                                                        +---------+---------------+---------+-----------+----------+--------------+  Summary: Right: There is no evidence of deep vein thrombosis in the lower extremity. No cystic structure found in the popliteal fossa. Left: There is no evidence of deep vein thrombosis in the lower extremity. No cystic structure found in the popliteal fossa.  *See table(s) above for measurements and observations. Electronically signed by  Deitra Mayo MD on 05/18/2019 at 3:32:05 PM.    Final     ASSESSMENT AND PLAN: 1.  Metastatic non-small cell lung cancer, adenocarcinoma 2.  Acute respiratory failure with hypoxia 3.  Pulmonary emboli 4.  Painful right shoulder metastatic disease  -The patient is now transitioning to comfort measures.  He will be started on morphine drip once his family arrives. -Wife not currently at bedside but is in route to the hospital.  Will return to update her.   Addendum: Wife at the bedside and I spoke with her regarding his current condition and plan moving forward.  She had no questions and is agreeable with pursuing comfort measures.   LOS: 5 days   Mikey Bussing, DNP, AGPCNP-BC, AOCNP 05/21/19

## 2019-05-22 DIAGNOSIS — Z515 Encounter for palliative care: Secondary | ICD-10-CM

## 2019-05-24 ENCOUNTER — Ambulatory Visit: Payer: Medicare Other

## 2019-05-24 ENCOUNTER — Other Ambulatory Visit: Payer: Medicare Other

## 2019-05-25 ENCOUNTER — Ambulatory Visit: Payer: Medicare Other

## 2019-05-26 ENCOUNTER — Ambulatory Visit: Payer: Medicare Other

## 2019-05-27 ENCOUNTER — Ambulatory Visit: Payer: Medicare Other

## 2019-05-28 ENCOUNTER — Ambulatory Visit: Payer: Medicare Other

## 2019-05-31 ENCOUNTER — Encounter: Payer: Medicare Other | Admitting: Nutrition

## 2019-05-31 ENCOUNTER — Ambulatory Visit: Payer: Medicare Other

## 2019-05-31 ENCOUNTER — Other Ambulatory Visit: Payer: Medicare Other

## 2019-05-31 ENCOUNTER — Ambulatory Visit: Payer: Medicare Other | Admitting: Internal Medicine

## 2019-06-01 ENCOUNTER — Ambulatory Visit: Payer: Medicare Other

## 2019-06-02 ENCOUNTER — Ambulatory Visit: Payer: Medicare Other

## 2019-06-03 ENCOUNTER — Ambulatory Visit: Payer: Medicare Other

## 2019-06-04 ENCOUNTER — Ambulatory Visit: Payer: Medicare Other

## 2019-06-06 NOTE — Progress Notes (Signed)
PROGRESS NOTE                                                                                                                                                                                                             Patient Demographics:    Justin Randall, is a 48 y.o. male, DOB - August 15, 1970, TMH:[id number]  Admit date - 05/21/2019   Admitting Physician Lavina Hamman, MD  Outpatient Primary MD for the patient is [name], MD  LOS - 6  Outpatient Specialists Dr. Earlie Server  No chief complaint on file.      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  On chemotherapy and plan for radiation.  He presented to the ED with 4 days of right-sided chest pain uncontrolled with home pain medication and associated shortness of breath. Patient found to have left upper and lower lobe small pulmonary embolism without heart strain. Admitted to stepdown unit for acute hypoxic respiratory failure.   Subjective:   Lethargic with shallow breathing this morning.  Assessment  & Plan :       Principal Problem: Acute respiratory failure with hypoxia (HCC) Progressive symptoms for several days not improved on NRB and high flow nasal cannula.  Became lethargic on 10/16.  After discussion with wife he has been made comfort care.  On IV morphine drip.  Anticipate hospital death.  Transfer to medical floor. Palliative care, oncology and PCCM consult appreciated  Acute pulmonary emboli ([place]) Small left-sided PE.  Discontinue Lovenox as goal for comfort.  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  Not hemodynamically stable for radiation therapy.  Now comfort measures.   Active Problems:   Tobacco dependence   Cancer related pain Now on morphine drip for comfort.  Protein calorie malnutrition, severe (BMI  18.75 kg/m)   Generalized weakness and failure to thrive    Anticipate hospital death.  Family involved in care.   Code Status :DNR, comfort measures  Family Communication  : None at bedside today.  Disposition Plan  : Anticipate hospital death    Consults  : Oncology, PCCM, palliative care  Procedures  : CT angiogram of chest   Lab Results  Component Value Date   PLT 108 (L) 05/21/2019    Antibiotics  :  Anti-infectives (From admission, onward)   Start     Dose/Rate Route Frequency Ordered Stop   05/18/19 1600  Ampicillin-Sulbactam (UNASYN) 3 g in sodium chloride 0.9 % 100 mL IVPB  Status:  Discontinued     3 g 200 mL/hr over 30 Minutes Intravenous Every 6 hours 05/18/19 1535 05/21/19 0907        Objective:   Vitals:   06-18-2019 0100 18-Jun-2019 0200 06-18-19 0300 06/18/2019 0400  BP:      Pulse: (!) 116 (!) 118 (!) 114 (!) 120  Resp: (!) 7 (!) 7 13 10   Temp:      TempSrc:      SpO2: 96% 93% 94% 94%  Weight:      Height:        Wt Readings from Last 3 Encounters:  05/16/19 66.2 kg  04/27/19 74.6 kg  04/26/19 79.4 kg     Intake/Output Summary (Last 24 hours) at 06-18-19 1200 Last data filed at 06-18-2019 1000 Gross per 24 hour  Intake 84.81 ml  Output 675 ml  Net -590.19 ml   Physical exam Lethargic with shallow respiration HEENT: Dry mucosa, supple neck Chest: Clear CVs: S1-S2 normal GI: Soft, nondistended, nontender      Data Review:    CBC Recent Labs  Lab 05/16/19 0901 05/17/19 0242 05/18/19 0224 05/19/19 0210 05/21/19 0211  WBC 20.1* 15.4* 15.7* 15.6* 20.3*  HGB 14.3 12.9* 13.6 13.0 14.6  HCT 45.3 40.7 41.6 41.3 47.2  PLT 114* 121* 126* 123* 108*  MCV 95.0 96.2 93.1 95.4 96.7  MCH 30.0 30.5 30.4 30.0 29.9  MCHC 31.6 31.7 32.7 31.5 30.9  RDW 15.6* 15.9* 15.8* 15.9* 16.8*    Chemistries  Recent Labs  Lab 05/16/19 0901 05/17/19 0242 05/18/19 0224 05/19/19 0210 05/21/19 0211  NA 146* 146* 146* 145 158*  K 5.9*  3.8 4.1 3.6 3.4*  CL 109 114* 111 110 115*  CO2 22 21* 23 22 27   GLUCOSE 66* 109* 115* 139* 143*  BUN 29* 25* 25* 29* 45*  CREATININE 0.99 0.80 0.80 0.75 0.93  CALCIUM 9.2 8.8* 9.2 9.3 9.4  AST 35  --   --   --   --   ALT 25  --   --   --   --   ALKPHOS 102  --   --   --   --   BILITOT 3.1*  --   --   --   --    ------------------------------------------------------------------------------------------------------------------ No results for input(s): CHOL, HDL, LDLCALC, TRIG, CHOLHDL, LDLDIRECT in the last 72 hours.  No results found for: HGBA1C ------------------------------------------------------------------------------------------------------------------ No results for input(s): TSH, T4TOTAL, T3FREE, THYROIDAB in the last 72 hours.  Invalid input(s): FREET3 ------------------------------------------------------------------------------------------------------------------ No results for input(s): VITAMINB12, FOLATE, FERRITIN, TIBC, IRON, RETICCTPCT in the last 72 hours.  Coagulation profile No results for input(s): INR, PROTIME in the last 168 hours.  No results for input(s): DDIMER in the last 72 hours.  Cardiac Enzymes No results for input(s): CKMB, TROPONINI, MYOGLOBIN in the last 168 hours.  Invalid input(s): CK ------------------------------------------------------------------------------------------------------------------    Component Value Date/Time   BNP 39.7 05/18/2019 0224    Inpatient Medications  Scheduled Meds:  Chlorhexidine Gluconate Cloth  6 each Topical Daily   influenza vac split quadrivalent PF  0.5 mL Intramuscular Tomorrow-1000   mouth rinse  15 mL Mouth Rinse BID   nicotine  21 mg Transdermal Daily   sodium chloride flush  3 mL Intravenous Q12H   Continuous  Infusions:  morphine 4.5 mg/hr (June 12, 2019 1000)   PRN Meds:.acetaminophen **OR** acetaminophen, bisacodyl, LORazepam, ondansetron **OR** ondansetron (ZOFRAN) IV, phenol, Resource  ThickenUp Clear  Micro Results Recent Results (from the past 240 hour(s))  Urine culture     Status: None   Collection Time: 05/27/2019  1:41 AM   Specimen: In/Out Cath Urine  Result Value Ref Range Status   Specimen Description   Final    IN/OUT CATH URINE Performed at [facility] [address] [place], [place] [address]    Special Requests   Final    NONE Performed at [facility], [place] [address], [place], [place] [address]    Culture   Final    NO GROWTH Performed at [facility], [place] [address], [place], [place] [address]    Report Status 05/16/2019 FINAL  Final  SARS Coronavirus 2 by RT PCR (hospital order, performed in [facility] hospital lab) Nasopharyngeal Nasopharyngeal Swab     Status: None   Collection Time: 05/12/2019  1:42 AM   Specimen: Nasopharyngeal Swab  Result Value Ref Range Status   SARS Coronavirus 2 NEGATIVE NEGATIVE Final    Comment: (NOTE) If result is NEGATIVE SARS-CoV-2 target nucleic acids are NOT DETECTED. The SARS-CoV-2 RNA is generally detectable in upper and lower  respiratory specimens during the acute phase of infection. The lowest  concentration of SARS-CoV-2 viral copies this assay can detect is 250  copies / mL. A negative result does not preclude SARS-CoV-2 infection  and should not be used as the sole basis for treatment or other  patient management decisions.  A negative result may occur with  improper specimen collection / handling, submission of specimen other  than nasopharyngeal swab, presence of viral mutation(s) within the  areas targeted by this assay, and inadequate number of viral copies  (<250 copies / mL). A negative result must be combined with clinical  observations, patient history, and epidemiological information. If result is POSITIVE SARS-CoV-2 target nucleic acids are DETECTED. The SARS-CoV-2 RNA is generally detectable in upper and lower  respiratory specimens dur ing the  acute phase of infection.  Positive  results are indicative of active infection with SARS-CoV-2.  Clinical  correlation with patient history and other diagnostic information is  necessary to determine patient infection status.  Positive results do  not rule out bacterial infection or co-infection with other viruses. If result is PRESUMPTIVE POSTIVE SARS-CoV-2 nucleic acids MAY BE PRESENT.   A presumptive positive result was obtained on the submitted specimen  and confirmed on repeat testing.  While 2019 novel coronavirus  (SARS-CoV-2) nucleic acids may be present in the submitted sample  additional confirmatory testing may be necessary for epidemiological  and / or clinical management purposes  to differentiate between  SARS-CoV-2 and other Sarbecovirus currently known to infect humans.  If clinically indicated additional testing with an alternate test  methodology [id number]) is advised. The SARS-CoV-2 RNA is generally  detectable in upper and lower respiratory sp ecimens during the acute  phase of infection. The expected result is Negative. Fact Sheet for Patients:  StrictlyIdeas.no Fact Sheet for Healthcare Providers: BankingDealers.co.za This test is not yet approved or cleared by the [place] FDA and has been authorized for detection and/or diagnosis of SARS-CoV-2 by FDA under an Emergency Use Authorization (EUA).  This EUA will remain in effect (meaning this test can be used) for the duration of the COVID-19 declaration under Section 564(b)(1) of the Act, 21  U.S.C. section 360bbb-3(b)(1), unless the authorization is terminated or revoked sooner. Performed at [facility], [place] [address], [place], [place] [address]   Blood Culture (routine x 2)     Status: None   Collection Time: 06/03/2019  1:59 AM   Specimen: BLOOD RIGHT WRIST  Result Value Ref Range Status   Specimen Description   Final    BLOOD RIGHT  WRIST Performed at [facility], [address] [place], [place] [address]    Special Requests   Final    BOTTLES DRAWN AEROBIC AND ANAEROBIC Blood Culture adequate volume Performed at [facility] [address] [place], [place] [address]    Culture   Final    NO GROWTH 5 DAYS Performed at [facility], [place] [address], [place], [place] [address]    Report Status 05/20/2019 FINAL  Final  Blood Culture (routine x 2)     Status: None   Collection Time: 05/30/2019  2:00 AM   Specimen: BLOOD LEFT FOREARM  Result Value Ref Range Status   Specimen Description   Final    BLOOD LEFT FOREARM Performed at [facility], [place] [address] [place], [place] [address]    Special Requests   Final    BOTTLES DRAWN AEROBIC AND ANAEROBIC Blood Culture adequate volume Performed at [place] [address], [place], [place] [address]    Culture   Final    NO GROWTH 5 DAYS Performed at [facility], [place] [address], [place], [place] [address]    Report Status 05/20/2019 FINAL  Final  MRSA PCR Screening     Status: None   Collection Time: 05/16/19 11:25 AM   Specimen: Nasal Mucosa; Nasopharyngeal  Result Value Ref Range Status   MRSA by PCR NEGATIVE NEGATIVE Final    Comment:        The GeneXpert MRSA Assay (FDA approved for NASAL specimens only), is one component of a comprehensive MRSA colonization surveillance program. It is not intended to diagnose MRSA infection nor to guide or monitor treatment for MRSA infections. Performed at [facility], [place] [address], [place], [place] [address]     Radiology Reports Ct Angio Chest Pe W And/or Wo Contrast  Result Date: 05/17/2019 CLINICAL DATA:  Per EMS, Pt called EMS for right 9/10 flank pain. Steadily increasing pain for the last 4 days. Pt has stage 4 lung cancer. Last radiation treatment was 05/11/2019. Pt has prescription for morphine, last took yesterday.  Omni 350/.*comment was truncated*^130mL OMNIPAQUE IOHEXOL 350 MG/ML SOLNPE suspected, high pretest prob EXAM: CT ANGIOGRAPHY CHEST WITH CONTRAST TECHNIQUE: Multidetector CT imaging of the chest was performed using the standard protocol during bolus administration of intravenous contrast. Multiplanar CT image reconstructions and MIPs were obtained to evaluate the vascular anatomy. CONTRAST:  128mL OMNIPAQUE IOHEXOL 350 MG/ML SOLN COMPARISON:  04/20/2019, PET-CT FINDINGS: Cardiovascular: A tubular filling defect in the LEFT lower lobe pulmonary artery (image 57/4 consistent with thromboembolus. This is partially occlusive. Small embolus in the LEFT upper lobe pulmonary artery is partially wall adherent (image 37/4). No RIGHT ventricular strain evident. Mediastinum/Nodes: Bulky RIGHT axillary and RIGHT septic clavicular adenopathy again demonstrated. There is a mass of nodal tissue in the RIGHT supraclavicular region measuring 6.5 by 7.3 cm. This intense hypermetabolic on comparison PET scan. The nodal mass appears increased from roughly 5.4 x 5.4 cm. In the anterior mediastinum large prevascular lymph node measuring 3 cm similar.  Small pericardial effusions increased. Lungs/Pleura: RIGHT upper lobe irregular mass measuring 3.6 cm not changed comparison exam. No pneumothorax. Pneumonia. Small focus of ground-glass density in the lateral aspect of the RIGHT middle lobe. Paraseptal emphysema the upper lobes. Upper Abdomen: Limited view of the liver, kidneys, pancreas are unremarkable. Normal adrenal glands. Musculoskeletal: No clear aggressive osseous lesion. Review of the MIP images confirms the above findings. IMPRESSION: 1. Small pulmonary emboli in the LEFT lower lobe and LEFT upper lobe. Overall clot burden mild. 2. Stable RIGHT upper lobe mass. 3. Bulky RIGHT axillary and supraclavicular adenopathy. 4. Interval increase in size of RIGHT supraclavicular nodal mass. 5. Small pericardial effusion increased. Aortic  Atherosclerosis (ICD10-I70.0) and Emphysema (ICD10-J43.9). Findings conveyed [facility] on 05/25/2019  at04:51. Electronically Signed   By: Suzy Bouchard M.D.   On: 05/26/2019 04:51   Dg Chest Port 1 View  Result Date: 05/19/2019 CLINICAL DATA:  48 year old male with acute respiratory failure. EXAM: PORTABLE CHEST 1 VIEW COMPARISON:  Chest radiograph dated 05/18/2019 FINDINGS: Bibasilar densities may represent atelectasis or infiltrate appears similar to prior radiograph. There is a focal area of airspace density in the right upper lobe which is also similar to prior radiograph. No large pleural effusion or pneumothorax. There is a background of emphysema and biapical subpleural blebs. The cardiac silhouette is within normal limits. No acute osseous pathology. IMPRESSION: 1. No significant interval change in the appearance of the lungs compared to prior radiograph of 05/18/2019. 2. Focal area of airspace density in the right upper lobe similar to prior radiograph and CT. Electronically Signed   By: Anner Crete M.D.   On: 05/19/2019 15:40   Dg Chest Port 1 View  Result Date: 05/18/2019 CLINICAL DATA:  Dyspnea EXAM: PORTABLE CHEST 1 VIEW COMPARISON:  05/12/2019 FINDINGS: There is new and increased heterogeneous airspace opacity of the bilateral lung bases, particularly the right lung base. There is an unchanged, masslike opacity of the right upper lobe. The heart and mediastinum are unremarkable. IMPRESSION: 1. There is new and increased heterogeneous airspace opacity of the bilateral lung bases, particularly the right lung base, consistent with infection or aspiration. 2. Unchanged right upper lobe masslike opacity. Electronically Signed   By: Eddie Candle M.D.   On: 05/18/2019 09:05   Dg Chest Port 1 View  Result Date: 05/08/2019 CLINICAL DATA:  Flank pain. History of lung cancer. EXAM: PORTABLE CHEST 1 VIEW COMPARISON:  03/22/2019 chest radiograph and 03/30/2019 chest CT FINDINGS:  Right apical opacities are consistent with previously demonstrated cavitary mass. The lungs are otherwise clear. There is bullous emphysema apices. IMPRESSION: 1. No acute cardiopulmonary disease. 2. Unchanged appearance of the right apical cavitary mass. 3. Emphysema. Electronically Signed   By: Ulyses Jarred M.D.   On: 05/12/2019 02:34   Vas [place] Lower Extremity Venous (dvt)  Result Date: 05/18/2019  Lower Venous Study Indications: Edema.  Risk Factors: None identified. Comparison Study: No prior studies. Performing Technologist: Oliver Hum RVT  Examination Guidelines: A complete evaluation includes B-mode imaging, spectral Doppler, color Doppler, and power Doppler as needed of all accessible portions of each vessel. Bilateral testing is considered an integral part of a complete examination. Limited examinations for reoccurring indications may be performed as noted.  +---------+---------------+---------+-----------+----------+--------------+  RIGHT     Compressibility Phasicity Spontaneity Properties Thrombus Aging  +---------+---------------+---------+-----------+----------+--------------+  CFV       Full            Yes       Yes                                    +---------+---------------+---------+-----------+----------+--------------+  SFJ       Full                                                             +---------+---------------+---------+-----------+----------+--------------+  FV Prox   Full                                                             +---------+---------------+---------+-----------+----------+--------------+  FV Mid    Full                                                             +---------+---------------+---------+-----------+----------+--------------+  FV Distal Full                                                             +---------+---------------+---------+-----------+----------+--------------+  PFV       Full                                                              +---------+---------------+---------+-----------+----------+--------------+  POP       Full            Yes       Yes                                    +---------+---------------+---------+-----------+----------+--------------+  PTV       Full                                                             +---------+---------------+---------+-----------+----------+--------------+  PERO      Full                                                             +---------+---------------+---------+-----------+----------+--------------+   +---------+---------------+---------+-----------+----------+--------------+  LEFT      Compressibility Phasicity Spontaneity Properties Thrombus Aging  +---------+---------------+---------+-----------+----------+--------------+  CFV       Full            Yes       Yes                                    +---------+---------------+---------+-----------+----------+--------------+  SFJ       Full                                                             +---------+---------------+---------+-----------+----------+--------------+  FV Prox   Full                                                             +---------+---------------+---------+-----------+----------+--------------+  FV Mid    Full                                                             +---------+---------------+---------+-----------+----------+--------------+  FV Distal Full                                                             +---------+---------------+---------+-----------+----------+--------------+  PFV       Full                                                             +---------+---------------+---------+-----------+----------+--------------+  POP       Full            Yes       Yes                                    +---------+---------------+---------+-----------+----------+--------------+  PTV       Full                                                              +---------+---------------+---------+-----------+----------+--------------+  PERO      Full                                                             +---------+---------------+---------+-----------+----------+--------------+     Summary: Right: There is no evidence of deep vein thrombosis in the lower extremity. No cystic structure found in the popliteal fossa. Left: There is no evidence of deep vein thrombosis in the lower extremity. No cystic structure found in the popliteal fossa.  *See table(s) above for measurements and observations. Electronically signed  by Deitra Mayo MD on 05/18/2019 at 3:32:05 PM.    Final     Time Spent in minutes 25   Alazar Cherian M.D on 2019-06-11 at 12:00 PM  Between 7am to 7pm - Pager - [phone]  After 7pm go to [email or web address] - password [facility]  [facility] -  Office  [phone]

## 2019-06-06 NOTE — Discharge Summary (Signed)
Physician Discharge Summary  Justin Randall JYN:[id number] DOB: [birth year] DOA: 05/26/2019  PCP: [name], MD  Admit date: 06/03/2019 Discharge date: 06/01/19  Admitted From: Home  Disposition: Patient expired on 06/01/19 at 12:25 PM   Discharge Diagnoses:  Principal Problem:   Pulmonary emboli ([place])   Acute respiratory failure with hypoxemia (HCC)  Active Problems:   Tobacco dependence   Depression   Adenocarcinoma, lung, right (HCC)   Cancer related pain   Pressure injury of skin   Acute respiratory failure with hypoxia (HCC)   Generalized weakness   Encounter for palliative care   Malnutrition of moderate degree  Brief narrative/HPI 48 year old male with metastatic 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  On chemotherapy and plan for radiation.  He presented to the ED with 4 days of right-sided chest pain uncontrolled with home pain medication and associated shortness of breath. Patient found to have left upper and lower lobe small pulmonary embolism without heart strain. Admitted to stepdown unit for acute hypoxic respiratory failure.    Principal Problem: Acute respiratory failure with hypoxia (HCC) Progressive symptoms for several days not improved on NRB and high flow nasal cannula.  Became lethargic on 10/16.  After discussion with wife he has been made comfort care.    Placed on IV morphine drip with goal for full comfort. Patient expired on 06/01/19 at 12: 25 PM.  Wife was present at bedside.  Acute pulmonary emboli (HCC) Small left-sided PE.    Received therapeutic Lovenox, discontinued after made comfort care.  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    Patient was never hemodynamically stable to receive radiation therapy.        Tobacco dependence   Cancer related pain   Protein calorie malnutrition, severe (BMI 18.75 kg/m)   Generalized weakness and  failure to thrive        Family Communication  :  Wife at bedside      Consults  : Oncology, PCCM, palliative care  Procedures  : CT angiogram of chest   Discharge Instructions   Allergies as of 06/01/2019      Reactions   Fentanyl Nausea And Vomiting   Pt stopped med on his own -  Not feeling self   Hydrocodone Other (See Comments)   Stays awake, cant sleep, decreased appetite   Oxycodone    Nausea, "not myself"      Medication List    ASK your doctor about these medications   fentaNYL 25 MCG/HR Commonly known as: [place] 1 patch onto the skin every 3 (three) days. Rotate sites every application   morphine 30 MG 12 hr tablet Commonly known as: MS CONTIN Take 1 tablet (30 mg total) by mouth every 12 (twelve) hours.   morphine 15 MG tablet Commonly known as: MSIR Take 1 tablet (15 mg total) by mouth every 6 (six) hours as needed for severe pain.       Allergies  Allergen Reactions  . Fentanyl Nausea And Vomiting    Pt stopped med on his own -  Not feeling self  . Hydrocodone Other (See Comments)    Stays awake, cant sleep, decreased appetite  . Oxycodone     Nausea, "not myself"     Procedures/Studies: Ct Angio Chest Pe W And/or Wo Contrast  Result Date: 05/25/2019 CLINICAL DATA:  Per EMS, Pt called EMS for right 9/10 flank pain. Steadily increasing pain for the last 4 days. Pt has  stage 4 lung cancer. Last radiation treatment was 05/11/2019. Pt has prescription for morphine, last took yesterday. Omni 350/.*comment was truncated*^127mL OMNIPAQUE IOHEXOL 350 MG/ML SOLNPE suspected, high pretest prob EXAM: CT ANGIOGRAPHY CHEST WITH CONTRAST TECHNIQUE: Multidetector CT imaging of the chest was performed using the standard protocol during bolus administration of intravenous contrast. Multiplanar CT image reconstructions and MIPs were obtained to evaluate the vascular anatomy. CONTRAST:  175mL OMNIPAQUE IOHEXOL 350 MG/ML SOLN COMPARISON:   04/20/2019, PET-CT FINDINGS: Cardiovascular: A tubular filling defect in the LEFT lower lobe pulmonary artery (image 57/4 consistent with thromboembolus. This is partially occlusive. Small embolus in the LEFT upper lobe pulmonary artery is partially wall adherent (image 37/4). No RIGHT ventricular strain evident. Mediastinum/Nodes: Bulky RIGHT axillary and RIGHT septic clavicular adenopathy again demonstrated. There is a mass of nodal tissue in the RIGHT supraclavicular region measuring 6.5 by 7.3 cm. This intense hypermetabolic on comparison PET scan. The nodal mass appears increased from roughly 5.4 x 5.4 cm. In the anterior mediastinum large prevascular lymph node measuring 3 cm similar. Small pericardial effusions increased. Lungs/Pleura: RIGHT upper lobe irregular mass measuring 3.6 cm not changed comparison exam. No pneumothorax. Pneumonia. Small focus of ground-glass density in the lateral aspect of the RIGHT middle lobe. Paraseptal emphysema the upper lobes. Upper Abdomen: Limited view of the liver, kidneys, pancreas are unremarkable. Normal adrenal glands. Musculoskeletal: No clear aggressive osseous lesion. Review of the MIP images confirms the above findings. IMPRESSION: 1. Small pulmonary emboli in the LEFT lower lobe and LEFT upper lobe. Overall clot burden mild. 2. Stable RIGHT upper lobe mass. 3. Bulky RIGHT axillary and supraclavicular adenopathy. 4. Interval increase in size of RIGHT supraclavicular nodal mass. 5. Small pericardial effusion increased. Aortic Atherosclerosis (ICD10-I70.0) and Emphysema (ICD10-J43.9). Findings conveyed [facility] on 05/08/2019  at04:51. Electronically Signed   By: Suzy Bouchard M.D.   On: 06/01/2019 04:51   Dg Chest Port 1 View  Result Date: 05/19/2019 CLINICAL DATA:  48 year old male with acute respiratory failure. EXAM: PORTABLE CHEST 1 VIEW COMPARISON:  Chest radiograph dated 05/18/2019 FINDINGS: Bibasilar densities may represent atelectasis or  infiltrate appears similar to prior radiograph. There is a focal area of airspace density in the right upper lobe which is also similar to prior radiograph. No large pleural effusion or pneumothorax. There is a background of emphysema and biapical subpleural blebs. The cardiac silhouette is within normal limits. No acute osseous pathology. IMPRESSION: 1. No significant interval change in the appearance of the lungs compared to prior radiograph of 05/18/2019. 2. Focal area of airspace density in the right upper lobe similar to prior radiograph and CT. Electronically Signed   By: Anner Crete M.D.   On: 05/19/2019 15:40   Dg Chest Port 1 View  Result Date: 05/18/2019 CLINICAL DATA:  Dyspnea EXAM: PORTABLE CHEST 1 VIEW COMPARISON:  05/29/2019 FINDINGS: There is new and increased heterogeneous airspace opacity of the bilateral lung bases, particularly the right lung base. There is an unchanged, masslike opacity of the right upper lobe. The heart and mediastinum are unremarkable. IMPRESSION: 1. There is new and increased heterogeneous airspace opacity of the bilateral lung bases, particularly the right lung base, consistent with infection or aspiration. 2. Unchanged right upper lobe masslike opacity. Electronically Signed   By: Eddie Candle M.D.   On: 05/18/2019 09:05   Dg Chest Port 1 View  Result Date: 05/14/2019 CLINICAL DATA:  Flank pain. History of lung cancer. EXAM: PORTABLE CHEST 1 VIEW COMPARISON:  03/22/2019 chest radiograph and  03/30/2019 chest CT FINDINGS: Right apical opacities are consistent with previously demonstrated cavitary mass. The lungs are otherwise clear. There is bullous emphysema apices. IMPRESSION: 1. No acute cardiopulmonary disease. 2. Unchanged appearance of the right apical cavitary mass. 3. Emphysema. Electronically Signed   By: Ulyses Jarred M.D.   On: 05/23/2019 02:34   Vas [place] Lower Extremity Venous (dvt)  Result Date: 05/18/2019  Lower Venous Study Indications: Edema.   Risk Factors: None identified. Comparison Study: No prior studies. Performing Technologist: Oliver Hum RVT  Examination Guidelines: A complete evaluation includes B-mode imaging, spectral Doppler, color Doppler, and power Doppler as needed of all accessible portions of each vessel. Bilateral testing is considered an integral part of a complete examination. Limited examinations for reoccurring indications may be performed as noted.  +---------+---------------+---------+-----------+----------+--------------+ RIGHT    CompressibilityPhasicitySpontaneityPropertiesThrombus Aging +---------+---------------+---------+-----------+----------+--------------+ CFV      Full           Yes      Yes                                 +---------+---------------+---------+-----------+----------+--------------+ SFJ      Full                                                        +---------+---------------+---------+-----------+----------+--------------+ FV Prox  Full                                                        +---------+---------------+---------+-----------+----------+--------------+ FV Mid   Full                                                        +---------+---------------+---------+-----------+----------+--------------+ FV DistalFull                                                        +---------+---------------+---------+-----------+----------+--------------+ PFV      Full                                                        +---------+---------------+---------+-----------+----------+--------------+ POP      Full           Yes      Yes                                 +---------+---------------+---------+-----------+----------+--------------+ PTV      Full                                                        +---------+---------------+---------+-----------+----------+--------------+  PERO     Full                                                         +---------+---------------+---------+-----------+----------+--------------+   +---------+---------------+---------+-----------+----------+--------------+ LEFT     CompressibilityPhasicitySpontaneityPropertiesThrombus Aging +---------+---------------+---------+-----------+----------+--------------+ CFV      Full           Yes      Yes                                 +---------+---------------+---------+-----------+----------+--------------+ SFJ      Full                                                        +---------+---------------+---------+-----------+----------+--------------+ FV Prox  Full                                                        +---------+---------------+---------+-----------+----------+--------------+ FV Mid   Full                                                        +---------+---------------+---------+-----------+----------+--------------+ FV DistalFull                                                        +---------+---------------+---------+-----------+----------+--------------+ PFV      Full                                                        +---------+---------------+---------+-----------+----------+--------------+ POP      Full           Yes      Yes                                 +---------+---------------+---------+-----------+----------+--------------+ PTV      Full                                                        +---------+---------------+---------+-----------+----------+--------------+ PERO     Full                                                        +---------+---------------+---------+-----------+----------+--------------+  Summary: Right: There is no evidence of deep vein thrombosis in the lower extremity. No cystic structure found in the popliteal fossa. Left: There is no evidence of deep vein thrombosis in the lower extremity. No cystic structure found in the popliteal fossa.   *See table(s) above for measurements and observations. Electronically signed by Deitra Mayo MD on 05/18/2019 at 3:32:05 PM.    Final     (Echo, Carotid, EGD, Colonoscopy, ERCP)    Subjective:   Discharge Exam: Vitals:   2019-05-27 0300 05/27/2019 0400  BP:    Pulse: (!) 114 (!) 120  Resp: 13 10  Temp:    SpO2: 94% 94%   Vitals:   05/27/2019 0100 2019/05/27 0200 27-May-2019 0300 05-27-2019 0400  BP:      Pulse: (!) 116 (!) 118 (!) 114 (!) 120  Resp: (!) 7 (!) 7 13 10   Temp:      TempSrc:      SpO2: 96% 93% 94% 94%  Weight:      Height:          The results of significant diagnostics from this hospitalization (including imaging, microbiology, ancillary and laboratory) are listed below for reference.     Microbiology: Recent Results (from the past 240 hour(s))  Urine culture     Status: None   Collection Time: 05/13/2019  1:41 AM   Specimen: In/Out Cath Urine  Result Value Ref Range Status   Specimen Description   Final    IN/OUT CATH URINE Performed at [facility], [place] [address] [place], [place] [address]    Special Requests   Final    NONE Performed at [facility], [place] [address], [place], [place] [address]    Culture   Final    NO GROWTH Performed at [facility], [place] [address], [place],  [address]    Report Status 05/16/2019 FINAL  Final  SARS Coronavirus 2 by RT PCR (hospital order, performed in [facility] hospital lab) Nasopharyngeal Nasopharyngeal Swab     Status: None   Collection Time: 05/20/2019  1:42 AM   Specimen: Nasopharyngeal Swab  Result Value Ref Range Status   SARS Coronavirus 2 NEGATIVE NEGATIVE Final    Comment: (NOTE) If result is NEGATIVE SARS-CoV-2 target nucleic acids are NOT DETECTED. The SARS-CoV-2 RNA is generally detectable in upper and lower  respiratory specimens during the acute phase of infection. The lowest  concentration of SARS-CoV-2 viral copies this assay can detect is 250   copies / mL. A negative result does not preclude SARS-CoV-2 infection  and should not be used as the sole basis for treatment or other  patient management decisions.  A negative result may occur with  improper specimen collection / handling, submission of specimen other  than nasopharyngeal swab, presence of viral mutation(s) within the  areas targeted by this assay, and inadequate number of viral copies  (<250 copies / mL). A negative result must be combined with clinical  observations, patient history, and epidemiological information. If result is POSITIVE SARS-CoV-2 target nucleic acids are DETECTED. The SARS-CoV-2 RNA is generally detectable in upper and lower  respiratory specimens dur ing the acute phase of infection.  Positive  results are indicative of active infection with SARS-CoV-2.  Clinical  correlation with patient history and other diagnostic information is  necessary to determine patient infection status.  Positive results do  not rule out bacterial infection or co-infection with other viruses. If result is PRESUMPTIVE POSTIVE  SARS-CoV-2 nucleic acids MAY BE PRESENT.   A presumptive positive result was obtained on the submitted specimen  and confirmed on repeat testing.  While 2019 novel coronavirus  (SARS-CoV-2) nucleic acids may be present in the submitted sample  additional confirmatory testing may be necessary for epidemiological  and / or clinical management purposes  to differentiate between  SARS-CoV-2 and other Sarbecovirus currently known to infect humans.  If clinically indicated additional testing with an alternate test  methodology [phone]) is advised. The SARS-CoV-2 RNA is generally  detectable in upper and lower respiratory sp ecimens during the acute  phase of infection. The expected result is Negative. Fact Sheet for Patients:  StrictlyIdeas.no Fact Sheet for Healthcare  Providers: BankingDealers.co.za This test is not yet approved or cleared by the [place] FDA and has been authorized for detection and/or diagnosis of SARS-CoV-2 by FDA under an Emergency Use Authorization (EUA).  This EUA will remain in effect (meaning this test can be used) for the duration of the COVID-19 declaration under Section 564(b)(1) of the Act, 21 U.S.C. section 360bbb-3(b)(1), unless the authorization is terminated or revoked sooner. Performed at [facility], [place] [address], [place], [place] [address]   Blood Culture (routine x 2)     Status: None   Collection Time: 05/26/2019  1:59 AM   Specimen: BLOOD RIGHT WRIST  Result Value Ref Range Status   Specimen Description   Final    BLOOD RIGHT WRIST Performed at [facility], [address], [place], [place] [address]    Special Requests   Final    BOTTLES DRAWN AEROBIC AND ANAEROBIC Blood Culture adequate volume Performed at [place] [address] [place], [place] [address]    Culture   Final    NO GROWTH 5 DAYS Performed at [facility], [place] [address] [place], [place] [address]    Report Status 05/20/2019 FINAL  Final  Blood Culture (routine x 2)     Status: None   Collection Time: 06/02/2019  2:00 AM   Specimen: BLOOD LEFT FOREARM  Result Value Ref Range Status   Specimen Description   Final    BLOOD LEFT FOREARM Performed at [facility], [place] [address], [place], [place] [address]    Special Requests   Final    BOTTLES DRAWN AEROBIC AND ANAEROBIC Blood Culture adequate volume Performed at [place] [address], [place], [place] [address]    Culture   Final    NO GROWTH 5 DAYS Performed at [facility], [place] [address], [place], [place] [address]    Report Status 05/20/2019 FINAL  Final  MRSA PCR Screening     Status: None   Collection Time: 05/16/19 11:25 AM   Specimen: Nasal Mucosa;  Nasopharyngeal  Result Value Ref Range Status   MRSA by PCR NEGATIVE NEGATIVE Final    Comment:        The GeneXpert MRSA Assay (FDA approved for NASAL specimens only), is one component of a comprehensive MRSA colonization surveillance program. It is not intended to diagnose MRSA infection nor to guide or monitor treatment for MRSA infections. Performed at [facility], [place] [address] [place],  [address]      Labs: BNP (last 3 results) Recent Labs    05/18/19 0224  BNP 36.1   Basic Metabolic Panel: Recent Labs  Lab 05/18/19 0224 05/19/19 0210 05/21/19 0211  NA 146* 145 158*  K 4.1 3.6 3.4*  CL 111 110 115*  CO2 23 22 27   GLUCOSE 115* 139* 143*  BUN 25* 29* 45*  CREATININE 0.80 0.75 0.93  CALCIUM 9.2 9.3 9.4   Liver Function Tests: No results for input(s): AST, ALT, ALKPHOS, BILITOT, PROT, ALBUMIN in the last 168 hours. No results for input(s): LIPASE, AMYLASE in the last 168 hours. No results for input(s): AMMONIA in the last 168 hours. CBC: Recent Labs  Lab 05/18/19 0224 05/19/19 0210 05/21/19 0211  WBC 15.7* 15.6* 20.3*  HGB 13.6 13.0 14.6  HCT 41.6 41.3 47.2  MCV 93.1 95.4 96.7  PLT 126* 123* 108*   Cardiac Enzymes: No results for input(s): CKTOTAL, CKMB, CKMBINDEX, TROPONINI in the last 168 hours. BNP: Invalid input(s): POCBNP CBG: No results for input(s): GLUCAP in the last 168 hours. D-Dimer No results for input(s): DDIMER in the last 72 hours. Hgb A1c No results for input(s): HGBA1C in the last 72 hours. Lipid Profile No results for input(s): CHOL, HDL, LDLCALC, TRIG, CHOLHDL, LDLDIRECT in the last 72 hours. Thyroid function studies No results for input(s): TSH, T4TOTAL, T3FREE, THYROIDAB in the last 72 hours.  Invalid input(s): FREET3 Anemia work up No results for input(s): VITAMINB12, FOLATE, FERRITIN, TIBC, IRON, RETICCTPCT in the last 72 hours. Urinalysis    Component Value Date/Time   COLORURINE YELLOW  05/20/2019 0141   APPEARANCEUR CLEAR 05/28/2019 0141   LABSPEC 1.044 (H) 05/29/2019 0141   PHURINE 5.0 05/27/2019 0141   GLUCOSEU NEGATIVE 05/16/2019 0141   HGBUR MODERATE (A) 05/28/2019 0141   BILIRUBINUR NEGATIVE 05/18/2019 0141   KETONESUR NEGATIVE 05/06/2019 0141   PROTEINUR NEGATIVE 05/18/2019 0141   NITRITE NEGATIVE 06/01/2019 0141   LEUKOCYTESUR NEGATIVE 05/08/2019 0141   Sepsis Labs Invalid input(s): PROCALCITONIN,  WBC,  LACTICIDVEN Microbiology Recent Results (from the past 240 hour(s))  Urine culture     Status: None   Collection Time: 05/13/2019  1:41 AM   Specimen: In/Out Cath Urine  Result Value Ref Range Status   Specimen Description   Final    IN/OUT CATH URINE Performed at [facility], [place] [address] [place], [place] [address]    Special Requests   Final    NONE Performed at [facility], [place] [address] [place], [place] [address]    Culture   Final    NO GROWTH Performed at [facility], [place] [address] [place], [place] [address]    Report Status 05/16/2019 FINAL  Final  SARS Coronavirus 2 by RT PCR (hospital order, performed in [facility] hospital lab) Nasopharyngeal Nasopharyngeal Swab     Status: None   Collection Time: 06/02/2019  1:42 AM   Specimen: Nasopharyngeal Swab  Result Value Ref Range Status   SARS Coronavirus 2 NEGATIVE NEGATIVE Final    Comment: (NOTE) If result is NEGATIVE SARS-CoV-2 target nucleic acids are NOT DETECTED. The SARS-CoV-2 RNA is generally detectable in upper and lower  respiratory specimens during the acute phase of infection. The lowest  concentration of SARS-CoV-2 viral copies this assay can detect is 250  copies / mL. A negative result does not preclude SARS-CoV-2 infection  and should not be used as the sole basis for treatment or other  patient management decisions.  A negative result may occur with  improper specimen collection / handling, submission of specimen other   than nasopharyngeal swab, presence of viral mutation(s) within the  areas targeted by this assay, and inadequate number of viral copies  (<  250 copies / mL). A negative result must be combined with clinical  observations, patient history, and epidemiological information. If result is POSITIVE SARS-CoV-2 target nucleic acids are DETECTED. The SARS-CoV-2 RNA is generally detectable in upper and lower  respiratory specimens dur ing the acute phase of infection.  Positive  results are indicative of active infection with SARS-CoV-2.  Clinical  correlation with patient history and other diagnostic information is  necessary to determine patient infection status.  Positive results do  not rule out bacterial infection or co-infection with other viruses. If result is PRESUMPTIVE POSTIVE SARS-CoV-2 nucleic acids MAY BE PRESENT.   A presumptive positive result was obtained on the submitted specimen  and confirmed on repeat testing.  While 2019 novel coronavirus  (SARS-CoV-2) nucleic acids may be present in the submitted sample  additional confirmatory testing may be necessary for epidemiological  and / or clinical management purposes  to differentiate between  SARS-CoV-2 and other Sarbecovirus currently known to infect humans.  If clinically indicated additional testing with an alternate test  methodology [phone]) is advised. The SARS-CoV-2 RNA is generally  detectable in upper and lower respiratory sp ecimens during the acute  phase of infection. The expected result is Negative. Fact Sheet for Patients:  StrictlyIdeas.no Fact Sheet for Healthcare Providers: BankingDealers.co.za This test is not yet approved or cleared by the [place] FDA and has been authorized for detection and/or diagnosis of SARS-CoV-2 by FDA under an Emergency Use Authorization (EUA).  This EUA will remain in effect (meaning this test can be used) for the duration of  the COVID-19 declaration under Section 564(b)(1) of the Act, 21 U.S.C. section 360bbb-3(b)(1), unless the authorization is terminated or revoked sooner. Performed at [facility], [place] [address] [place], [place] [address]   Blood Culture (routine x 2)     Status: None   Collection Time: 05/25/2019  1:59 AM   Specimen: BLOOD RIGHT WRIST  Result Value Ref Range Status   Specimen Description   Final    BLOOD RIGHT WRIST Performed at [facility], [address], [place], [place] [address]    Special Requests   Final    BOTTLES DRAWN AEROBIC AND ANAEROBIC Blood Culture adequate volume Performed at [place] [address] [place], [place] [address]    Culture   Final    NO GROWTH 5 DAYS Performed at [facility], [place] [address] [place], [place] [address]    Report Status 05/20/2019 FINAL  Final  Blood Culture (routine x 2)     Status: None   Collection Time: 06/03/2019  2:00 AM   Specimen: BLOOD LEFT FOREARM  Result Value Ref Range Status   Specimen Description   Final    BLOOD LEFT FOREARM Performed at [facility], [place] [address] [place], [place] [address]    Special Requests   Final    BOTTLES DRAWN AEROBIC AND ANAEROBIC Blood Culture adequate volume Performed at [facility] [address] [place], [place] [address]    Culture   Final    NO GROWTH 5 DAYS Performed at [facility], [place] [address] [place], [place] [address]    Report Status 05/20/2019 FINAL  Final  MRSA PCR Screening     Status: None   Collection Time: 05/16/19 11:25 AM   Specimen: Nasal Mucosa; Nasopharyngeal  Result Value Ref Range Status   MRSA by PCR NEGATIVE NEGATIVE Final  Comment:        The GeneXpert MRSA Assay (FDA approved for NASAL specimens only), is one component of a comprehensive MRSA colonization surveillance program. It is not intended to diagnose MRSA infection nor to guide or monitor  treatment for MRSA infections. Performed at [facility], [place] [address], [place], [place] [address]      Time coordinating discharge: 25 minutes  SIGNED:   Louellen Molder, MD  [facility] 05/24/2019, 4:20 PM Pager   If 7PM-7AM, please contact night-coverage [email or web address] Password [email or web address]

## 2019-06-06 NOTE — Progress Notes (Signed)
Daily Progress Note   Patient Name: Justin Randall       Date: 06-02-2019 DOB: [birth year]  Age: 48 y.o. MRN#: [id number] Attending Physician: Louellen Molder, MD Primary Care Physician: [name], MD Admit Date: 05/19/2019  Reason for Consultation/Follow-up: Establishing goals of care  Subjective: Appears comfortable, not awake/alert. Has some apneic spells/sighing respirations. Wife holding vigil at bedside.      Length of Stay: 6  Current Medications: Scheduled Meds:  . Chlorhexidine Gluconate Cloth  6 each Topical Daily  . influenza vac split quadrivalent PF  0.5 mL Intramuscular Tomorrow-1000  . mouth rinse  15 mL Mouth Rinse BID  . nicotine  21 mg Transdermal Daily  . sodium chloride flush  3 mL Intravenous Q12H    Continuous Infusions: . morphine 4.5 mg/hr (Jun 02, 2019 1000)    PRN Meds: acetaminophen **OR** acetaminophen, bisacodyl, LORazepam, ondansetron **OR** ondansetron (ZOFRAN) IV, phenol, Resource ThickenUp Clear  Physical Exam         General: Appears sleepy   in moderate respiratory distress, on nonrebreather  HEENT: No bruits, no goiter, no JVD Heart: Tachy. No murmur appreciated. Lungs: Decreased air movement, shallow breaths, coarse Abdomen: Soft, nontender, nondistended, positive bowel sounds.  Ext: No significant edema Skin: Warm and dry, cancer-related changes noted near right clavicle and neck essentially unresponsive.   Vital Signs: BP (!) 130/95 (BP Location: Left Arm)   Pulse (!) 120   Temp 98.3 F (36.8 C) (Axillary)   Resp 10   Ht 6\' 2"  (1.88 m)   Wt 66.2 kg   SpO2 94%   BMI 18.75 kg/m  SpO2: SpO2: 94 % O2 Device: O2 Device: High Flow Nasal Cannula O2 Flow Rate: O2 Flow Rate (L/min): 7 L/min  Intake/output summary:    Intake/Output Summary (Last 24 hours) at 06/02/19 1212 Last data filed at 06/02/2019 1000 Gross per 24 hour  Intake 84.81 ml  Output 675 ml  Net -590.19 ml   LBM: Last BM Date: 05/14/19 Baseline Weight: Weight: 73.5 kg Most recent weight: Weight: 66.2 kg       Palliative Assessment/Data:      Patient Active Problem List   Diagnosis Date Noted  . Acute respiratory failure with hypoxemia ([place])   . Malnutrition of moderate degree 05/19/2019  . Generalized weakness   . Encounter for palliative  care   . Acute respiratory failure with hypoxia ([place]) 05/16/2019  . Pulmonary emboli ([place]) 05/28/2019  . Pressure injury of skin 05/21/2019  . 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 Encounter for antineoplastic immunotherapy 05/03/2019  . Cancer related pain 04/27/2019  . Encounter for antineoplastic chemotherapy 04/15/2019  . Goals of care, counseling/discussion 04/15/2019  . Adenocarcinoma, lung, right ([place]) 04/13/2019  . Tobacco dependence 12/05/2017  . Depression 12/05/2017  . 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 Chronic right-sided low back pain with sciatica 09/11/2016    Palliative Care Assessment & Plan   Patient Profile: 48 y.o. male  with past medical history of metastatic stage IV non-small cell lung cancer who follows with Dr. Earlie Server admitted on 05/12/2019 with right-sided chest pain.  He has had significant pain for the past several weeks and has been mostly bedbound.  This is also prevented him from making it to several appointments.  On arrival, CT angio revealed pulmonary embolism.  He suffered from hypoxic respiratory failure and has transitioned to stepdown.  Palliative consulted for goals of care.  Assessment: Patient Active Problem List   Diagnosis Date Noted  . Acute respiratory failure with hypoxemia ([place])   . Malnutrition of moderate degree 05/19/2019  . Generalized weakness   . Encounter for palliative care   . Acute  respiratory failure with hypoxia ([place]) 05/16/2019  . Pulmonary emboli ([place]) 05/25/2019  . Pressure injury of skin 05/11/2019  . 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 Encounter for antineoplastic immunotherapy 05/03/2019  . Cancer related pain 04/27/2019  . Encounter for antineoplastic chemotherapy 04/15/2019  . Goals of care, counseling/discussion 04/15/2019  . 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 Tobacco dependence 12/05/2017  . Depression 12/05/2017  . 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 Chronic right-sided low back pain with sciatica 09/11/2016     Recommendations/Plan: - DNR/DNI. - Patient is appropriate for comfort measures only, discussed in detail with med onc NP on 05-21-2019, agree with Morphine drip.   Anticipated hospital death. Discussed with wife at bedside this am.   Code Status:    Code Status Orders  (From admission, onward)         Start     Ordered   05/16/19 0822  Do not attempt resuscitation (DNR)  Continuous    Question Answer Comment  In the event of cardiac or respiratory ARREST Do not call a "code blue"   In the event of cardiac or respiratory ARREST Do not perform Intubation, CPR, defibrillation or ACLS   In the event of cardiac or respiratory ARREST Use medication by any route, position, wound care, and other measures to relive pain and suffering. May use oxygen, suction and manual treatment of airway obstruction as needed for comfort.      05/16/19 3875        Code Status History    Date Active Date Inactive Code Status Order ID Comments User Context   05/23/2019 1055 05/16/2019 0821 Full Code [id number]  Lavina Hamman, MD Inpatient   Advance Care Planning Activity       Prognosis:   hours to days.   Discharge Planning:  Anticipated hospital death.   Care plan was discussed with wife Thank you for allowing the Palliative Medicine Team to assist in the care of this patient.   Time In:  9 Time Out: 9.25 Total Time 25 Prolonged Time Billed No      Greater than 50%  of this time was spent counseling and coordinating care related to the above assessment and plan.  [name], MD [phone] Please contact Palliative Medicine Team phone at [phone] for questions and concerns.

## 2019-06-06 NOTE — Progress Notes (Signed)
Pt's wife was bedside when I arrived. She held pt's hand during most of our visit. She said her daughter is otw. She talked about their "strong" faith and she she said she knows where he is. Drawing upon her faith, I offered empathic spiritual support with prayer, and care of presence and listening support. Please page if additional support is needed as other family member(s) arrive. [place], MDiv   06/14/2019 1300  Clinical Encounter Type  Visited With Family

## 2019-06-06 NOTE — Progress Notes (Signed)
70 ml of morphine sulfate wasted in stericycle- leftover from pt morphine drip.  Petra Kuba, RN witnessed waste.

## 2019-06-06 NOTE — Progress Notes (Signed)
Physical Therapy Discharge Patient Details Name: Justin Randall MRN: [id number] DOB: 11-Apr-1971 Today's Date: May 28, 2019 Time:  -     Patient discharged from PT services secondary to medical decline - will need to re-order PT to resume therapy services.  Please see latest therapy progress note for current level of functioning and progress toward goals.    Progress and discharge plan discussed with patient and/or caregiver: per palliative care, comfort measures.  Pt with anticipated hospital death.       Kasondra Junod,KATHrine E 2019/05/28, 11:43 AM  Carmelia Bake, PT, DPT Acute Rehabilitation Services Office: [phone] Pager: [phone]

## 2019-06-06 DEATH — deceased

## 2019-06-07 ENCOUNTER — Ambulatory Visit: Payer: Medicare Other

## 2019-06-07 ENCOUNTER — Other Ambulatory Visit: Payer: Medicare Other

## 2019-06-08 ENCOUNTER — Ambulatory Visit: Payer: Medicare Other

## 2019-06-09 ENCOUNTER — Ambulatory Visit: Payer: Medicare Other

## 2019-06-21 ENCOUNTER — Other Ambulatory Visit: Payer: Medicare Other

## 2019-06-21 ENCOUNTER — Ambulatory Visit: Payer: Medicare Other | Admitting: Physician Assistant

## 2019-06-21 ENCOUNTER — Ambulatory Visit: Payer: Medicare Other

## 2019-06-23 NOTE — Progress Notes (Signed)
  Radiation Oncology         [phone] ________________________________  Name: Justin Randall MRN: [id number]  Date: 05/14/2019  DOB: 02-10-1971  End of Treatment Note  Diagnosis:   Bone metastasis     Indication for treatment::  palliative       Radiation treatment dates:   05/03/19 - 05/06/19  Site/dose:   The patient was planned to receive 37.5 Gy in 15 fractions to the right SCLV. He discontinued XRT after 2 fractions.    Narrative: The patient tolerated radiation treatment but decided to discontinue XRT  Plan: The patient has completed radiation treatment for now. We would be happy to see him in the future if necessary. ________________________________  Jodelle Gross, M.D., Ph.D.

## 2019-07-12 ENCOUNTER — Ambulatory Visit: Payer: Medicare Other | Admitting: Internal Medicine

## 2019-07-12 ENCOUNTER — Ambulatory Visit: Payer: Medicare Other

## 2019-07-12 ENCOUNTER — Other Ambulatory Visit: Payer: Medicare Other

## 2020-10-04 IMAGING — CT CT ABDOMEN AND PELVIS WITH CONTRAST
2 of 5 series · 13 of 46 positions shown, 15 images · IV contrast (omnipaque)
Comparison: Neck CT 03/28/2019.  Chest radiograph 03/22/2019.

CLINICAL DATA: Cervical and upper thoracic adenopathy on neck CT.
Right-sided neck pain and swelling.

EXAM:
CT CHEST, ABDOMEN, AND PELVIS WITH CONTRAST
TECHNIQUE: Multidetector CT imaging of the chest, abdomen and pelvis was
performed following the standard protocol during bolus
administration of intravenous contrast.
CONTRAST:  100mL OMNIPAQUE IOHEXOL 300 MG/ML  SOLN

[Series 3: cap with · axial · 0.67mm/px · z∈[+989,+1539]mm · 10 of 134 slices shown, 12 images]
[im 12/134  soft-tissue]
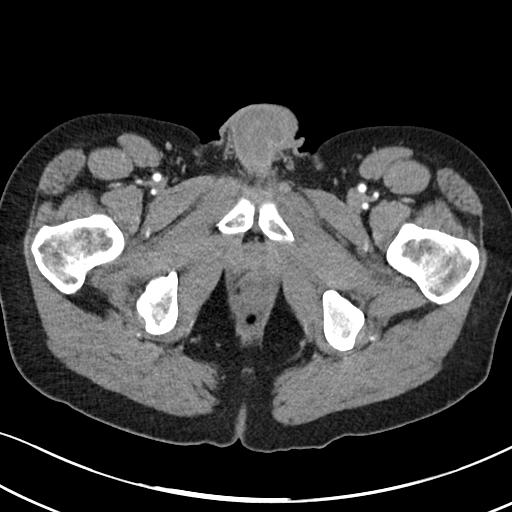
[im 12/134  bone]
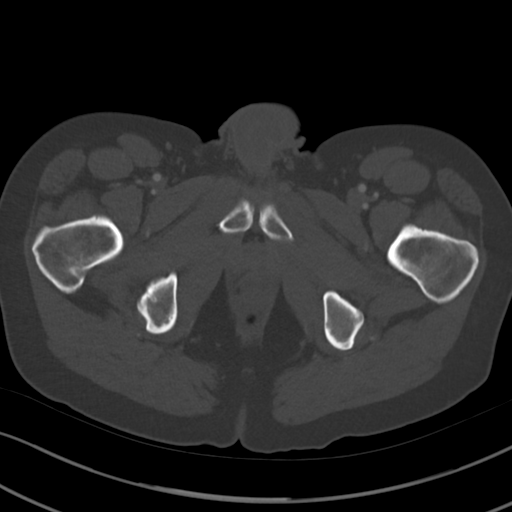
[im 23/134  soft-tissue]
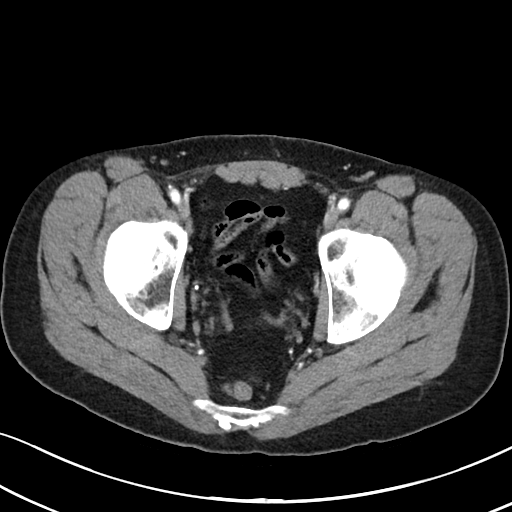
[im 34/134  soft-tissue]
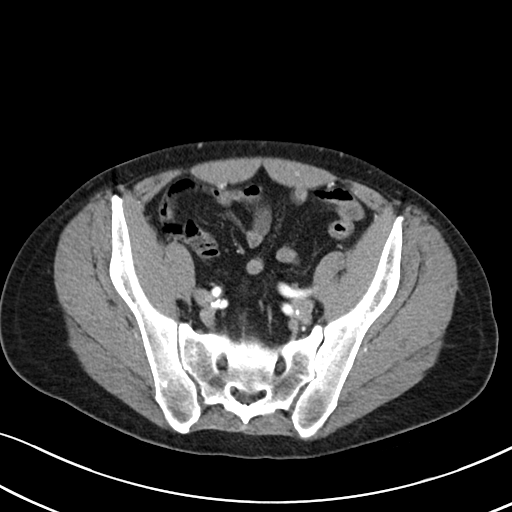
[im 45/134  soft-tissue]
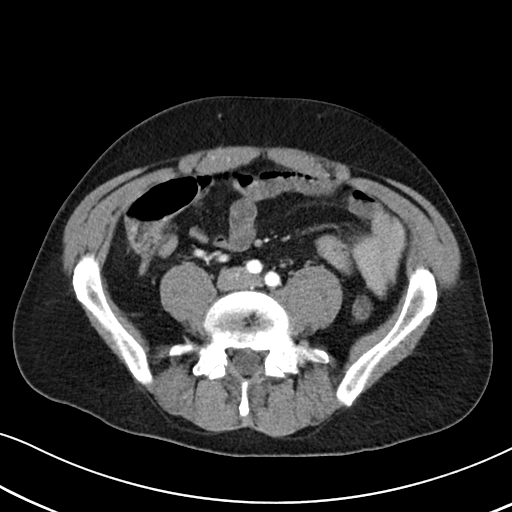
[im 56/134  soft-tissue]
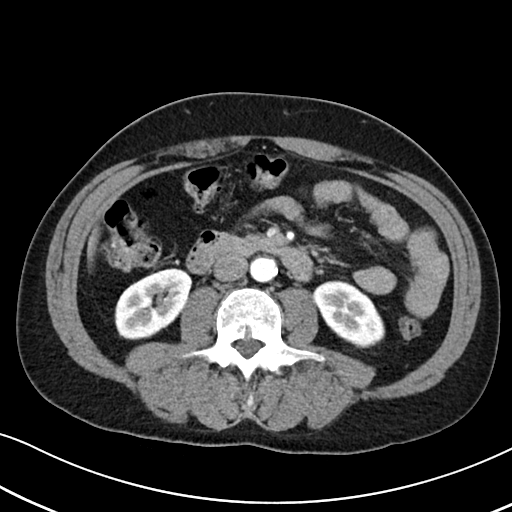
[im 78/134  soft-tissue]
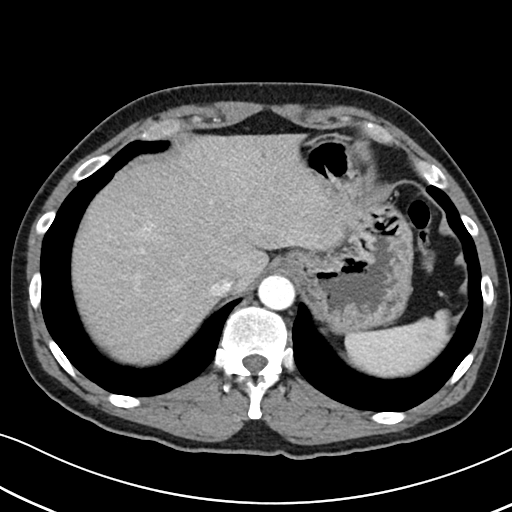
[im 89/134  soft-tissue]
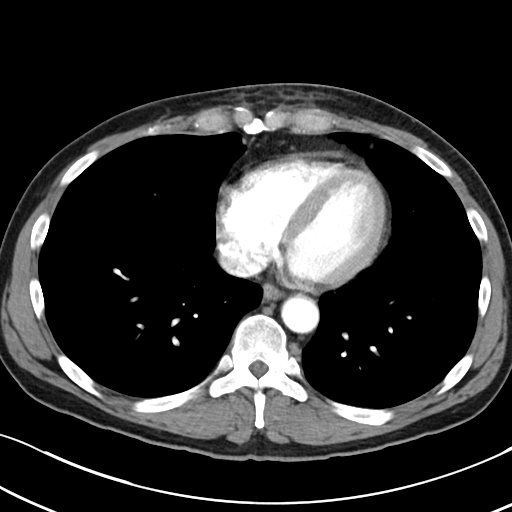
[im 100/134  soft-tissue]
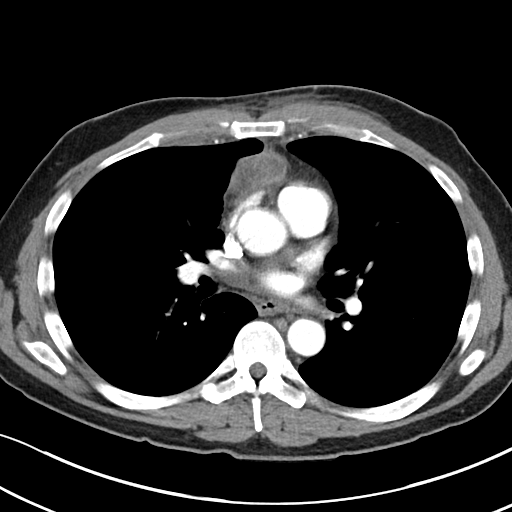
[im 111/134  soft-tissue]
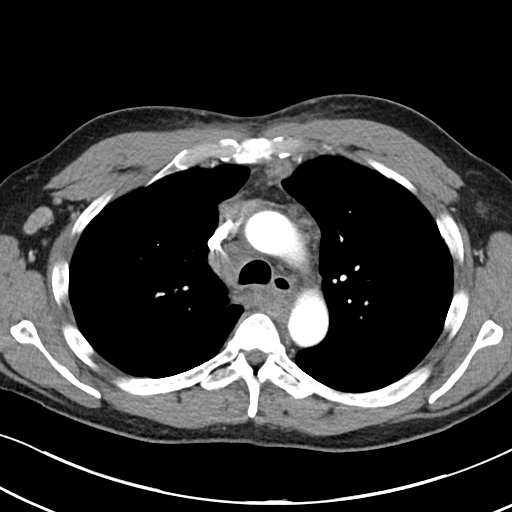
[im 111/134  bone]
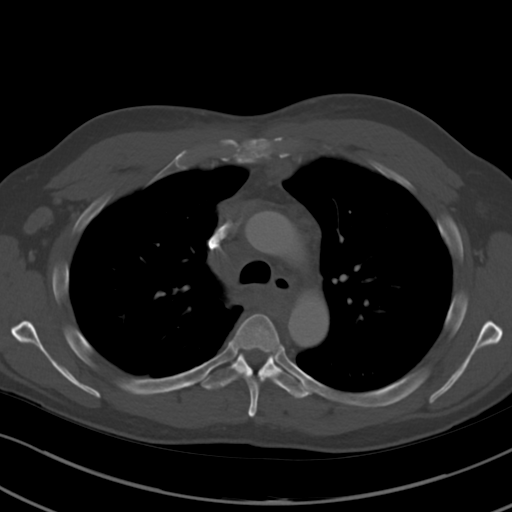
[im 122/134  soft-tissue]
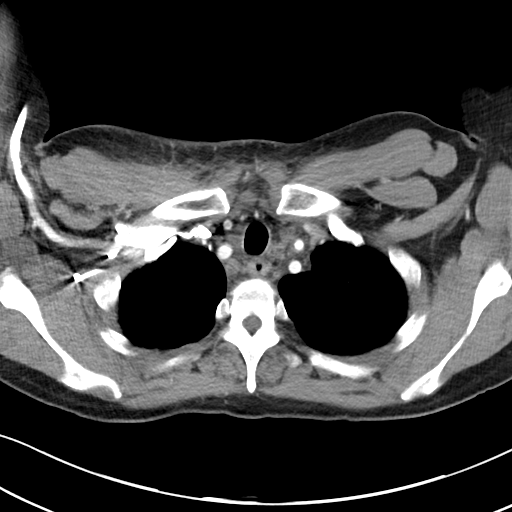

[Series 6: cor · coronal · 0.71mm/px · 3 of 99 slices shown]
[im 33/99  soft-tissue]
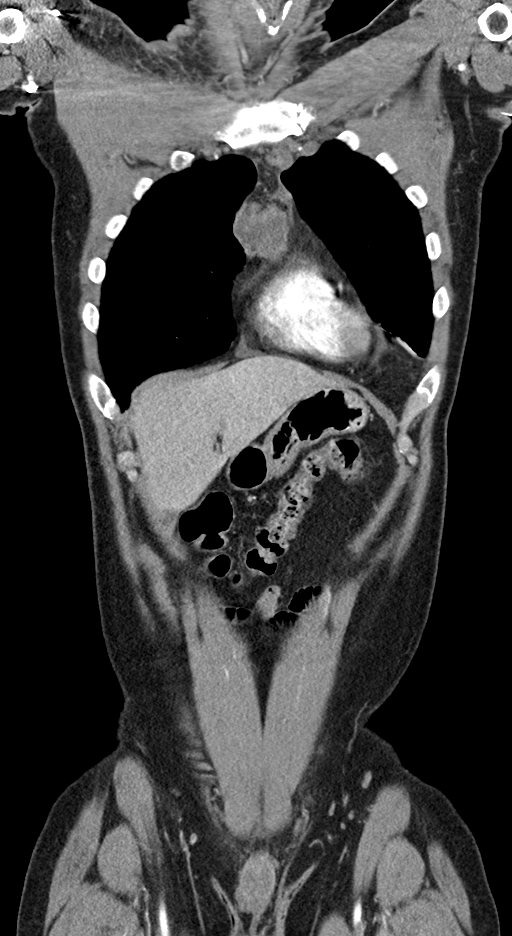
[im 44/99  soft-tissue]
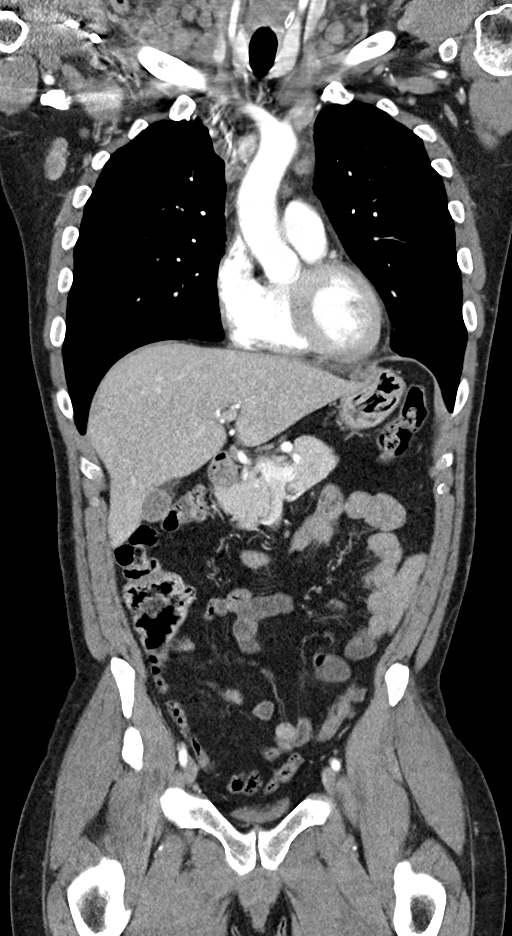
[im 55/99  soft-tissue]
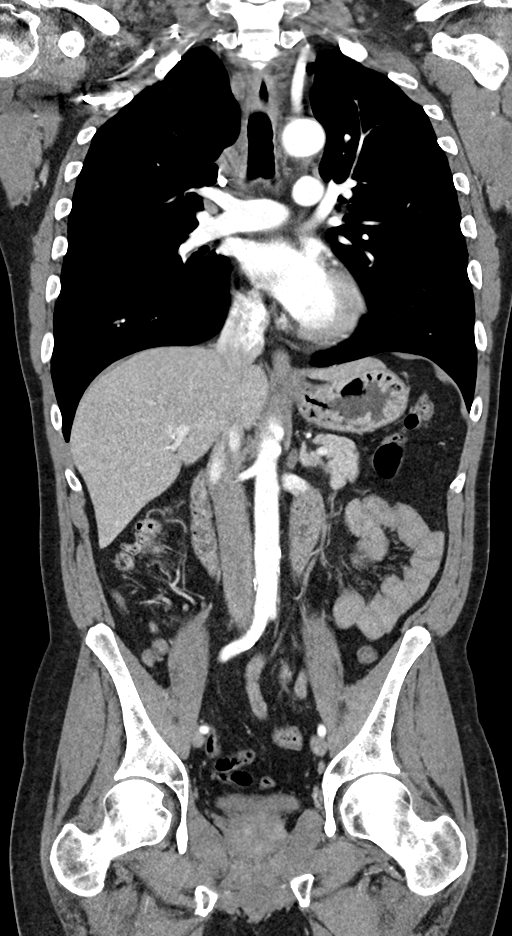

[13 of 46 positions shown; findings below may reference images not displayed]

FINDINGS: CT CHEST FINDINGS

Cardiovascular: Aortic atherosclerosis. Normal heart size, without
pericardial effusion. No central pulmonary embolism, on this
non-dedicated study.

Mediastinum/Nodes: Supraclavicular adenopathy as detailed on prior
dedicated CT.

1.0 cm right axillary node on [DATE], borderline sized.

Right paratracheal node measures 1.7 cm on [DATE].

Borderline size right hilar node of 1.1 cm on [DATE].

Periesophageal adenopathy including at 1.9 cm on [DATE].

Necrotic nodal mass within the anterior mediastinum including at
x 4.4 cm on 34/3.

Lungs/Pleura: No pleural fluid. Bullous emphysema, most significant
in the apices.

Right apical centrally cavitary lung mass measures 4.3 x 3.1 cm on
40/15. 3.2 cm craniocaudal on coronal image 69.

Extension or satellite lesion medially including at 9 mm on 49/5.

Musculoskeletal: Metallic foreign bodies about the left chest wall.
Anterior chest wall subcutaneous edema, especially about the
anterior left sternum on 34/3.

CT ABDOMEN PELVIS FINDINGS

Hepatobiliary: Focal steatosis adjacent the falciform ligament. Too
small to characterize 2 mm lesion in segment 2 of the liver. Normal
gallbladder, without biliary ductal dilatation.

Pancreas: Normal, without mass or ductal dilatation.

Spleen: Normal in size, without focal abnormality.

Adrenals/Urinary Tract: Normal adrenal glands. Normal kidneys,
without hydronephrosis. Normal urinary bladder.

Stomach/Bowel: Normal stomach, without wall thickening. Normal
colon, appendix, and terminal ileum. Normal small bowel.

Vascular/Lymphatic: Advanced aortic and branch vessel
atherosclerosis. No abdominopelvic adenopathy.

Reproductive: Normal prostate.

Other: No significant free fluid. No evidence of omental or
peritoneal disease.

Musculoskeletal: Lumbosacral spondylosis.
IMPRESSION: CT CHEST IMPRESSION

1. Right apical lung mass, highly suspicious for primary
bronchogenic carcinoma. Given location and morphology, tuberculosis
could look similar.
2. Extensive thoracic adenopathy, most consistent with metastatic
disease.
3. Aortic atherosclerosis (S8I8I-[id number].N) and emphysema (S8I8I-682.2).
4. Anterior chest wall subcutaneous edema is nonspecific. Question
cellulitis. Consider physical exam correlation.

CT ABDOMEN AND PELVIS IMPRESSION

No acute process or evidence of metastatic disease in the abdomen or
pelvis.
